# Patient Record
Sex: Female | Born: 1945 | Race: White | Hispanic: No | Marital: Married | Smoking: Former smoker
Health system: Southern US, Community
[De-identification: ages and names within clinical notes are randomized; demographics above are authoritative.]

## PROBLEM LIST (undated history)

## (undated) DIAGNOSIS — G4730 Sleep apnea, unspecified: Secondary | ICD-10-CM

## (undated) DIAGNOSIS — I959 Hypotension, unspecified: Secondary | ICD-10-CM

## (undated) DIAGNOSIS — I729 Aneurysm of unspecified site: Secondary | ICD-10-CM

## (undated) DIAGNOSIS — G20A1 Parkinson's disease without dyskinesia, without mention of fluctuations: Secondary | ICD-10-CM

## (undated) DIAGNOSIS — G20 Parkinson's disease: Secondary | ICD-10-CM

## (undated) DIAGNOSIS — H409 Unspecified glaucoma: Secondary | ICD-10-CM

## (undated) HISTORY — PX: CRANIOTOMY: SHX93

## (undated) HISTORY — PX: OTHER SURGICAL HISTORY: SHX169

## (undated) HISTORY — DX: Parkinson's disease: G20

## (undated) HISTORY — DX: Parkinson's disease without dyskinesia, without mention of fluctuations: G20.A1

---

## 1998-09-13 ENCOUNTER — Other Ambulatory Visit: Admission: RE | Admit: 1998-09-13 | Discharge: 1998-09-13 | Payer: Self-pay | Admitting: Obstetrics and Gynecology

## 1999-09-23 ENCOUNTER — Other Ambulatory Visit: Admission: RE | Admit: 1999-09-23 | Discharge: 1999-09-23 | Payer: Self-pay | Admitting: Obstetrics and Gynecology

## 2000-10-21 ENCOUNTER — Other Ambulatory Visit: Admission: RE | Admit: 2000-10-21 | Discharge: 2000-10-21 | Payer: Self-pay | Admitting: Obstetrics and Gynecology

## 2006-08-05 ENCOUNTER — Ambulatory Visit: Payer: Self-pay | Admitting: Gastroenterology

## 2006-09-07 ENCOUNTER — Ambulatory Visit: Payer: Self-pay | Admitting: Gastroenterology

## 2006-09-07 ENCOUNTER — Encounter (INDEPENDENT_AMBULATORY_CARE_PROVIDER_SITE_OTHER): Payer: Self-pay | Admitting: Specialist

## 2008-03-02 ENCOUNTER — Ambulatory Visit: Payer: Self-pay | Admitting: Obstetrics and Gynecology

## 2008-03-02 ENCOUNTER — Other Ambulatory Visit: Admission: RE | Admit: 2008-03-02 | Discharge: 2008-03-02 | Payer: Self-pay | Admitting: Obstetrics and Gynecology

## 2008-03-02 ENCOUNTER — Encounter: Payer: Self-pay | Admitting: Obstetrics and Gynecology

## 2009-11-14 ENCOUNTER — Emergency Department (HOSPITAL_COMMUNITY): Admission: EM | Admit: 2009-11-14 | Discharge: 2009-11-14 | Payer: Self-pay | Admitting: Family Medicine

## 2011-06-30 ENCOUNTER — Encounter: Payer: Self-pay | Admitting: Gastroenterology

## 2012-03-30 ENCOUNTER — Encounter: Payer: Self-pay | Admitting: Gastroenterology

## 2012-12-06 ENCOUNTER — Other Ambulatory Visit: Payer: Self-pay

## 2013-07-30 ENCOUNTER — Encounter (HOSPITAL_COMMUNITY): Payer: Self-pay | Admitting: Emergency Medicine

## 2013-07-30 ENCOUNTER — Emergency Department (HOSPITAL_COMMUNITY): Payer: Medicare PPO

## 2013-07-30 ENCOUNTER — Emergency Department (HOSPITAL_COMMUNITY)
Admission: EM | Admit: 2013-07-30 | Discharge: 2013-07-30 | Disposition: A | Discharge status: Home / Self Care | Payer: Medicare PPO | Attending: Emergency Medicine | Admitting: Emergency Medicine

## 2013-07-30 DIAGNOSIS — W010XXA Fall on same level from slipping, tripping and stumbling without subsequent striking against object, initial encounter: Secondary | ICD-10-CM | POA: Insufficient documentation

## 2013-07-30 DIAGNOSIS — Z880 Allergy status to penicillin: Secondary | ICD-10-CM | POA: Insufficient documentation

## 2013-07-30 DIAGNOSIS — H409 Unspecified glaucoma: Secondary | ICD-10-CM | POA: Insufficient documentation

## 2013-07-30 DIAGNOSIS — Z79899 Other long term (current) drug therapy: Secondary | ICD-10-CM | POA: Insufficient documentation

## 2013-07-30 DIAGNOSIS — Y939 Activity, unspecified: Secondary | ICD-10-CM | POA: Insufficient documentation

## 2013-07-30 DIAGNOSIS — W1809XA Striking against other object with subsequent fall, initial encounter: Secondary | ICD-10-CM | POA: Insufficient documentation

## 2013-07-30 DIAGNOSIS — H538 Other visual disturbances: Secondary | ICD-10-CM | POA: Insufficient documentation

## 2013-07-30 DIAGNOSIS — Y929 Unspecified place or not applicable: Secondary | ICD-10-CM | POA: Insufficient documentation

## 2013-07-30 DIAGNOSIS — Z8679 Personal history of other diseases of the circulatory system: Secondary | ICD-10-CM | POA: Insufficient documentation

## 2013-07-30 DIAGNOSIS — F0781 Postconcussional syndrome: Secondary | ICD-10-CM | POA: Insufficient documentation

## 2013-07-30 HISTORY — DX: Aneurysm of unspecified site: I72.9

## 2013-07-30 HISTORY — DX: Unspecified glaucoma: H40.9

## 2013-07-30 NOTE — ED Notes (Signed)
Dr. Bednar at bedside. 

## 2013-07-30 NOTE — ED Notes (Signed)
Patient transported to X-ray 

## 2013-07-30 NOTE — ED Notes (Signed)
Pt states slipped and fell yesterday landing and hitting posterior head.  No LOC.  Pt complains of tenderness to neck.  Pt reports sore head but no headache.  No vomiting.  Has history of aneurysm  With repair in 1982.  Pt reports started having intermittent blurred vision since falling yesterday.  Pt states when right eye open only she has blurry/double vision..  No vision deficit now.  No other deficits

## 2013-07-30 NOTE — ED Provider Notes (Signed)
CSN: [id number]     Arrival date & time 07/30/13  1430 History   First MD Initiated Contact with Patient 07/30/13 2053     Chief Complaint  Patient presents with  . Fall     (Consider location/radiation/quality/duration/timing/severity/associated sxs/prior Treatment) HPI Larey Seat yesterday hit head since then only when looks down and right has slight blurred vision if both eyes open (not with one eye at a time), without headache, neck pain, blindness, scotomas, or loss of peripheral vision. Also no back pain, weak, numb, incoordination, vertigo, CP, SOB, abdominal pain, nausea, vomiting, change in bowel/bladder function, difficulty walking, lacerations, or other concerns. No change in speech, swallow, understanding. Symptoms mild. No treatment PTA. Past Medical History  Diagnosis Date  . Aneurysm     brain with repair  . Glaucoma    Past Surgical History  Procedure Laterality Date  . Brain surgery    . Metal plate in head     No family history on file. History  Substance Use Topics  . Smoking status: Never Smoker   . Smokeless tobacco: Not on file  . Alcohol Use: Yes     Comment: occ   OB History   Grav Para Term Preterm Abortions TAB SAB Ect Mult Living                 Review of Systems 10 Systems reviewed and are negative for acute change except as noted in the HPI.   Allergies  Penicillins  Home Medications   Current Outpatient Rx  Name  Route  Sig  Dispense  Refill  . bimatoprost (LUMIGAN) 0.01 % SOLN   Both Eyes   Place 1 drop into both eyes at bedtime.         Marland Kitchen glucosamine-chondroitin 500-400 MG tablet   Oral   Take 1 tablet by mouth daily.         . Multiple Vitamins-Minerals (MULTIVITAMIN PO)   Oral   Take 1 tablet by mouth daily.         . naproxen sodium (ANAPROX) 220 MG tablet   Oral   Take 220 mg by mouth daily as needed (for pain).         . Omega-3 Fatty Acids (FISH OIL) 1000 MG CAPS   Oral   Take 1,000 mg by mouth daily.          . simvastatin (ZOCOR) 40 MG tablet   Oral   Take 40 mg by mouth daily.         . timolol (TIMOPTIC) 0.5 % ophthalmic solution   Both Eyes   Place 1 drop into both eyes every morning.         . zolpidem (AMBIEN) 5 MG tablet   Oral   Take 5 mg by mouth at bedtime as needed for sleep.          BP 140/67  Pulse 78  Temp(Src) 97.4 F (36.3 C) (Oral)  Resp 23  SpO2 96% Physical Exam  Nursing note and vitals reviewed. Constitutional:  Awake, alert, nontoxic appearance with baseline speech for patient.  HENT:  Mouth/Throat: No oropharyngeal exudate.  Minimally tender occipital scalp  Eyes: EOM are normal. Pupils are equal, round, and reactive to light. Right eye exhibits no discharge. Left eye exhibits no discharge.  Only when looks down and right with both eyes describes slight vague blurred vision without nystagmus or dysconjugate gaze noted  Neck: Neck supple.  Cardiovascular: Normal rate and regular rhythm.   No  murmur heard. Pulmonary/Chest: Effort normal and breath sounds normal. No stridor. No respiratory distress. She has no wheezes. She has no rales. She exhibits no tenderness.  Abdominal: Soft. Bowel sounds are normal. She exhibits no mass. There is no tenderness. There is no rebound.  Musculoskeletal: She exhibits no tenderness.  Baseline ROM, moves extremities with no obvious new focal weakness.  Lymphadenopathy:    She has no cervical adenopathy.  Neurological: She is alert.  Awake, alert, cooperative and aware of situation; motor strength bilaterally; sensation normal to light touch bilaterally; peripheral visual fields full to confrontation; no facial asymmetry; tongue midline; major cranial nerves appear intact; no pronator drift, normal finger to nose bilaterally, baseline gait without new ataxia.  Skin: No rash noted.  Psychiatric: She has a normal mood and affect.    ED Course  Procedures (including critical care time) D/w Neuro rec OutPt f/u. Patient /  Family / Caregiver informed of clinical course, understand medical decision-making process, and agree with plan.Pt stable in ED with no significant deterioration in condition. Labs Review Labs Reviewed - No data to display Imaging Review No results found.  EKG Interpretation   None       MDM   Final diagnoses:  Postconcussive syndrome    I doubt any other EMC precluding discharge at this time including, but not necessarily limited to the following:detached retina, ICH.    Hurman HornJohn M Monroe Toure, MD 08/02/13 [phone]

## 2013-07-30 NOTE — Discharge Instructions (Signed)
Concussion, Adult °A concussion is a brain injury. It is caused by: °· A hit to the head. °· A quick and sudden movement (jolt) of the head or neck. °A concussion is usually not life-threatening. Even so, it can cause serious problems. If you had a concussion before, you may have concussion-like problems after a hit to your head. °HOME CARE °General Instructions °· Follow your doctor's directions carefully. °· Take medicines only as told by your doctor. °· Only take medicines your doctor says are safe. °· Do not drink alcohol until your doctor says it is OK. Alcohol and some drugs can slow down healing. They can also put you at risk for further injury. °· If your are having trouble remembering things, write them down. °· Try to do one thing at a time if you get distracted easily. For example, do not watch TV while making dinner. °· Talk to your family members or close friends when making important decisions. °· Follow up with your doctor as told. °· Watch your symptoms. Tell others to do the same. Serious problems can sometimes happen after a concussion. Older adults are more likely to have these problems. °· Tell your teachers, school nurse, school counselor, coach, athletic trainer, or work manager about your concussion. Tell them about what you can or cannot do. They should watch to see if: °· It gets even harder for you to pay attention or concentrate. °· It gets even harder for you to remember things or learn new things. °· You need more time than normal to finish things. °· You become annoyed (irritable) more than before. °· You are not able to deal with stress as well. °· You have more problems than before. °· Rest. Make sure you: °· Get plenty of sleep at night. °· Go to sleep early. °· Go to bed at the same time every day. Try to wake up at the same time. °· Rest during the day. °· Take naps when you feel tired. °· Limit activities where you have to think a lot or concentrate. These include: °· Doing  homework. °· Doing work related to a job. °· Watching TV. °· Using the computer. °Returning To Your Regular Activities °Return to your normal activities slowly, not all at once. You must give your body and brain enough time to heal.  °· Do not play sports or do other athletic activities until your doctor says it is OK. °· Ask your doctor when you can drive, ride a bicycle, or work other vehicles or machines. Never do these things if you feel dizzy. °· Ask your doctor about when you can return to work or school. °Preventing Another Concussion °It is very important to avoid another brain injury, especially before you have healed. In rare cases, another injury can lead to permanent brain damage, brain swelling, or death. The risk of this is greatest during the first 7 10 after your injury. Avoid injuries by:  °· Wearing a seat belt when riding in a car. °· Not drinking too much alcohol. °· Avoiding activities that could lead to a second concussion (such as contact sports). °· Wearing a helmet when doing activities like: °· Biking. °· Skiing. °· Skateboarding. °· Skating. °· Making your home safer by: °· Removing things from the floor or stairways that could make you trip. °· Using grab bars in bathrooms and handrails by stairs. °· Placing non-slip mats on floors and in bathtubs. °· Improve lighting in dark areas. °GET HELP IF: °· It   gets even harder for you to pay attention or concentrate. °· It gets even harder for you to remember things or learn new things. °· You need more time than normal to finish things. °· You become annoyed (irritable) more than before. °· You are not able to deal with stress as well. °· You have more problems than before. °· You have problems keeping your balance. °· You are not able to react quickly when you should. °Get help if you have any of these problems for more than 2 weeks:  °· Lasting (chronic) headaches. °· Dizziness or trouble balancing. °· Feeling sick to your stomach  (nausea). °· Seeing (vision) problems. °· Being affected by noises or light more than normal. °· Feeling sad, low, down in the dumps, blue, gloomy, or empty (depressed). °· Mood changes (mood swings). °· Feeling of fear or nervousness about what may happen (anxiety). °· Feeling annoyed. °· Memory problems. °· Problems concentrating or paying attention. °· Sleep problems. °· Feeling tired all the time. °GET HELP RIGHT AWAY IF:  °· You have bad headaches or your headaches get worse. °· You have weakness (even if it is in one hand, leg, or part of the face). °· You have loss of feeling (numbness). °· You feel off balance. °· You keep throwing up (vomiting). °· You feel tired. °· One black center of your eye (pupil) is larger than the other. °· You twitch or shake violently (convulse). °· Your speech is not clear (slurred). °· You are more confused, easily angered (agitated), or annoyed than before. °· You have more trouble resting than before. °· You are unable to recognize people or places. °· You have neck pain. °· It is difficult to wake you up. °· You have unusual behavior changes. °· You pass out (lose consciousness). °MAKE SURE YOU:  °· Understand these instructions. °· Will watch your condition. °· Will get help right away if you are not doing well or get worse. °Document Released: 05/14/2009 Document Revised: 03/16/2013 Document Reviewed: 12/16/2012 °ExitCare® Patient Information ©2014 [employer]. °You have had a head injury which does not appear to require admission at this time. A concussion is a state of changed mental ability from trauma. °SEEK IMMEDIATE MEDICAL ATTENTION IF: °There is confusion or drowsiness (although children frequently become drowsy after injury).  °You cannot awaken the injured person.  °There is nausea (feeling sick to your stomach) or continued, forceful vomiting.  °You notice dizziness or unsteadiness which is getting worse, or inability to walk.  °You have convulsions or  unconsciousness.  °You experience severe, persistent headaches not relieved by Tylenol?. (Do not take aspirin as this impairs clotting abilities). Take other pain medications only as directed.  °You cannot use arms or legs normally.  °There are changes in pupil sizes. (This is the black center in the colored part of the eye)  °There is clear or bloody discharge from the nose or ears.  °Change in speech, vision, swallowing, or understanding.  °Localized weakness, numbness, tingling, or change in bowel or bladder control. °

## 2013-07-30 NOTE — ED Notes (Signed)
Pt placed in [place] collar for neck soreness

## 2013-09-20 ENCOUNTER — Encounter: Payer: Self-pay | Admitting: Neurology

## 2013-09-20 ENCOUNTER — Ambulatory Visit (INDEPENDENT_AMBULATORY_CARE_PROVIDER_SITE_OTHER): Payer: Medicare PPO | Admitting: Neurology

## 2013-09-20 VITALS — BP 128/70 | HR 68 | Resp 18 | Ht 68.0 in | Wt 165.8 lb

## 2013-09-20 DIAGNOSIS — G20 Parkinson's disease: Secondary | ICD-10-CM

## 2013-09-20 DIAGNOSIS — G20A1 Parkinson's disease without dyskinesia, without mention of fluctuations: Secondary | ICD-10-CM

## 2013-09-20 MED ORDER — CARBIDOPA-LEVODOPA 25-100 MG PO TABS: 1.0000 | ORAL_TABLET | Freq: Three times a day (TID) | ORAL | Status: DC

## 2013-09-20 NOTE — Patient Instructions (Signed)
1. Start Carbidopa-Levodopa as follows: 1/2 tab three times a day before meals x 1 wk, then 1/2 in am & noon & 1 in evening for a week, then 1/2 in am &1 at noon &one in evening for a week, then 1 tablet three times a day before meals 2. You have been referred to Neuro Rehab. They will call you directly to schedule an appointment.  Please call [phone] if you do not hear from them.  3. Follow up 8 weeks.  

## 2013-09-20 NOTE — Progress Notes (Signed)
Elizabeth Mcintosh was seen today in the movement disorders clinic for neurologic consultation at the request of [name],[name], MD.  The consultation is for the evaluation of abnormal gait and "awkwardness" and weakness on the R side.  Pt states that they were away in [place] and [place] in October, 2014 and she felt like she just didn't bounce back after that.  She just felt "slow" after.  In Jan, she went to ortho to get an injection in her shoulder b/c of an impingement syndrome but still felt slow after PT, although she did good in PT.  She tried to d/c all the meds that she thought that she thought would interfere and could cause slowness - benadryl and unisom.  She feels that her right arm doesn't swing.  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  Specific Symptoms:  Tremor: no Voice: no change Sleep: sleeps well with [place]  Vivid Dreams:  no  Acting out dreams:  no Wet Pillows: no Postural symptoms:  no  Falls?  no (one fall only and it was on ice) Bradykinesia symptoms: slow movements and difficulty getting out of a chair Loss of smell:  no Loss of taste:  no Urinary Incontinence:  no Difficulty Swallowing:  no Handwriting, micrographia: no Trouble with ADL's:  no  (able to do them but slower)  Trouble buttoning clothing: no Depression:  no Memory changes:  no Hallucinations:  no  visual distortions: no N/V:  no Lightheaded:  no  Syncope: no Diplopia:  no Dyskinesia:  no  Neuroimaging has  previously been performed.  It is available for my review today.  CT of the brain was done on 07/30/13 demonstrating encephalomalacia in the R frontal region and prior aneurysm clips.   Pt reports that the aneursym happened when she was pregnant and it ruptured.  PREVIOUS MEDICATIONS: none to date  ALLERGIES:   Allergies  Allergen Reactions  . Penicillins     *childhood allergy*    CURRENT MEDICATIONS:  Current Outpatient Prescriptions on File Prior to Visit   Medication Sig Dispense Refill  . bimatoprost (LUMIGAN) 0.01 % SOLN Place 1 drop into both eyes at bedtime.      Marland Kitchen glucosamine-chondroitin 500-400 MG tablet Take 1 tablet by mouth daily.      . Multiple Vitamins-Minerals (MULTIVITAMIN PO) Take 1 tablet by mouth daily.      . Omega-3 Fatty Acids (FISH OIL) 1000 MG CAPS Take 1,000 mg by mouth daily.      . timolol (TIMOPTIC) 0.5 % ophthalmic solution Place 1 drop into both eyes every morning.      . zolpidem (AMBIEN) 5 MG tablet Take 5 mg by mouth at bedtime as needed for sleep.      . naproxen sodium (ANAPROX) 220 MG tablet Take 220 mg by mouth daily as needed (for pain).      . simvastatin (ZOCOR) 40 MG tablet Take 40 mg by mouth daily.       No current facility-administered medications on file prior to visit.    PAST MEDICAL HISTORY:   Past Medical History  Diagnosis Date  . Aneurysm     s/p clips  . Glaucoma     PAST SURGICAL HISTORY:   Past Surgical History  Procedure Laterality Date  . Craniotomy    . Aneurym clipping      SOCIAL HISTORY:   History   Social History  . Marital Status: Married  Spouse Name: N/A    Number of Children: N/A  . Years of Education: N/A   Occupational History  . retired     Copycurator, [employer]   Social History Main Topics  . Smoking status: Former Games developermoker  . Smokeless tobacco: Not on file     Comment: quit 40 years ago  . Alcohol Use: Yes     Comment: 2-3 glasses wine/day (6-8 oz in each glass)  . Drug Use: No  . Sexual Activity: Not on file   Other Topics Concern  . Not on file   Social History Narrative  . No narrative on file    FAMILY HISTORY:   Family Status  Relation Status Death Age  . Mother Deceased [id number]    fall  . Father Deceased [id number]    CAD, renal failure  . Brother Deceased     CVA, MI after hip replacement  . Brother Alive     bladder CA  . Child Alive     healthy    ROS:  A complete 10 system review of systems was obtained and was unremarkable apart  from what is mentioned above.  PHYSICAL EXAMINATION:    VITALS:   Filed Vitals:   09/20/13 0949  BP: 128/70  Pulse: 68  Resp: 18  Height: 5\' 8"  (1.727 m)  Weight: 165 lb 12.8 oz (75.206 kg)    GEN:  The patient appears stated age and is in NAD. HEENT:  Normocephalic, atraumatic.  The mucous membranes are moist. The superficial temporal arteries are without ropiness or tenderness. CV:  RRR Lungs:  CTAB Neck/HEME:  There are no carotid bruits bilaterally.  Neurological examination:  Orientation: The patient is alert and oriented x3. Fund of knowledge is appropriate.  Recent and remote memory are intact.  Attention and concentration are normal.    Able to name objects and repeat phrases. Cranial nerves: There is good facial symmetry.  There is facial hypomimia.  Pupils are equal round and reactive to light bilaterally. Fundoscopic exam reveals clear margins bilaterally. Extraocular muscles are intact.  There are no square wave jerks.  The visual fields are full to confrontational testing. The speech is fluent and clear. Soft palate rises symmetrically and there is no tongue deviation. Hearing is intact to conversational tone. Sensation: Sensation is intact to light and pinprick throughout (facial, trunk, extremities). Vibration is intact at the bilateral big toe. There is no extinction with double simultaneous stimulation. There is no sensory dermatomal level identified. Motor: Strength is 5/5 in the bilateral upper and lower extremities.   Shoulder shrug is equal and symmetric.  There is no pronator drift. Deep tendon reflexes: Deep tendon reflexes are 2+/4 at the right biceps, triceps, brachioradialis, patella and achilles.  Deep tendon reflexes are 2- at the left biceps, triceps, brachioradialis, patella and Achilles.  Plantar responses are downgoing bilaterally.  Movement examination: Tone: There is moderate increased tone in the RUE.  Tone in the left upper extremity is normal.  Tone  in the bilateral lower extremities is normal. Abnormal movements: There is no tremor, even with distraction techniques. Coordination:  There is definite decremation with RAM's, seen with all forms of rapid alternating movements on the right, including alternating supination and pronation of the forearm, hand opening and closing, finger taps, heel taps and toe taps.  Rapid alternating movements on the left were good. Gait and Station: The patient has no significant difficulty arising out of a deep-seated chair without the use of  the hands. The patient's stride length is decreased with markedly decreased arm swing on the right.  The patient has a negative pull test.      ASSESSMENT/PLAN:  1.  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  -We discussed the diagnosis as well as pathophysiology of the disease.  We discussed treatment options as well as prognostic indicators.  Patient education was provided.  -Greater than 50% of the 80 minute visit was spent in counseling answering questions and talking about what to expect now as well as in the future.  We talked about medication options as well as potential future surgical options.  We talked about safety in the home.  Talked about the importance of cardiovascular exercise.  -After discussing both dopamine agonists as well as levodopa, we decided to add carbidopa/levodopa 25/100.  1/2 tab tid x 1 wk, then 1/2 in am & noon & 1 at night for a week, then 1/2 in am &1 at noon &night for a week, then 1 po tid.  Risks, benefits, side effects and alternative therapies were discussed.  The opportunity to ask questions was given and they were answered to the best of my ability.  The patient expressed understanding and willingness to follow the outlined treatment protocols.  -I will refer the patient [facility] Parkinson[facility], for PT/OT.  We talked about the importance of  cardiovascular exercise in Parkinson's disease.  -The Rainbow books from the [facility] were provided to the patient as patient education. 2.  F/u in 8 weeks.

## 2013-09-21 ENCOUNTER — Telehealth: Payer: Self-pay | Admitting: Neurology

## 2013-09-21 NOTE — Telephone Encounter (Signed)
Called pt to check on her after yesterdays visit as I knew that it was unexpected dx.  She asked several questions.  Pt was doing fine.  Told to call with further questions.

## 2013-10-10 ENCOUNTER — Ambulatory Visit: Payer: Medicare PPO | Attending: Neurology | Admitting: Physical Therapy

## 2013-10-10 DIAGNOSIS — Z5189 Encounter for other specified aftercare: Secondary | ICD-10-CM | POA: Insufficient documentation

## 2013-10-10 DIAGNOSIS — R269 Unspecified abnormalities of gait and mobility: Secondary | ICD-10-CM | POA: Insufficient documentation

## 2013-10-10 DIAGNOSIS — M6281 Muscle weakness (generalized): Secondary | ICD-10-CM | POA: Insufficient documentation

## 2013-10-10 DIAGNOSIS — R279 Unspecified lack of coordination: Secondary | ICD-10-CM | POA: Insufficient documentation

## 2013-10-10 DIAGNOSIS — M2560 Stiffness of unspecified joint, not elsewhere classified: Secondary | ICD-10-CM | POA: Diagnosis not present

## 2013-10-11 ENCOUNTER — Ambulatory Visit: Payer: Medicare PPO | Admitting: Physical Therapy

## 2013-10-12 ENCOUNTER — Ambulatory Visit: Payer: Medicare PPO | Admitting: Occupational Therapy

## 2013-10-12 ENCOUNTER — Ambulatory Visit: Payer: Medicare PPO | Admitting: Physical Therapy

## 2013-10-12 DIAGNOSIS — Z5189 Encounter for other specified aftercare: Secondary | ICD-10-CM | POA: Diagnosis not present

## 2013-10-18 ENCOUNTER — Ambulatory Visit: Payer: Medicare PPO | Admitting: Physical Therapy

## 2013-10-21 ENCOUNTER — Ambulatory Visit: Payer: Medicare PPO | Admitting: Physical Therapy

## 2013-10-24 ENCOUNTER — Ambulatory Visit: Payer: Medicare PPO | Admitting: Occupational Therapy

## 2013-10-24 ENCOUNTER — Ambulatory Visit: Payer: Medicare PPO | Admitting: Physical Therapy

## 2013-10-24 DIAGNOSIS — Z5189 Encounter for other specified aftercare: Secondary | ICD-10-CM | POA: Diagnosis not present

## 2013-10-26 ENCOUNTER — Ambulatory Visit: Payer: Medicare PPO | Admitting: Physical Therapy

## 2013-10-26 ENCOUNTER — Ambulatory Visit: Payer: Medicare PPO | Admitting: Occupational Therapy

## 2013-10-26 DIAGNOSIS — Z5189 Encounter for other specified aftercare: Secondary | ICD-10-CM | POA: Diagnosis not present

## 2013-11-01 ENCOUNTER — Ambulatory Visit: Payer: Medicare PPO | Admitting: Occupational Therapy

## 2013-11-01 ENCOUNTER — Ambulatory Visit: Payer: Medicare PPO | Admitting: Physical Therapy

## 2013-11-01 DIAGNOSIS — Z5189 Encounter for other specified aftercare: Secondary | ICD-10-CM | POA: Diagnosis not present

## 2013-11-03 ENCOUNTER — Ambulatory Visit: Payer: Medicare PPO | Admitting: Occupational Therapy

## 2013-11-03 ENCOUNTER — Ambulatory Visit: Payer: Medicare PPO | Admitting: Physical Therapy

## 2013-11-03 DIAGNOSIS — Z5189 Encounter for other specified aftercare: Secondary | ICD-10-CM | POA: Diagnosis not present

## 2013-11-07 ENCOUNTER — Ambulatory Visit: Payer: Medicare PPO | Admitting: Occupational Therapy

## 2013-11-07 ENCOUNTER — Ambulatory Visit: Payer: Medicare PPO | Attending: Neurology | Admitting: Physical Therapy

## 2013-11-07 DIAGNOSIS — M6281 Muscle weakness (generalized): Secondary | ICD-10-CM | POA: Diagnosis not present

## 2013-11-07 DIAGNOSIS — R279 Unspecified lack of coordination: Secondary | ICD-10-CM | POA: Diagnosis not present

## 2013-11-07 DIAGNOSIS — M2560 Stiffness of unspecified joint, not elsewhere classified: Secondary | ICD-10-CM | POA: Diagnosis not present

## 2013-11-07 DIAGNOSIS — R269 Unspecified abnormalities of gait and mobility: Secondary | ICD-10-CM | POA: Insufficient documentation

## 2013-11-07 DIAGNOSIS — Z5189 Encounter for other specified aftercare: Secondary | ICD-10-CM | POA: Insufficient documentation

## 2013-11-09 ENCOUNTER — Ambulatory Visit: Payer: Medicare PPO | Admitting: Occupational Therapy

## 2013-11-09 ENCOUNTER — Ambulatory Visit: Payer: Medicare PPO | Admitting: Physical Therapy

## 2013-11-09 DIAGNOSIS — Z5189 Encounter for other specified aftercare: Secondary | ICD-10-CM | POA: Diagnosis not present

## 2013-11-15 ENCOUNTER — Ambulatory Visit: Payer: Medicare PPO | Admitting: Neurology

## 2013-11-15 ENCOUNTER — Ambulatory Visit: Payer: Medicare PPO | Admitting: Physical Therapy

## 2013-11-15 ENCOUNTER — Ambulatory Visit: Payer: Medicare PPO | Admitting: Occupational Therapy

## 2013-11-15 DIAGNOSIS — Z5189 Encounter for other specified aftercare: Secondary | ICD-10-CM | POA: Diagnosis not present

## 2013-11-22 ENCOUNTER — Encounter: Payer: Self-pay | Admitting: Neurology

## 2013-11-22 ENCOUNTER — Ambulatory Visit (INDEPENDENT_AMBULATORY_CARE_PROVIDER_SITE_OTHER): Payer: Medicare PPO | Admitting: Neurology

## 2013-11-22 VITALS — BP 120/70 | HR 100 | Resp 16 | Ht 68.5 in | Wt 161.0 lb

## 2013-11-22 DIAGNOSIS — F329 Major depressive disorder, single episode, unspecified: Secondary | ICD-10-CM

## 2013-11-22 DIAGNOSIS — G20 Parkinson's disease: Secondary | ICD-10-CM

## 2013-11-22 DIAGNOSIS — G4700 Insomnia, unspecified: Secondary | ICD-10-CM

## 2013-11-22 DIAGNOSIS — F3289 Other specified depressive episodes: Secondary | ICD-10-CM

## 2013-11-22 DIAGNOSIS — F32A Depression, unspecified: Secondary | ICD-10-CM | POA: Insufficient documentation

## 2013-11-22 MED ORDER — CARBIDOPA-LEVODOPA 25-100 MG PO TABS: ORAL_TABLET | ORAL | Status: DC

## 2013-11-22 MED ORDER — CLONAZEPAM 0.5 MG PO TABS
0.5000 mg | ORAL_TABLET | Freq: Every day | ORAL | Status: DC
Start: 2013-11-22 — End: 2015-03-30

## 2013-11-22 NOTE — Progress Notes (Signed)
Elizabeth SpittleMary E Mcintosh was seen today in the movement disorders clinic for neurologic consultation at the request of [name],[name], MD.  The consultation is for the evaluation of abnormal gait and "awkwardness" and weakness on the R side.  Pt states that they were away in [place] and [place] in October, 2014 and she felt like she just didn't bounce back after that.  She just felt "slow" after.  In Jan, she went to ortho to get an injection in her shoulder b/c of an impingement syndrome but still felt slow after PT, although she did good in PT.  She tried to d/c all the meds that she thought that she thought would interfere and could cause slowness - benadryl and unisom.  She feels that her right arm doesn't swing.  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  11/22/13 update:  Pt returns today for f/u, accompanied by her daughter who supplements the history.  The patient was diagnosed with Parkinson's disease last visit.  We started levodopa.  The patient reports that she is moving easier.  Her friends noticed that she is more alert.  She did her physical therapies and is planning on starting a cardiovascular exercise program, but has not figured out how exactly to do that or what to do.  She and her daughter bring a long list of questions.  Her daughter asks me about recognition of depression.  The patient denies this, but her daughter states that she does not think that the patient would be able to recognize it, and her daughter thinks that there is some depression.  The patient is very active, especially in regards to travel.  She will be traveling to [place] in July and then will be traveling again overseas in October.  She asks me about refilling her Ambien for insomnia.  Her daughter asks me about drinking alcohol.  Last visit, the patient reported that she drank about 3 or 4 glasses of wine per night, each totaling 6-8 ounces.  No falls.  No hallucinations.  No lightheadedness.      Neuroimaging has  previously been performed.  It is available for my review today.  CT of the brain was done on 07/30/13 demonstrating encephalomalacia in the R frontal region and prior aneurysm clips.   Pt reports that the aneursym happened when she was pregnant and it ruptured.  PREVIOUS MEDICATIONS: none to date  ALLERGIES:   Allergies  Allergen Reactions  . Penicillins     *childhood allergy*    CURRENT MEDICATIONS:  Current Outpatient Prescriptions on File Prior to Visit  Medication Sig Dispense Refill  . bimatoprost (LUMIGAN) 0.01 % SOLN Place 1 drop into both eyes at bedtime.      . carbidopa-levodopa (SINEMET IR) 25-100 MG per tablet Take 1 tablet by mouth 3 (three) times daily.  90 tablet  5  . glucosamine-chondroitin 500-400 MG tablet Take 1 tablet by mouth daily.      . Omega-3 Fatty Acids (FISH OIL) 1000 MG CAPS Take 1,000 mg by mouth daily.      . timolol (TIMOPTIC) 0.5 % ophthalmic solution Place 1 drop into both eyes every morning.       No current facility-administered medications on file prior to visit.    PAST MEDICAL HISTORY:   Past Medical History  Diagnosis Date  . Aneurysm     s/p clips  . Glaucoma     PAST SURGICAL HISTORY:  Past Surgical History  Procedure Laterality Date  . Craniotomy    . Aneurym clipping      SOCIAL HISTORY:   History   Social History  . Marital Status: Married    Spouse Name: N/A    Number of Children: N/A  . Years of Education: N/A   Occupational History  . retired     Copycurator, [employer]   Social History Main Topics  . Smoking status: Former Games developermoker  . Smokeless tobacco: Not on file     Comment: quit 40 years ago  . Alcohol Use: Yes     Comment: 2-3 glasses wine/day (6-8 oz in each glass)  . Drug Use: No  . Sexual Activity: Not on file   Other Topics Concern  . Not on file   Social History Narrative  . No narrative on file    FAMILY HISTORY:   Family Status  Relation Status Death Age  . Mother  Deceased 6193    fall  . Father Deceased 6863    CAD, renal failure  . Brother Deceased     CVA, MI after hip replacement  . Brother Alive     bladder CA  . Child Alive     healthy    ROS:  A complete 10 system review of systems was obtained and was unremarkable apart from what is mentioned above.  PHYSICAL EXAMINATION:    VITALS:   Filed Vitals:   11/22/13 0934  BP: 120/70  Pulse: 100  Resp: 16  Height: 5' 8.5" (1.74 m)  Weight: 161 lb (73.029 kg)    GEN:  The patient appears stated age and is in NAD. HEENT:  Normocephalic, atraumatic.  The mucous membranes are moist. The superficial temporal arteries are without ropiness or tenderness. CV:  RRR Lungs:  CTAB Neck/HEME:  There are no carotid bruits bilaterally.  Neurological examination:  Orientation: The patient is alert and oriented x3. Fund of knowledge is appropriate.  Recent and remote memory are intact.  Attention and concentration are normal.    Able to name objects and repeat phrases. Cranial nerves: There is good facial symmetry.  There is facial hypomimia.  Pupils are equal round and reactive to light bilaterally. Fundoscopic exam reveals clear margins bilaterally. Extraocular muscles are intact.  There are no square wave jerks.  The visual fields are full to confrontational testing. The speech is fluent and clear. Soft palate rises symmetrically and there is no tongue deviation. Hearing is intact to conversational tone. Sensation: Sensation is intact to light and pinprick throughout (facial, trunk, extremities). Vibration is intact at the bilateral big toe. There is no extinction with double simultaneous stimulation. There is no sensory dermatomal level identified. Motor: Strength is 5/5 in the bilateral upper and lower extremities.   Shoulder shrug is equal and symmetric.  There is no pronator drift. Deep tendon reflexes: Deep tendon reflexes are 2+/4 at the right biceps, triceps, brachioradialis, patella and achilles.   Deep tendon reflexes are 2- at the left biceps, triceps, brachioradialis, patella and Achilles.  Plantar responses are downgoing bilaterally.  Movement examination: Tone: There is moderate increased tone in the RUE.  Tone in the left upper extremity is normal.  Tone in the bilateral lower extremities is normal. Abnormal movements: There is no tremor, even with distraction techniques. Coordination:  There is definite decremation with RAM's, seen with all forms of rapid alternating movements on the right, including alternating supination and pronation of the forearm, hand opening and  closing, finger taps, heel taps and toe taps.  Rapid alternating movements on the left were good. Gait and Station: The patient has no significant difficulty arising out of a deep-seated chair without the use of the hands. The patient's stride length is decreased with markedly decreased arm swing on the right.  The patient has a negative pull test.      ASSESSMENT/PLAN:  1.  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  -She is still fairly rigid, even with the addition of levodopa.  I decided to try an increase in levodopa today to 2 tablets in the morning, with one in the afternoon and one in the evening.  I wanted to add Mirapex, but we decided not to do that right before she is going to [place].  We may consider this next visit.  -I talked to her extensively about the importance of safe, cardiovascular exercise.  Greater than 50% of the 45 min visit was spent in counseling.  -I. asked her to decrease her alcohol intake from 3-4 glasses per night to no more than 3 glasses of wine per week. 2.  possible depression.  -Patient denies this, but her daughter thinks that perhaps she is depressed.  She will be referred for neuropsychologic testing. 3.  Insomnia.  -I do not recommend Ambien she is currently using.  This will be discontinued.  Will start low-dose clonazepam,  0.5 mg, half tablet at night.  Risks, benefits, side effects and alternative therapies were discussed.  The opportunity to ask questions was given and they were answered to the best of my ability.  The patient expressed understanding and willingness to follow the outlined treatment protocols. 4.  F/u in sept, sooner should new neuro issues arise.

## 2013-11-22 NOTE — Patient Instructions (Addendum)
1. You have been referred to Dr Ron ParkerKaren Sullivan for Neuro Psych testing. They will call you directly to schedule an appointment. Please call [phone] if you do not hear from them.  2. Stop Ambien. Start Clonazepam 0.5 mg tablets. Take 1/2 - 1 tablet at bedtime. This prescription has been sent to your pharmacy.  3. Increase Carbidopa Levodopa to 2 tablets in the morning, 1 tablet in the afternoon, 1 in the evening. This prescription has been sent to your pharmacy.  4. Limit wine to 3 glasses per week.  5. Follow up end of September.

## 2013-11-23 ENCOUNTER — Telehealth: Payer: Self-pay | Admitting: Neurology

## 2013-11-23 NOTE — Telephone Encounter (Signed)
Notes faxed to Dr Ron ParkerKaren Sullivan for neuro psych testing for depression to fax number [phone] with confirmation received.

## 2013-11-29 ENCOUNTER — Other Ambulatory Visit: Payer: Self-pay

## 2013-12-05 ENCOUNTER — Other Ambulatory Visit: Payer: Medicare PPO

## 2013-12-06 ENCOUNTER — Telehealth: Payer: Self-pay | Admitting: Neurology

## 2013-12-06 NOTE — Telephone Encounter (Signed)
Left message on machine for patient to call back.

## 2013-12-06 NOTE — Telephone Encounter (Signed)
Going on vacation soon, will need a refill of Klonopin to get her thru vacation. Pharmacy requires provider approval since this will put her refilling early. CB# [phone]. Pharmacy - [facility] on [address] [phone] / Sherri S.

## 2013-12-07 NOTE — Telephone Encounter (Signed)
Spoke with patient and she is leaving Friday to go out of town for 5 weeks. A verbal okay given to pharmacy to refill her RX early to have available on her vacation and she will call with any problems or questions.

## 2014-02-22 ENCOUNTER — Ambulatory Visit (INDEPENDENT_AMBULATORY_CARE_PROVIDER_SITE_OTHER): Payer: Medicare PPO | Admitting: Neurology

## 2014-02-22 ENCOUNTER — Encounter: Payer: Self-pay | Admitting: Neurology

## 2014-02-22 VITALS — BP 88/52 | HR 90 | Ht 68.5 in | Wt 160.0 lb

## 2014-02-22 DIAGNOSIS — F32A Depression, unspecified: Secondary | ICD-10-CM

## 2014-02-22 DIAGNOSIS — F3289 Other specified depressive episodes: Secondary | ICD-10-CM

## 2014-02-22 DIAGNOSIS — R351 Nocturia: Secondary | ICD-10-CM

## 2014-02-22 DIAGNOSIS — F329 Major depressive disorder, single episode, unspecified: Secondary | ICD-10-CM

## 2014-02-22 DIAGNOSIS — F1010 Alcohol abuse, uncomplicated: Secondary | ICD-10-CM

## 2014-02-22 DIAGNOSIS — G20 Parkinson's disease: Secondary | ICD-10-CM

## 2014-02-22 MED ORDER — PRAMIPEXOLE DIHYDROCHLORIDE 0.125 MG PO TABS: ORAL_TABLET | ORAL | Status: DC

## 2014-02-22 MED ORDER — PRAMIPEXOLE DIHYDROCHLORIDE 0.5 MG PO TABS: 0.5000 mg | ORAL_TABLET | Freq: Three times a day (TID) | ORAL | Status: DC

## 2014-02-22 NOTE — Patient Instructions (Addendum)
1.  Start mirapex (pramipexole) as follows:  0.125 mg - 1 tablet three times per day for a week, then 2 tablets three times per day for a week and then fill the 0.5 mg tablet and take that, 1 pill three times per day 2. [facility] - Dr Henderson Cloud - is a Warehouse manager. They are located at [address], [address] [place] [address]  And can be contacted at [phone]. Due to your insurance - this referral has to be made by your primary care physician.  3. Limit alcohol to twice weekly.  4. Follow up in January.

## 2014-02-22 NOTE — Addendum Note (Signed)
Addended byLiliana Cline L on: 02/22/2014 11:04 AM   Modules accepted: Orders

## 2014-02-22 NOTE — Addendum Note (Signed)
Addended by: Zackary Mckeone L on: 02/22/2014 11:04 AM   Modules accepted: Orders  

## 2014-02-22 NOTE — Progress Notes (Signed)
Elizabeth Mcintosh was seen today in the movement disorders clinic for neurologic consultation at the request of [name],[name], MD.  The consultation is for the evaluation of abnormal gait and "awkwardness" and weakness on the R side.  Pt states that they were away in [place] and [place] in October, 2014 and she felt like she just didn't bounce back after that.  She just felt "slow" after.  In Jan, she went to ortho to get an injection in her shoulder b/c of an impingement syndrome but still felt slow after PT, although she did good in PT.  She tried to d/c all the meds that she thought that she thought would interfere and could cause slowness - benadryl and unisom.  She feels that her right arm doesn't swing.  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  11/22/13 update:  Pt returns today for f/u, accompanied by her daughter who supplements the history.  The patient was diagnosed with Parkinson's disease last visit.  We started levodopa.  The patient reports that she is moving easier.  Her friends noticed that she is more alert.  She did her physical therapies and is planning on starting a cardiovascular exercise program, but has not figured out how exactly to do that or what to do.  She and her daughter bring a long list of questions.  Her daughter asks me about recognition of depression.  The patient denies this, but her daughter states that she does not think that the patient would be able to recognize it, and her daughter thinks that there is some depression.  The patient is very active, especially in regards to travel.  She will be traveling to [place] in July and then will be traveling again overseas in October.  She asks me about refilling her Ambien for insomnia.  Her daughter asks me about drinking alcohol.  Last visit, the patient reported that she drank about 3 or 4 glasses of wine per night, each totaling 6-8 ounces.  No falls.  No hallucinations.  No lightheadedness.    02/22/14  update:  Pt accompanied by her son, who supplements the history.  Pt saw Dr. Lendell Caprice since last visit.  No evidence of dementia.  Believed had adjustment d/o related to dx of PD and pt agreed to go to counseling with Dr. Lendell Caprice.  Dr. Lendell Caprice also discussed with her tapering alcohol use, as I have also.  Pt states that she was told that she could still have 3 drinks per night.   She has increased her carbidopa/levodopa 25/100 to 2 in the AM, 1 in the afternoon, 1 in the evening.  Pt states that she has more energy after going up in the AM.  Her BP was really low today, and she states that it "is always low."  She is sometimes dizzy but it is not bad.  She is getting up in the middle of the night frequently to use the bathroom and cannot sleep.  She wonders if the klonopin contributes to that.  She is exercising more; went 5 days last week.     Neuroimaging has  previously been performed.  It is available for my review today.  CT of the brain was done on 07/30/13 demonstrating encephalomalacia in the R frontal region and prior aneurysm clips.   Pt reports that the aneursym happened when she was pregnant and it ruptured.  PREVIOUS MEDICATIONS: none to date  ALLERGIES:  Allergies  Allergen Reactions  . Penicillins     *childhood allergy*    CURRENT MEDICATIONS:  Current Outpatient Prescriptions on File Prior to Visit  Medication Sig Dispense Refill  . bimatoprost (LUMIGAN) 0.01 % SOLN Place 1 drop into both eyes at bedtime.      . carbidopa-levodopa (SINEMET IR) 25-100 MG per tablet Take 2 tablets in the am, 1 tablet at noon, 1 in the evening  120 tablet  5  . clonazePAM (KLONOPIN) 0.5 MG tablet Take 1 tablet (0.5 mg total) by mouth at bedtime.  30 tablet  5  . glucosamine-chondroitin 500-400 MG tablet Take 1 tablet by mouth daily.      . Omega-3 Fatty Acids (FISH OIL) 1000 MG CAPS Take 1,000 mg by mouth daily.      . timolol (TIMOPTIC) 0.5 % ophthalmic solution Place 1 drop into both eyes  every morning.       No current facility-administered medications on file prior to visit.    PAST MEDICAL HISTORY:   Past Medical History  Diagnosis Date  . Aneurysm     s/p clips  . Glaucoma     PAST SURGICAL HISTORY:   Past Surgical History  Procedure Laterality Date  . Craniotomy    . Aneurym clipping      SOCIAL HISTORY:   History   Social History  . Marital Status: Married    Spouse Name: N/A    Number of Children: N/A  . Years of Education: N/A   Occupational History  . retired     Copy, Music therapist   Social History Main Topics  . Smoking status: Former Games developer  . Smokeless tobacco: Not on file     Comment: quit 40 years ago  . Alcohol Use: Yes     Comment: 2-3 glasses wine/day (6-8 oz in each glass)  . Drug Use: No  . Sexual Activity: Not on file   Other Topics Concern  . Not on file   Social History Narrative  . No narrative on file    FAMILY HISTORY:   Family Status  Relation Status Death Age  . Mother Deceased 61    fall  . Father Deceased 35    CAD, renal failure  . Brother Deceased     CVA, MI after hip replacement  . Brother Alive     bladder CA  . Child Alive     healthy    ROS:  A complete 10 system review of systems was obtained and was unremarkable apart from what is mentioned above.  PHYSICAL EXAMINATION:    VITALS:   Filed Vitals:   02/22/14 0926  BP: 88/52  Pulse: 90  Height: 5' 8.5" (1.74 m)  Weight: 160 lb (72.576 kg)    GEN:  The patient appears stated age and is in NAD. HEENT:  Normocephalic, atraumatic.  The mucous membranes are moist. The superficial temporal arteries are without ropiness or tenderness. CV:  RRR Lungs:  CTAB Neck/HEME:  There are no carotid bruits bilaterally.  Neurological examination:  Orientation: The patient is alert and oriented x3. Fund of knowledge is appropriate.  Recent and remote memory are intact.  Attention and concentration are normal.    Able to name objects and repeat  phrases. Cranial nerves: There is good facial symmetry.  There is facial hypomimia.  Pupils are equal round and reactive to light bilaterally. Fundoscopic exam reveals clear margins bilaterally. Extraocular muscles are intact.  There are no square wave  jerks.  The visual fields are full to confrontational testing. The speech is fluent and clear. Soft palate rises symmetrically and there is no tongue deviation. Hearing is intact to conversational tone. Sensation: Sensation is intact to light and pinprick throughout (facial, trunk, extremities). Vibration is intact at the bilateral big toe. There is no extinction with double simultaneous stimulation. There is no sensory dermatomal level identified. Motor: Strength is 5/5 in the bilateral upper and lower extremities.   Shoulder shrug is equal and symmetric.  There is no pronator drift. Deep tendon reflexes: Deep tendon reflexes are 2+/4 at the right biceps, triceps, brachioradialis, patella and achilles.  Deep tendon reflexes are 2- at the left biceps, triceps, brachioradialis, patella and Achilles.  Plantar responses are downgoing bilaterally.  Movement examination: Tone: There is mild-moderate increased tone in the RUE.  Tone in the left upper extremity is normal.  Tone in the bilateral lower extremities is normal. Abnormal movements: There is no tremor, even with distraction techniques. Coordination:  There is definite decremation with RAM's, seen with all forms of rapid alternating movements on the right, including alternating supination and pronation of the forearm, hand opening and closing, finger taps, heel taps and toe taps.  Rapid alternating movements on the left were good. Gait and Station: The patient has no significant difficulty arising out of a deep-seated chair without the use of the hands. The patient's stride length is decreased decreased arm swing on the right.  The patient has a negative pull test.      ASSESSMENT/PLAN:  1.  Akinetic  rigid Parkinson's disease, diagnosed today, 09/20/2013 with symptoms since October, 2014.  -She is still fairly rigid, even with the addition of levodopa.  Continue carbidopa/levodopa 25/100 2 tablets in the morning, with one in the afternoon and one in the evening.  Add mirapex and work up to 0.5 mg tid.  Risks, benefits, side effects and alternative therapies were discussed.  The opportunity to ask questions was given and they were answered to the best of my ability.  The patient expressed understanding and willingness to follow the outlined treatment protocols.  -continue exercise  -I stressed again that I want her to decrease her alcohol intake from 3 glasses per night to no more than 2-3 glasses of wine per week. 2.  Adjustment d/o  -seeing Dr. Lendell Caprice for counseling 3.  Urinary frequency  -refer to uro-gyn.  Prefer to avoid anticholinergic bladder meds. 4.  Return in about 4 months (around 06/24/2014).

## 2014-03-23 ENCOUNTER — Ambulatory Visit: Payer: Medicare PPO | Admitting: Neurology

## 2014-04-19 ENCOUNTER — Telehealth: Payer: Self-pay | Admitting: Neurology

## 2014-04-19 DIAGNOSIS — G20 Parkinson's disease: Secondary | ICD-10-CM

## 2014-04-19 DIAGNOSIS — R29818 Other symptoms and signs involving the nervous system: Secondary | ICD-10-CM

## 2014-04-19 NOTE — Telephone Encounter (Signed)
Pt says she had an odd experience this morning, describes it as an aura w/o migraine or something related to low blood pressure. Please call at confirmed CB# [phone] / Sherri S.

## 2014-04-20 NOTE — Telephone Encounter (Signed)
Spoke with patient and she states yesterday she had an episode of aura she describes as a change in her vision where everything is bright with a small amount of accompanying dizziness. She states this has happened in the past but only lasted about 5 seconds. This episode lasted about 7-10 minutes. No other symptoms. Made aware Dr Tat is out of town, but I would make her aware and if she was concerned  we would call her back after she returns. But episode is over and she has not had any since. She is back on her Mirapex, which she started back on 04/09/2014. She is currently taking Clonazepam 0.5 mg - 1 1/2 tablets nightly. She states this helps some with her sleep, but not helping much and she is concerned with taking this and having glaucoma. Please advise.

## 2014-04-20 NOTE — Telephone Encounter (Signed)
Pt is calling back since she did not hear anything yesterday please call [phone] please call back after 3 oclock

## 2014-04-24 NOTE — Telephone Encounter (Signed)
It won't affect her vision and the episode was unlikely related to mirapex in any way.  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

## 2014-04-25 NOTE — Telephone Encounter (Signed)
[facility] Please call back at [phone] / Sherri S.

## 2014-04-25 NOTE — Telephone Encounter (Signed)
Left message on machine for patient to call back.

## 2014-04-26 NOTE — Telephone Encounter (Signed)
Left another message on machine for patient to call back.

## 2014-04-26 NOTE — Telephone Encounter (Signed)
Spoke with patient and she can not have MR due to intracranial clips. Per Dr Tat okay to do CT head without contrast. Patient states had a scan in February 2015 (results in [facility])  Dr Tat - do you think she needs a repeat CT? If not will set up carotid ultrasound for 11/25 or anytime the following week. She is okay with me leaving appt information on her voicemail.

## 2014-04-27 NOTE — Telephone Encounter (Signed)
Yes as these sx's are apparently new

## 2014-04-27 NOTE — Telephone Encounter (Signed)
Studies scheduled at [facility] - [address] on 05/08/2014 at 2:00 pm to arrive 20 minutes early.

## 2014-04-27 NOTE — Telephone Encounter (Signed)
Patient made aware of appt date/times.

## 2014-05-08 ENCOUNTER — Ambulatory Visit
Admission: RE | Admit: 2014-05-08 | Discharge: 2014-05-08 | Disposition: A | Discharge status: Home / Self Care | Payer: Medicare PPO | Source: Ambulatory Visit | Attending: Neurology | Admitting: Neurology

## 2014-05-08 DIAGNOSIS — G20 Parkinson's disease: Secondary | ICD-10-CM

## 2014-05-08 DIAGNOSIS — R29818 Other symptoms and signs involving the nervous system: Secondary | ICD-10-CM

## 2014-05-08 DIAGNOSIS — G20A1 Parkinson's disease without dyskinesia, without mention of fluctuations: Secondary | ICD-10-CM

## 2014-05-09 ENCOUNTER — Telehealth: Payer: Self-pay | Admitting: Neurology

## 2014-05-09 NOTE — Telephone Encounter (Signed)
-----   Message from Octaviano Battyebecca S Tat, DO sent at 05/08/2014  4:19 PM EST ----- Let pt know that u/s looks okay

## 2014-05-09 NOTE — Telephone Encounter (Signed)
Patient made aware Carotid US okay and no change on CT head. She will call with any questions.

## 2014-05-10 ENCOUNTER — Encounter: Payer: Self-pay | Admitting: Gynecology

## 2014-05-10 ENCOUNTER — Ambulatory Visit (INDEPENDENT_AMBULATORY_CARE_PROVIDER_SITE_OTHER): Payer: Medicare PPO | Admitting: Gynecology

## 2014-05-10 VITALS — BP 114/66 | Ht 68.0 in | Wt 163.0 lb

## 2014-05-10 DIAGNOSIS — R351 Nocturia: Secondary | ICD-10-CM

## 2014-05-10 DIAGNOSIS — Z124 Encounter for screening for malignant neoplasm of cervix: Secondary | ICD-10-CM

## 2014-05-10 DIAGNOSIS — N952 Postmenopausal atrophic vaginitis: Secondary | ICD-10-CM

## 2014-05-10 NOTE — Addendum Note (Signed)
Addended by: Dayna BarkerGARDNER, KIMBERLY K on: 05/10/2014 11:32 AM   Modules accepted: Orders

## 2014-05-10 NOTE — Patient Instructions (Signed)
You may obtain a copy of any labs that were done today by logging onto MyChart as outlined in the instructions provided with your AVS (after visit summary). The office will not call with normal lab results but certainly if there are any significant abnormalities then we will contact you.   Health Maintenance, Female A healthy lifestyle and preventative care can promote health and wellness.  Maintain regular health, dental, and eye exams.  Eat a healthy diet. Foods like vegetables, fruits, whole grains, low-fat dairy products, and lean protein foods contain the nutrients you need without too many calories. Decrease your intake of foods high in solid fats, added sugars, and salt. Get information about a proper diet from your caregiver, if necessary.  Regular physical exercise is one of the most important things you can do for your health. Most adults should get at least 150 minutes of moderate-intensity exercise (any activity that increases your heart rate and causes you to sweat) each week. In addition, most adults need muscle-strengthening exercises on 2 or more days a week.   Maintain a healthy weight. The body mass index (BMI) is a screening tool to identify possible weight problems. It provides an estimate of body fat based on height and weight. Your caregiver can help determine your BMI, and can help you achieve or maintain a healthy weight. For adults 20 years and older:  A BMI below 18.5 is considered underweight.  A BMI of 18.5 to 24.9 is normal.  A BMI of 25 to 29.9 is considered overweight.  A BMI of 30 and above is considered obese.  Maintain normal blood lipids and cholesterol by exercising and minimizing your intake of saturated fat. Eat a balanced diet with plenty of fruits and vegetables. Blood tests for lipids and cholesterol should begin at age 61 and be repeated every 5 years. If your lipid or cholesterol levels are high, you are over 50, or you are a high risk for heart  disease, you may need your cholesterol levels checked more frequently.Ongoing high lipid and cholesterol levels should be treated with medicines if diet and exercise are not effective.  If you smoke, find out from your caregiver how to quit. If you do not use tobacco, do not start.  Lung cancer screening is recommended for adults aged 33 80 years who are at high risk for developing lung cancer because of a history of smoking. Yearly low-dose computed tomography (CT) is recommended for people who have at least a 30-pack-year history of smoking and are a current smoker or have quit within the past 15 years. A pack year of smoking is smoking an average of 1 pack of cigarettes a day for 1 year (for example: 1 pack a day for 30 years or 2 packs a day for 15 years). Yearly screening should continue until the smoker has stopped smoking for at least 15 years. Yearly screening should also be stopped for people who develop a health problem that would prevent them from having lung cancer treatment.  If you are pregnant, do not drink alcohol. If you are breastfeeding, be very cautious about drinking alcohol. If you are not pregnant and choose to drink alcohol, do not exceed 1 drink per day. One drink is considered to be 12 ounces (355 mL) of beer, 5 ounces (148 mL) of wine, or 1.5 ounces (44 mL) of liquor.  Avoid use of street drugs. Do not share needles with anyone. Ask for help if you need support or instructions about stopping  the use of drugs.  High blood pressure causes heart disease and increases the risk of stroke. Blood pressure should be checked at least every 1 to 2 years. Ongoing high blood pressure should be treated with medicines, if weight loss and exercise are not effective.  If you are 59 to 67 years old, ask your caregiver if you should take aspirin to prevent strokes.  Diabetes screening involves taking a blood sample to check your fasting blood sugar level. This should be done once every 3  years, after age 91, if you are within normal weight and without risk factors for diabetes. Testing should be considered at a younger age or be carried out more frequently if you are overweight and have at least 1 risk factor for diabetes.  Breast cancer screening is essential preventative care for women. You should practice "breast self-awareness." This means understanding the normal appearance and feel of your breasts and may include breast self-examination. Any changes detected, no matter how small, should be reported to a caregiver. Women in their 66s and 30s should have a clinical breast exam (CBE) by a caregiver as part of a regular health exam every 1 to 3 years. After age 101, women should have a CBE every year. Starting at age 100, women should consider having a mammogram (breast X-ray) every year. Women who have a family history of breast cancer should talk to their caregiver about genetic screening. Women at a high risk of breast cancer should talk to their caregiver about having an MRI and a mammogram every year.  Breast cancer gene (BRCA)-related cancer risk assessment is recommended for women who have family members with BRCA-related cancers. BRCA-related cancers include breast, ovarian, tubal, and peritoneal cancers. Having family members with these cancers may be associated with an increased risk for harmful changes (mutations) in the breast cancer genes BRCA1 and BRCA2. Results of the assessment will determine the need for genetic counseling and BRCA1 and BRCA2 testing.  The Pap test is a screening test for cervical cancer. Women should have a Pap test starting at age 57. Between ages 25 and 35, Pap tests should be repeated every 2 years. Beginning at age 37, you should have a Pap test every 3 years as long as the past 3 Pap tests have been normal. If you had a hysterectomy for a problem that was not cancer or a condition that could lead to cancer, then you no longer need Pap tests. If you are  between ages 50 and 76, and you have had normal Pap tests going back 10 years, you no longer need Pap tests. If you have had past treatment for cervical cancer or a condition that could lead to cancer, you need Pap tests and screening for cancer for at least 20 years after your treatment. If Pap tests have been discontinued, risk factors (such as a new sexual partner) need to be reassessed to determine if screening should be resumed. Some women have medical problems that increase the chance of getting cervical cancer. In these cases, your caregiver may recommend more frequent screening and Pap tests.  The human papillomavirus (HPV) test is an additional test that may be used for cervical cancer screening. The HPV test looks for the virus that can cause the cell changes on the cervix. The cells collected during the Pap test can be tested for HPV. The HPV test could be used to screen women aged 44 years and older, and should be used in women of any age  who have unclear Pap test results. After the age of 55, women should have HPV testing at the same frequency as a Pap test.  Colorectal cancer can be detected and often prevented. Most routine colorectal cancer screening begins at the age of 44 and continues through age 20. However, your caregiver may recommend screening at an earlier age if you have risk factors for colon cancer. On a yearly basis, your caregiver may provide home test kits to check for hidden blood in the stool. Use of a small camera at the end of a tube, to directly examine the colon (sigmoidoscopy or colonoscopy), can detect the earliest forms of colorectal cancer. Talk to your caregiver about this at age 86, when routine screening begins. Direct examination of the colon should be repeated every 5 to 10 years through age 13, unless early forms of pre-cancerous polyps or small growths are found.  Hepatitis C blood testing is recommended for all people born from [birth year] through 1964 and any  individual with known risks for hepatitis C.  Practice safe sex. Use condoms and avoid high-risk sexual practices to reduce the spread of sexually transmitted infections (STIs). Sexually active women aged 36 and younger should be checked for Chlamydia, which is a common sexually transmitted infection. Older women with new or multiple partners should also be tested for Chlamydia. Testing for other STIs is recommended if you are sexually active and at increased risk.  Osteoporosis is a disease in which the bones lose minerals and strength with aging. This can result in serious bone fractures. The risk of osteoporosis can be identified using a bone density scan. Women ages 20 and over and women at risk for fractures or osteoporosis should discuss screening with their caregivers. Ask your caregiver whether you should be taking a calcium supplement or vitamin D to reduce the rate of osteoporosis.  Menopause can be associated with physical symptoms and risks. Hormone replacement therapy is available to decrease symptoms and risks. You should talk to your caregiver about whether hormone replacement therapy is right for you.  Use sunscreen. Apply sunscreen liberally and repeatedly throughout the day. You should seek shade when your shadow is shorter than you. Protect yourself by wearing long sleeves, pants, a wide-brimmed hat, and sunglasses year round, whenever you are outdoors.  Notify your caregiver of new moles or changes in moles, especially if there is a change in shape or color. Also notify your caregiver if a mole is larger than the size of a pencil eraser.  Stay current with your immunizations. Document Released: 12/09/2010 Document Revised: 09/20/2012 Document Reviewed: 12/09/2010 [facility] Patient Information 2014 [employer].

## 2014-05-10 NOTE — Progress Notes (Signed)
Elizabeth Mcintosh 08/25/1945 [id number]        68 y.o.  G2P2 for follow up exam. Has not been seen for several years. Former patient of Dr. Eda PaschalGottsegen. Several issues noted below.  Past medical history,surgical history, problem list, medications, allergies, family history and social history were all reviewed and documented as reviewed in the EPIC chart.  ROS:  12 system ROS performed with pertinent positives and negatives included in the history, assessment and plan.   Additional significant findings :  none   Exam: [name] person[facility] Filed Vitals:   05/10/14 1032  BP: 114/66  Height: 5\' 8"  (1.727 m)  Weight: 163 lb (73.936 kg)   General appearance:  Normal affect, orientation and appearance. Skin: Grossly normal HEENT: Without gross lesions.  No cervical or supraclavicular adenopathy. Thyroid normal.  Lungs:  Clear without wheezing, rales or rhonchi Cardiac: RR, without RMG Abdominal:  Soft, nontender, without masses, guarding, rebound, organomegaly or hernia Breasts:  Examined lying and sitting without masses, retractions, discharge or axillary adenopathy. Pelvic:  Ext/BUS/vagina generalized atrophic changes  Cervix atrophic. Pap done  Uterus anteverted, normal size, shape and contour, midline and mobile nontender   Adnexa  Without masses or tenderness    Anus and perineum  Normal   Rectovaginal  Normal sphincter tone without palpated masses or tenderness.    Assessment/Plan:  68 y.o. G2P2 female for follow up exam.   1. Postmenopausal/atrophic genital changes. Patient without significant symptoms of hot flashes, night sweats, vaginal dryness or dyspareunia. No vaginal bleeding. Continue to monitor. Report any vaginal bleeding. 2. Nocturia. Patient notes over the past year she is getting up several times nightly to void. She does void good amounts when she gets up. Does note though over the last month or so this seems to have resolved. Check baseline urinalysis. Assuming negative follow up  if this continues to be an issue. 3. Pap smear 2009. Pap smear done today. No history of abnormal Pap smears previously. Options to stop screening altogether as she is over the age of 67 reviewed. Will readdress on an annual basis. 4. Mammography 2010. Patient knows she is way overdue. Patient agrees to schedule. SBE monthly reviewed. 5. DEXA 2-3 years ago at Dr. Yolande Skoda [place] office. 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 Increased calcium and vitamin D reviewed. 6. Colonoscopy 2010. Repeat at their recommended interval. 7. Health maintenance. No routine blood work done as she reports this done at her primary physician's office. Follow up 1 year, sooner as needed.     Dara LordsFONTAINE,Jolita Haefner P MD, 11:06 AM 05/10/2014

## 2014-05-11 LAB — URINALYSIS W MICROSCOPIC + REFLEX CULTURE
Bacteria, UA: NONE SEEN
Bilirubin Urine: NEGATIVE
CRYSTALS: NONE SEEN
Casts: NONE SEEN
Glucose, UA: NEGATIVE mg/dL
Hgb urine dipstick: NEGATIVE
Ketones, ur: NEGATIVE mg/dL
Nitrite: NEGATIVE
PROTEIN: NEGATIVE mg/dL
SPECIFIC GRAVITY, URINE: 1.024 (ref 1.005–1.030)
SQUAMOUS EPITHELIAL / LPF: NONE SEEN
Urobilinogen, UA: 0.2 mg/dL (ref 0.0–1.0)
pH: 5.5 (ref 5.0–8.0)

## 2014-05-11 LAB — CYTOLOGY - PAP

## 2014-05-12 LAB — URINE CULTURE

## 2014-06-01 ENCOUNTER — Other Ambulatory Visit: Payer: Self-pay | Admitting: Neurology

## 2014-06-01 NOTE — Telephone Encounter (Signed)
Rx sent 

## 2014-06-08 ENCOUNTER — Ambulatory Visit: Payer: Medicare PPO | Attending: Neurology | Admitting: Occupational Therapy

## 2014-06-08 ENCOUNTER — Ambulatory Visit: Payer: Medicare PPO | Admitting: Physical Therapy

## 2014-06-08 ENCOUNTER — Telehealth: Payer: Self-pay | Admitting: Neurology

## 2014-06-08 DIAGNOSIS — R279 Unspecified lack of coordination: Secondary | ICD-10-CM

## 2014-06-08 DIAGNOSIS — G20 Parkinson's disease: Secondary | ICD-10-CM

## 2014-06-08 DIAGNOSIS — R269 Unspecified abnormalities of gait and mobility: Secondary | ICD-10-CM

## 2014-06-08 DIAGNOSIS — R258 Other abnormal involuntary movements: Secondary | ICD-10-CM

## 2014-06-08 NOTE — Telephone Encounter (Signed)
Referral for PT/OT/ST sent to [facility] per therapist request.

## 2014-06-08 NOTE — Therapy (Signed)
[facility] [address] [place], [place], [address] Phone: [phone]   Fax:  [phone]  Patient Details  Name: Elizabeth Mcintosh MRN: [id number] Date of Birth: 11/29/1945  Encounter Date: 06/08/2014 Physical Therapy Parkinson's Disease Screen   Timed Up and Go test:10.06 sec  10 meter walk test:3.6 ft/sec  5 time sit to stand test:11/52 sec  Patient would benefit from Physical Therapy evaluation due to new onset reports of forward flexed posture and "lurching" with gait.  Pt also reports hoarseness of voice and husband needing her to repeat herself.  PT will also request speech therapy order, as pt was not scheduled for speech screen today.      Avrie Kedzierski W.  06/08/2014, 10:55 AM   Lonia BloodAmy Iseah Plouff, PT 06/08/2014 10:55 AM Phone: [phone] Fax: [phone]  [facility] [address] [place], [place], [address] Phone: [phone]   Fax:  [phone]

## 2014-06-08 NOTE — Therapy (Signed)
[facility] [address] [place], [place], [address] Phone: [phone]   Fax:  [phone]  Patient Details  Name: Elizabeth LickMary E Feagans MRN: [id number] Date of Birth: 08/18/1945  Encounter Date: 06/08/2014  Occupational Therapy Parkinson's Disease Screen  Physical Performance Test item #2 (simulated eating):  14.38sec  Physical Performance Test item #4 (donning/doffing jacket):  9.19sec  9-hole peg test:    RUE  32.41sec        LUE  27.97sec  Box & Blocks Test:   RUE  39 Blocks        LUE  45 blocks  Change in ability to perform ADLs/IADLs:  Pt reports difficulty getting in and out of bed and increased difficulty with buttons.    Other Comments:  Pt reports that she works with Systems analystpersonal trainer 1x/wk and goes [facility] [employer] [facility].  Decreased coordination/functional reaching noted with decline in box and blocks test score.  Pt would benefit from occupational therapy evaluation due to change in ADLs and coordination.  Pt would also benefit from updated/reinforcement of HEP.  Pt desires to return to occupational therapy to address these difficulties.   [facility] CenterFREEMAN,ANGELA, OTR/L 06/08/2014, 10:21 AM  [facility] [address] [place], [place], [address] Phone: [phone]   Fax:  [phone]

## 2014-06-22 ENCOUNTER — Other Ambulatory Visit: Payer: Self-pay | Admitting: Dermatology

## 2014-06-27 ENCOUNTER — Ambulatory Visit (INDEPENDENT_AMBULATORY_CARE_PROVIDER_SITE_OTHER): Payer: Medicare PPO | Admitting: Neurology

## 2014-06-27 ENCOUNTER — Encounter: Payer: Self-pay | Admitting: Neurology

## 2014-06-27 VITALS — BP 92/62 | HR 64 | Ht 68.5 in | Wt 162.0 lb

## 2014-06-27 DIAGNOSIS — G4700 Insomnia, unspecified: Secondary | ICD-10-CM

## 2014-06-27 DIAGNOSIS — G20 Parkinson's disease: Secondary | ICD-10-CM

## 2014-06-27 NOTE — Progress Notes (Signed)
Elizabeth Mcintosh was seen today in the movement disorders clinic for neurologic consultation at the request of [name],[name], MD.  The consultation is for the evaluation of abnormal gait and "awkwardness" and weakness on the R side.  Pt states that they were away in [place] and [place] in October, 2014 and she felt like she just didn't bounce back after that.  She just felt "slow" after.  In Jan, she went to ortho to get an injection in her shoulder b/c of an impingement syndrome but still felt slow after PT, although she did good in PT.  She tried to d/c all the meds that she thought that she thought would interfere and could cause slowness - benadryl and unisom.  She feels that her right arm doesn't swing.  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  11/22/13 update:  Pt returns today for f/u, accompanied by her daughter who supplements the history.  The patient was diagnosed with Parkinson's disease last visit.  We started levodopa.  The patient reports that she is moving easier.  Her friends noticed that she is more alert.  She did her physical therapies and is planning on starting a cardiovascular exercise program, but has not figured out how exactly to do that or what to do.  She and her daughter bring a long list of questions.  Her daughter asks me about recognition of depression.  The patient denies this, but her daughter states that she does not think that the patient would be able to recognize it, and her daughter thinks that there is some depression.  The patient is very active, especially in regards to travel.  She will be traveling to [place] in July and then will be traveling again overseas in October.  She asks me about refilling her Ambien for insomnia.  Her daughter asks me about drinking alcohol.  Last visit, the patient reported that she drank about 3 or 4 glasses of wine per night, each totaling 6-8 ounces.  No falls.  No hallucinations.  No lightheadedness.    02/22/14  update:  Pt accompanied by her son, who supplements the history.  Pt saw Dr. Conley Canal since last visit.  No evidence of dementia.  Believed had adjustment d/o related to dx of PD and pt agreed to go to counseling with Dr. Conley Canal.  Dr. Conley Canal also discussed with her tapering alcohol use, as I have also.  Pt states that she was told that she could still have 3 drinks per night.   She has increased her carbidopa/levodopa 25/100 to 2 in the AM, 1 in the afternoon, 1 in the evening.  Pt states that she has more energy after going up in the AM.  Her BP was really low today, and she states that it "is always low."  She is sometimes dizzy but it is not bad.  She is getting up in the middle of the night frequently to use the bathroom and cannot sleep.  She wonders if the klonopin contributes to that.  She is exercising more; went 5 days last week.    06/27/14 update:  The patient is following up today.  She is accompanied by her husband who supplements the history.  She is currently on carbidopa/levodopa 25/100, 2 tablets in the morning, one in the afternoon and one in the evening.  Pramipexole was started last visit and worked up to 0.5 g 3 times a day.  She stopped  this and ended up restarting it in November.  She has not noticed a huge difference with this medication.  She did call me in November with an episode of vision change where everything went bright.  It lasted for about 7-10 minutes and she felt somewhat dizzy.  She is not able to have an MRI because of prior aneurysm clips.  A CT of the brain was done and did not reveal any changes.  A carotid ultrasound demonstrated less than 50% stenosis bilaterally.  She was just evaluated for PT/OT/ST on 06/08/2014.  She has been doing a lot of travel - [place], [place], [place] and she did well with a lot of walking.  Her husband states that she did pretty well.  She is working out with a Systems analyst with at [employer].  No falls but feels that she is "lurching"  forward.  Mood is good.  Trouble sleeping.  States that Dr. Timothy Lasso increased her klonopin to 1.5 tablets at night.  Still drinking 2 glasses wine per night but that is down from previously.   Neuroimaging has  previously been performed.  It is available for my review today.  CT of the brain was done on 07/30/13 demonstrating encephalomalacia in the R frontal region and prior aneurysm clips.   Pt reports that the aneursym happened when she was pregnant and it ruptured.  PREVIOUS MEDICATIONS: none to date  ALLERGIES:   Allergies  Allergen Reactions  . Penicillins     *childhood allergy*    CURRENT MEDICATIONS:  Current Outpatient Prescriptions on File Prior to Visit  Medication Sig Dispense Refill  . bimatoprost (LUMIGAN) 0.01 % SOLN Place 1 drop into both eyes at bedtime.    . carbidopa-levodopa (SINEMET IR) 25-100 MG per tablet TAKE 2 TABLETS IN THE AM, 1 TABLET AT NOON, 1 IN THE EVENING 120 tablet 5  . clonazePAM (KLONOPIN) 0.5 MG tablet Take 1 tablet (0.5 mg total) by mouth at bedtime. 30 tablet 5  . glucosamine-chondroitin 500-400 MG tablet Take 1 tablet by mouth daily.    Marland Kitchen LYSINE PO Take by mouth.    . pramipexole (MIRAPEX) 0.5 MG tablet Take 1 tablet (0.5 mg total) by mouth 3 (three) times daily. 90 tablet 5  . timolol (TIMOPTIC) 0.5 % ophthalmic solution Place 1 drop into both eyes every morning.    . vitamin B-12 (CYANOCOBALAMIN) 1000 MCG tablet Take 1,000 mcg by mouth daily.     No current facility-administered medications on file prior to visit.    PAST MEDICAL HISTORY:   Past Medical History  Diagnosis Date  . Aneurysm     s/p clips  . Glaucoma   . Parkinson's disease     PAST SURGICAL HISTORY:   Past Surgical History  Procedure Laterality Date  . Craniotomy    . Aneurym clipping      SOCIAL HISTORY:   History   Social History  . Marital Status: Married    Spouse Name: N/A    Number of Children: N/A  . Years of Education: N/A   Occupational History  .  retired     Copy, Music therapist   Social History Main Topics  . Smoking status: Former Games developer  . Smokeless tobacco: Not on file     Comment: quit 40 years ago  . Alcohol Use: 0.0 oz/week    0 Not specified per week     Comment: 1-2 glasses wine/day (6-8 oz in each glass)  . Drug Use:  No  . Sexual Activity: Yes   Other Topics Concern  . Not on file   Social History Narrative    FAMILY HISTORY:   Family Status  Relation Status Death Age  . Mother Deceased 58    fall  . Father Deceased 28    CAD, renal failure  . Brother Deceased     CVA, MI after hip replacement  . Brother Alive     bladder CA  . Child Alive     healthy    ROS:  A complete 10 system review of systems was obtained and was unremarkable apart from what is mentioned above.  PHYSICAL EXAMINATION:    VITALS:   Filed Vitals:   06/27/14 0907  BP: 92/62  Pulse: 64  Height: 5' 8.5" (1.74 m)  Weight: 162 lb (73.483 kg)    GEN:  The patient appears stated age and is in NAD. HEENT:  Normocephalic, atraumatic.  The mucous membranes are moist. The superficial temporal arteries are without ropiness or tenderness. CV:  RRR Lungs:  CTAB Neck/HEME:  There are no carotid bruits bilaterally.  Neurological examination:  Orientation: The patient is alert and oriented x3. Fund of knowledge is appropriate.  Recent and remote memory are intact.  Attention and concentration are normal.    Able to name objects and repeat phrases. Cranial nerves: There is good facial symmetry.   Extraocular muscles are intact.  There are no square wave jerks.  The visual fields are full to confrontational testing. The speech is fluent and clear. Soft palate rises symmetrically and there is no tongue deviation. Hearing is intact to conversational tone. Sensation: Sensation is intact to light and pinprick throughout (facial, trunk, extremities). Vibration is intact at the bilateral big toe. There is no extinction with double simultaneous  stimulation. There is no sensory dermatomal level identified.  No astereognosis.  Graphesthesia is intact.   Motor: Strength is 5/5 in the bilateral upper and lower extremities.   Shoulder shrug is equal and symmetric.  There is no pronator drift.   Movement examination: Tone: There is mild increased tone in the RUE.  Tone in the left upper extremity is mildly increased as well.  Tone in the bilateral lower extremities is normal. Abnormal movements: There is no tremor, even with distraction techniques. Coordination:  There is decremation with RAM's on the right but it is markedly improved Gait and Station: The patient has no significant difficulty arising out of a deep-seated chair without the use of the hands. The patient's stride length is normal today.  The patient has a negative pull test.       ASSESSMENT/PLAN:  1.  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  - Continue carbidopa/levodopa 25/100 2 tablets in the morning, with one in the afternoon and one in the evening.  Much improved after mirapex  0.5 mg tid.  Risks, benefits, side effects and alternative therapies were discussed.  The opportunity to ask questions was given and they were answered to the best of my ability.  The patient expressed understanding and willingness to follow the outlined treatment protocols.  -was giving consideration to one of atypical states including CBGD but looks much better after addition of mirapex and did see sx's on contralateral side today.  -continue exercise  -I stressed again that I want her to decrease her alcohol intake from 2 glasses per night to no more than 2-3 glasses of wine per week. 2.  Adjustment d/o  -  doing much better 3.  Urinary frequency  -saw gyn. 4.  Sleep difficulty  -on klonopin.  Talked about role that EtOH can play in disrupting sleep.    -Add melatonin 3 mg at night. 4.  Return in about 4 months (around 10/26/2014).

## 2014-06-27 NOTE — Patient Instructions (Signed)
You can start Melatonin 3 mg at night.

## 2014-07-07 ENCOUNTER — Other Ambulatory Visit: Payer: Self-pay | Admitting: Neurology

## 2014-07-07 DIAGNOSIS — G20 Parkinson's disease: Secondary | ICD-10-CM

## 2014-08-01 ENCOUNTER — Ambulatory Visit: Payer: Medicare PPO | Attending: Neurology | Admitting: Physical Therapy

## 2014-08-01 ENCOUNTER — Ambulatory Visit: Payer: Medicare PPO

## 2014-08-01 ENCOUNTER — Ambulatory Visit: Payer: Medicare PPO | Admitting: Occupational Therapy

## 2014-08-01 DIAGNOSIS — R258 Other abnormal involuntary movements: Secondary | ICD-10-CM

## 2014-08-01 DIAGNOSIS — R29898 Other symptoms and signs involving the musculoskeletal system: Secondary | ICD-10-CM | POA: Insufficient documentation

## 2014-08-01 DIAGNOSIS — R490 Dysphonia: Secondary | ICD-10-CM | POA: Insufficient documentation

## 2014-08-01 DIAGNOSIS — G20 Parkinson's disease: Secondary | ICD-10-CM

## 2014-08-01 DIAGNOSIS — R269 Unspecified abnormalities of gait and mobility: Secondary | ICD-10-CM | POA: Diagnosis present

## 2014-08-01 DIAGNOSIS — R279 Unspecified lack of coordination: Secondary | ICD-10-CM

## 2014-08-01 DIAGNOSIS — R293 Abnormal posture: Secondary | ICD-10-CM | POA: Diagnosis present

## 2014-08-01 DIAGNOSIS — R2689 Other abnormalities of gait and mobility: Secondary | ICD-10-CM

## 2014-08-01 NOTE — Therapy (Signed)
[facility] [address] [place], [place], [address] Phone: [phone]   Fax:  [phone]  Occupational Therapy Evaluation  Patient Details  Name: Elizabeth Mcintosh MRN: [id number] Date of Birth: 1946/02/12 Referring Provider:  Gwen Pounds, MD  Encounter Date: 08/01/2014      OT End of Session - 08/01/14 1439    Visit Number 1  1/10 G   Number of Visits 16   Date for OT Re-Evaluation 09/29/14   Authorization Type [employer], Auth required, G-code needed   OT Start Time 1146   OT Stop Time 1230   OT Time Calculation (min) 44 min   Activity Tolerance Patient tolerated treatment well   Behavior During Therapy [facility] for tasks assessed/performed      Past Medical History  Diagnosis Date  . Aneurysm     s/p clips  . Glaucoma   . Parkinson's disease     Past Surgical History  Procedure Laterality Date  . Craniotomy    . Aneurym clipping      There were no vitals taken for this visit.  Visit Diagnosis:  Bradykinesia  Lack of coordination  Rigidity  Decreased functional mobility      Subjective Assessment - 08/01/14 1143    Symptoms Pt reports getting out of bed is harder, "croaky voice,"  difficulty hooking bra   Currently in Pain? No/denies          [facility] OT Assessment - 08/01/14 1150    Assessment   Diagnosis Parkinson's disease   Onset Date 06/08/14   Assessment PD diagnosed 4/15 with therapy screen in 12/15 indicating need for therapy.   Prior Therapy OT/PT mid 2015   Precautions   Precautions Fall   Balance Screen   Has the patient fallen in the past 6 months Yes   How many times? --  1   Has the patient had a decrease in activity level because of a fear of falling?  Yes   Is the patient reluctant to leave their home because of a fear of falling?  No   Home  Environment   Lives With Significant other;Son   Prior Function   Leisure Exercises at [employer] 3-5 times per week; has personal  trainer once per week  enjoys getting on floor with grandchildren with difficulty   ADL   Eating/Feeding Needs assist with cutting food  at times, spills off spoon/fork, difficulty opening items   Grooming Modified independent   Upper Body Bathing Modified independent   Lower Body Bathing Modified independent   Upper Body Dressing Increased time  difficulty fastening bra, difficulty pulling down shirt   Lower Body Dressing Increased time  sits for dressing   Toilet Tranfer Modified independent   Toileting - Clothing Manipulation Modified independent   Toileting -  Hygiene Modified Independent   Tub/Shower Transfer Modified independent   Transfers/Ambulation Related to ADL's difficulty getting up from chair, difficulty with bed mobility   IADL   Shopping --  Cl[place] Ridges has someone with her and did previously   Prior Level of Function Light Housekeeping shares with husband   Light Housekeeping --  no longer carries Barrister's clerk   Meal Prep Plans, prepares and serves adequate meals independently  difficulty carrying pots/pans   Training and development officer own vehicle   Medication Management Is responsible for taking medication in correct dosages at correct time  occasional difficulty with taking on time   Prior Level of Function Financial Management  husband has always performed   Mobility   Mobility Status Independent   Mobility Status Comments Pt reports increased difficulty and occasional assist to get up from chair, and difficulty/assist for getting out of bed.   Written Expression   Dominant Hand Right   Handwriting Severe micrographia;100% legible   Vision - History   Visual History Glaucome   Vision Assessment   Comment WFL per pt   Activity Tolerance   Activity Tolerance Comments Pt reports fatigue due to not sleeping well.   Cognition   Overall Cognitive Status Within Functional Limits for tasks assessed  pt denies changes   Observation/Other Assessments    Standing Functional Reach Test R-10inches, L-12inches   Physical Performance Test   Yes   Simulated Eating Time (seconds) 19.32   Donning Doffing Jacket Time (seconds) 12.53   Sensation   Additional Comments WFL per pt report   Coordination   9 Hole Peg Test Right;Left   Right 9 Hole Peg Test 37.56   Left 9 Hole Peg Test 30.16   Box and Blocks R-29 blocks, L-38blocks   Tremors none noted/reported   Tone   Assessment Location Right Upper Extremity;Left Upper Extremity   ROM / Strength   AROM / PROM / Strength AROM   AROM   Overall AROM  Within functional limits for tasks performed   Overall AROM Comments with min v.c. due to bradykinesia for elbow ext, finger abduction   Hand Function   Right Hand Grip (lbs) 43   Left Hand Grip (lbs) 49   RUE Tone   RUE Tone --  min-mod rigidity   LUE Tone   LUE Tone --  very minimal rigidity                         OT Short Term Goals - 08/01/14 1454    OT SHORT TERM GOAL #1   Title Pt will be independent with PD-specific HEP.--ck 08/30/14   Time 4   Period Weeks   Status New   OT SHORT TERM GOAL #2   Title Pt will improve coordination/functional reaching for ADLs as shown by improving score on box and blocks test by at least 5 blocks with RUE.--ck 08/30/14   Baseline R-29 blocks   Time 4   Period Weeks   Status New   OT SHORT TERM GOAL #3   Title Pt will report increased ease with cutting meat, opening containers, fastening bra.--ck 08/30/14   Time 4   Period Weeks   Status New   OT SHORT TERM GOAL #4   Title Pt will verbalize understanding of PD-related community resources and ways to prevent future complications--08/30/14   Time 4   Period Weeks   Status New           OT Long Term Goals - 08/01/14 1505    OT LONG TERM GOAL #1   Title Pt will verbalize understanding of AE/strategies to increase ease/independence with ADLs/IADLs prn.--ck 09/29/14   Time 8   Period Weeks   Status New   OT LONG TERM GOAL #2    Title Pt will improve coordination/functional reaching for ADLs as shown by improving score on box and blocks test by at least 10 blocks with RUE.--ck 09/29/14   Baseline R-29 blocks   Time 8   Period Weeks   Status New   OT LONG TERM GOAL #3   Title Pt will improve coordination/functional reaching for ADLs as shown  by improving score on box and blocks test by at least 5 blocks with LUE.--ck 09/29/14   Baseline L-38 blocks   Time 8   Period Weeks   Status New   OT LONG TERM GOAL #4   Title Pt will improve coordination for ADLs as shown by improving time on 9-hole peg test by at least 5 sec with RUE.--ck 09/29/14   Baseline R-37.56sec   Time 8   Period Weeks   Status New   OT LONG TERM GOAL #5   Title Pt will improve ability/ease with eating as shown by improving time on PPT#2 by at least 5sec.--ck 09/29/14   Baseline 19.32sec   Time 8   Period Weeks   Status New               Plan - 08-22-14 1441    Clinical Impression Statement Pt diagnosed with PD last year presents today with increased bradykinesia, increased rigidity, decreased coordination, decreased functional mobility affecting UE functional use/ADL and IADL performance from last episode of OT last year.   Pt will benefit from skilled therapeutic intervention in order to improve on the following deficits (Retired) Decreased balance;Impaired UE functional use;Impaired tone;Decreased strength;Decreased activity tolerance;Decreased endurance;Decreased mobility;Decreased coordination;Decreased knowledge of use of DME  rigidity, bradykinesia   Rehab Potential Good   OT Frequency 2x / week  +evaluation   OT Duration 8 weeks   OT Treatment/Interventions Self-care/ADL training;Therapeutic exercise;Neuromuscular education;Moist Heat;Fluidtherapy;Energy conservation;Building services engineer;Therapeutic exercises;Patient/family education;Balance training;Therapeutic activities;Passive range of motion;Manual Therapy;DME and/or  AE instruction;Cryotherapy;Ultrasound;Electrical Stimulation   Plan big amplitude movements   OT Home Exercise Plan HEP for big movements   Recommended Other Services is receiving PT and ST   Consulted and Agree with Plan of Care Patient          G-Codes - 2014/08/22 1521    Functional Assessment Tool Used Box and blocks test:  R-29, L-38 blocks; 9-hole peg test:  R-37.56, L-30.16sec, PPT#2 19.32sec   Functional Limitation Carrying, moving and handling objects   Carrying, Moving and Handling Objects Current Status (Z6109) At least 20 percent but less than 40 percent impaired, limited or restricted   Carrying, Moving and Handling Objects Goal Status (U0454) At least 1 percent but less than 20 percent impaired, limited or restricted      Problem List Patient Active Problem List   Diagnosis Date Noted  . Depression 11/22/2013  . 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    [facility] 2014/08/22, 3:27 PM  [facility] [address] [place], [place], [address] Phone: [phone]   Fax:  [phone]   Willa Frater, OTR/L 2014/08/22 3:27 PM

## 2014-08-01 NOTE — Therapy (Addendum)
[facility] [address] [place], [place], [address] Phone: [phone]   Fax:  [phone]  Speech Language Pathology Evaluation  Patient Details  Name: Elizabeth Mcintosh MRN: [id number] Date of Birth: 10/20/1945 Referring Provider:  Gwen Poundsusso, [name], MD  Encounter Date: 08/01/2014      End of Session - 08/01/14 1445    Visit Number 1   Number of Visits 16   Date for SLP Re-Evaluation 09/29/14   Authorization Type [employer] medic[employer] - given to LL 08-01-14   SLP Start Time 1106   SLP Stop Time  1147   SLP Time Calculation (min) 41 min   Activity Tolerance Patient tolerated treatment well      Past Medical History  Diagnosis Date  . Aneurysm     s/p clips  . Glaucoma   . Parkinson's disease     Past Surgical History  Procedure Laterality Date  . Craniotomy    . Aneurym clipping      There were no vitals taken for this visit.  Visit Diagnosis: Hypokinetic Parkinsonian dysphonia      Subjective Assessment - 08/01/14 1109    Symptoms "My husband says I'm getting softer, but I think he's going deaf." "I'm croaky, and I don't like it."          [facility] 08[facility]14 1111    SLP Visit Information   Onset Date Spring 2014-symptoms, diagnosed 2015   Medical Diagnosis Parkinson's Disease   Subjective   Subjective Pt complains that her husband thinks she is too soft. Reports no one else tells her she is too soft.   Pain Assessment   Currently in Pain? No/denies   General Information   HPI Pt with s/s Parkinson's Disease in 2014 and began treatment with orthopedic MD with different medical hypothesis. In 2015 was diagnosed with Parkinson's.    Prior Functional Status   Cognitive/Linguistic Baseline Within functional limits   Cognition   Overall Cognitive Status Within Functional Limits for tasks assessed   Verbal Expression   Overall Verbal Expression Appears within functional limits for tasks assessed    Oral Motor/Sensory Function   Labial ROM Within Functional Limits   Labial Strength Within Functional Limits   Labial Coordination Reduced   Lingual ROM Within Functional Limits   Lingual Strength Within Functional Limits   Lingual Coordination Reduced   Facial ROM --  masked facies   Velum Within Functional Limits   Overall Oral Motor/Sensory Function Pt and SLP discussed home tasks and how they were essesntial to therapy course. SLP shaped pt's loud /a/ for appropriate abdominal push and practiced sentences with pt to practice at home in order to improve vocal quality and volume at home. This took between 9-11 minutes.   Motor Speech   Overall Motor Speech Impaired   Respiration Impaired   Level of Impairment Sentence   Phonation Low vocal intensity   Resonance Within functional limits   Articulation Impaired   Level of Impairment Sentence   Intelligibility Intelligibility reduced   Sentence 75-100% accurate   Conversation 75-100% accurate   Motor Planning Witnin functional limits   Effective Techniques Increased vocal intensity   Phonation Impaired  vocal fry due to reduced breath support   Volume Soft    After oral motor assessment average volume in conversation was assessed at 64dB with sound level meter 30cm away from sound source and vocal fry 90% of the time. Loud /a/ measured at 82dB average  with min-mod cues usually to remain loud.  Diagnostic therapy ensued following these eval tasks and pt asked to produce speech with the same effort level and abdominal push as with loud /a/. Pt's conversation measured at 67dB with occasional min A to remain loud. Vocal fry reduced to approx. 35%. Pt is good candidate for therapy based upon these measures.         SLP Education - 08-24-14 1444    Education provided Yes   Education Details loud /a/, need to complete loud /a/ daily, therapy course/goals   Person(s) Educated Patient   Methods Explanation;Demonstration    Comprehension Verbalized understanding;Returned demonstration;Verbal cues required          SLP Short Term Goals - August 24, 2014 1447    SLP SHORT TERM GOAL #1   Title pt will demo 18/20 sentence responses at 69dB over 2 sessions   Time 4   Period Weeks   Status New   SLP SHORT TERM GOAL #2   Title pt will engage in 5 minutes simple conversation with average 70dB over two sessions   Time 4   Period Weeks   Status New   SLP SHORT TERM GOAL #3   Title pt will maintain average loudness of 83dB   Time 4   Period Weeks   Status New          SLP Long Term Goals - 08/24/14 1634    SLP LONG TERM GOAL #1   Title pt will demo 8 minutes mod complex conversation with at least average 69dB over 2 sessions   Time 8   Period Weeks   Status New   SLP LONG TERM GOAL #2   Title pt will report she feels more confident about her voice quality (less "croaky voice") when talking outside of clinic    Time 8   Period Weeks   Status New   SLP LONG TERM GOAL #3   Title pt will maintain loud /a/ at average 83 dB over 5 sessions   Time 8   Period Weeks   Status New          Plan - 08-24-2014 1445    Clinical Impression Statement Pt presents with hypophonic voice due to Parkinson's Disease. Rec skilled ST to improve pt's loudness across conversational partners and situations and improve vocal quality and thus QOL (pt remarked her "croaky voice" is not pleasant to her)   Speech Therapy Frequency 2x / week   Duration --  8 weeks   Treatment/Interventions Compensatory techniques;Internal/external aids;SLP instruction and feedback;Functional tasks;Patient/family education   Potential to Achieve Goals Good          G-Codes - Aug 24, 2014 1636    Functional Assessment Tool Used noms   Functional Limitations Motor speech   Motor Speech Current Status [phone]) At least 20 percent but less than 40 percent impaired, limited or restricted   Motor Speech Goal Status (U0454) At least 1 percent but less  than 20 percent impaired, limited or restricted      Problem List Patient Active Problem List   Diagnosis Date Noted  . Depression 11/22/2013  . 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 [place], [place], [address] Phone: [phone]   Fax:  [phone]

## 2014-08-01 NOTE — Patient Instructions (Signed)
Complete 5 loud /a/ repetitions, twice a day  Read 10 sentences with your re-calibrated louder voice, or read 2-3 sentence news stories LOUDLY

## 2014-08-02 NOTE — Therapy (Signed)
[facility] [address] [place], [place], [address] Phone: [phone]   Fax:  [phone]  Physical Therapy Evaluation  Patient Details  Name: Elizabeth Mcintosh MRN: [id number] Date of Birth: February 13, 1946 Referring Provider:  Gwen Pounds, MD  Encounter Date: 08/01/2014      PT End of Session - 08/02/14 1305    Visit Number 1   Number of Visits 9   Date for PT Re-Evaluation 09/30/14   Authorization Type [employer] G-code 10th visit   PT Start Time 1020   PT Stop Time 1100   PT Time Calculation (min) 40 min      Past Medical History  Diagnosis Date  . Aneurysm     s/p clips  . Glaucoma   . Parkinson's disease     Past Surgical History  Procedure Laterality Date  . Craniotomy    . Aneurym clipping      There were no vitals taken for this visit.  Visit Diagnosis:  Abnormal posture  Bradykinesia  Rigidity  Abnormality of gait      Subjective Assessment - 08/01/14 1025    Symptoms Pt is a 69 year old female with Parkinson's disease who reports slight slowing of functional mobility and "lurching" more with gait.  She feels more insecure with bending to reach  objects on floor.  She reports difficulty with getting in and out of the bed.  She had one fall in the ice, which occurred on the steps in January.   Pertinent History akinetic rigid Parkinson's disease   Patient Stated Goals Pt's goal for therapy is to improve flexibility on R side and to improve speech.   Currently in Pain? No/denies          [facility] PT Assessment - 08/02/14 1327    Assessment   Medical Diagnosis Parkinson's disease   Onset Date --  April 2015   Precautions   Precautions Fall   Home Environment   Living Enviornment Private residence   Living Arrangements Spouse/significant other   Available Help at Discharge Family   Type of Home House   Home Access Stairs to enter   Entrance Stairs-Number of Steps 15   Entrance  Stairs-Rails Can reach both  for 6 steps, then one rail on left.   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   Transfers   Transfers Sit to Stand;Stand to Sit   Sit to Stand Without upper extremity assist;From chair/3-in-1;Five times sit to stand  5x sit<>stand 19.94 with 2 episodes of posterior lean   Ambulation/Gait   Ambulation/Gait Yes   Ambulation Distance (Feet) 150 Feet   Assistive device None   Gait Pattern Decreased step length - left;Decreased dorsiflexion - right  occasional R foot drag; stiffnes/no arm swing R UE   Gait velocity 9.84 sec=3.33 ft/sec  Gait Comments decreased trunk rotation, decreased R arm swing, occasional R foot catching on floor due to decr. foot clearance   Standardized Balance Assessment   Standardized Balance Assessment Timed Up and Go Test   Timed Up and Go Test   TUG Normal TUG;Manual TUG;Cognitive TUG   Normal TUG (seconds) 11   Manual TUG (seconds) 11.76   Cognitive TUG (seconds) 12.19   TUG Comments pt does not fully turn to sit   Functional Gait  Assessment   Gait assessed  Yes   Gait Level Surface Walks 20 ft in less than 7 sec but greater than 5.5 sec, uses assistive device, slower speed, mild gait deviations, or deviates 6-10 in  outside of the 12 in walkway width.  6.36 sec   Change in Gait Speed Able to change speed, demonstrates mild gait deviations, deviates 6-10 in outside of the 12 in walkway width, or no gait deviations, unable to achieve a major change in velocity, or uses a change in velocity, or uses an assistive device.   Gait with Horizontal Head Turns Performs head turns smoothly with slight change in gait velocity (eg, minor disruption to smooth gait path), deviates 6-10 in outside 12 in walkway width, or uses an assistive device.   Gait with Vertical Head Turns Performs task with slight change in gait velocity (eg, minor disruption to smooth gait path), deviates 6 - 10 in outside 12 in walkway width or uses assistive device   Gait and Pivot Turn Pivot turns safely in greater than 3 sec and stops with no loss of balance, or pivot turns safely within 3 sec and stops with mild imbalance, requires small steps to catch balance.  2.54 sec, narrow BOS and decr. foot clearance   Step Over Obstacle Is able to step over one shoe box (4.5 in total height) but must slow down and adjust steps to clear box safely. May require verbal cueing.   Gait with Narrow Base of Support Ambulates less than 4 steps heel to toe or cannot perform without assistance.   Gait with Eyes Closed Walks 20 ft, slow speed, abnormal gait pattern, evidence for imbalance, deviates 10-15 in outside 12 in walkway width. Requires more than 9 sec to ambulate 20 ft.  >10 seconds   Ambulating Backwards Walks 20 ft, uses assistive device, slower speed, mild gait deviations, deviates 6-10 in outside 12 in walkway width.  6.34 sec in 10 ft   Steps Alternating feet, must use rail.  Reports occasional R foot catching on steps at home   Total Score 16           [facility] - 08/02/14 1327    Assessment   Medical Diagnosis Parkinson's disease   Onset Date --  April 2015   Precautions   Precautions Fall   Home Environment   Living Enviornment  Private residence   Living Arrangements Spouse/significant other   Available Help at Discharge Family   Type of Home House   Home Access Stairs to enter   Entrance Stairs-Number of Steps 15   Entrance Stairs-Rails Can reach both  for 6 steps, then one rail on left.   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   Transfers   Transfers Sit to Stand;Stand to Sit   Sit to Stand Without upper extremity assist;From chair/3-in-1;Five times sit to stand  5x sit<>stand 19.94 with 2 episodes of posterior lean   Ambulation/Gait   Ambulation/Gait Yes   Ambulation Distance (Feet) 150 Feet   Assistive device None   Gait Pattern Decreased step length - left;Decreased dorsiflexion - right  occasional R foot drag; stiffnes/no arm swing R UE   Gait velocity 9.84 sec=3.33 ft/sec   Gait Comments decreased trunk rotation, decreased R arm swing, occasional R foot catching on floor due to decr. foot clearance   Standardized Balance Assessment   Standardized Balance Assessment Timed Up and Go Test   Timed Up and Go Test   TUG Normal TUG;Manual TUG;Cognitive TUG   Normal TUG (seconds) 11   Manual TUG (seconds) 11.76   Cognitive TUG (seconds) 12.19   TUG Comments pt does not fully turn to sit    Functional Gait  Assessment   Gait assessed  Yes   Gait Level Surface Walks 20 ft in less than 7 sec but greater than 5.5 sec, uses assistive device, slower speed, mild gait deviations, or deviates 6-10 in outside of the 12 in walkway width.  6.36 sec   Change in Gait Speed Able to change speed, demonstrates mild gait deviations, deviates 6-10 in outside of the 12 in walkway width, or no gait deviations, unable to achieve a major change in velocity, or uses a change in velocity, or uses an assistive device.   Gait with Horizontal Head Turns Performs head turns smoothly with slight change in gait velocity (eg, minor disruption to smooth gait path), deviates 6-10 in outside 12 in walkway width, or uses an assistive device.   Gait with Vertical Head Turns Performs task with slight change in gait velocity (eg, minor disruption to smooth gait path), deviates 6 - 10 in outside 12 in walkway width or uses assistive device   Gait and Pivot Turn Pivot turns safely in greater than 3 sec and stops with no loss of balance, or pivot turns safely within 3 sec and stops with mild imbalance, requires small steps to catch balance.  2.54 sec, narrow BOS and decr. foot clearance   Step Over Obstacle Is able to step over one shoe box (4.5 in total height) but must slow down and adjust steps to clear box safely. May require verbal cueing.   Gait with Narrow Base of Support Ambulates less than 4 steps heel to toe or cannot perform without assistance.   Gait with Eyes Closed Walks 20 ft, slow speed, abnormal gait pattern, evidence for imbalance, deviates 10-15 in outside 12 in walkway width. Requires more than 9 sec to ambulate 20 ft.  >10 seconds   Ambulating Backwards Walks 20 ft, uses assistive device, slower speed, mild gait deviations, deviates 6-10 in outside 12 in walkway width.  6.34 sec in 10 ft   Steps Alternating feet, must use rail.  Reports occasional R foot catching on steps at home   Total Score 16  PT Education - 08/02/14 1303    Education provided Yes   Education Details provided information on Parkinson's education seminar; Parkinson's cycling class as [employer]   Person(s) Educated Patient   Methods Explanation;Handout   Comprehension Verbalized understanding             PT Long Term Goals - 08/02/14 1316    PT LONG TERM GOAL #1   Title Pt will be independent with HEP for improved balance, transfers, and gait.   Time 4   Period Weeks   Status New   PT LONG TERM GOAL #2   Title Pt will improve 5x sit<>stand transfers to less than or equal to 15 seconds for improved transfer safety and efficiency.   Time 4   Period Weeks   Status New   PT LONG TERM GOAL #3   Title Pt will improve Functional Gait Assessment to at least 20/30 for decreased fall risk.   Time 4   Period Weeks   PT LONG TERM GOAL #4   Title Pt will verbalize at least 25% improvement in bed mobility and squats for improved functional mobility.   Time 4   Period Weeks   Status New   PT LONG TERM GOAL #5   Title Pt will verbalize understanding of fall prevention within the home environment.   Time 4   Period Weeks   Status New               Plan - 08/02/14 1311    Clinical Impression Statement Pt is a 69 year old female who presents to PT evaluation with reports of slowed functional mobility and changes in balance and walking over the past several months.  She reports increased difficulty squatting to pick up objects, bed mobility and with sit to stand from low surfaces.  Pt is at fall risk per Functional Gait Assessment score of 16/30 and has slowed transfers on 5x sit<>stand transfers with increased posterior lean.  Pt would benefit from skilled PT to address balance and gait and functional activities.   Pt will benefit from skilled therapeutic intervention in order to improve on the following deficits Abnormal gait;Decreased balance;Decreased  strength;Difficulty walking;Decreased mobility;Postural dysfunction   Rehab Potential Good   PT Frequency 2x / week   PT Duration 4 weeks  plus evaluation   PT Treatment/Interventions ADLs/Self Care Home Management;Therapeutic activities;Functional mobility training;Stair training;Gait training;Therapeutic exercise;Balance training;Neuromuscular re-education;Patient/family education   PT Next Visit Plan Functional activities:  sit<>stand low surfaces, squatting to pick up objects, bed mobility; treadmill gait training; initiate PWR! Moves standing and supine   PT Home Exercise Plan functional activities as above; PWR! moves   Consulted and Agree with Plan of Care Patient          G-Codes - 08-22-2014 1321    Functional Assessment Tool Used 5x sit<>stand 19.94 seconds; Functional Gait Assessment:  16/30   Functional Limitation Mobility: Walking and moving around   Mobility: Walking and Moving Around Current Status [phone]) At least 40 percent but less than 60 percent impaired, limited or restricted   Mobility: Walking and Moving Around Goal Status [phone]) At least 20 percent but less than 40 percent impaired, limited or restricted       Problem List Patient Active Problem List   Diagnosis Date Noted  . Depression 11/22/2013  . 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 08/02/2014, 1:27 PM Ronit Marczak, PT 08/02/2014 1:28 PM Phone: [phone] Fax: [phone]  [facility]  [place] [address] [place], [place], [address] Phone: [phone]   Fax:  [phone]

## 2014-08-21 ENCOUNTER — Ambulatory Visit: Payer: Medicare PPO | Admitting: Physical Therapy

## 2014-08-21 ENCOUNTER — Ambulatory Visit: Payer: Medicare PPO | Attending: Neurology | Admitting: Occupational Therapy

## 2014-08-21 ENCOUNTER — Encounter: Payer: Self-pay | Admitting: Occupational Therapy

## 2014-08-21 ENCOUNTER — Ambulatory Visit: Payer: Medicare PPO

## 2014-08-21 DIAGNOSIS — R279 Unspecified lack of coordination: Secondary | ICD-10-CM | POA: Diagnosis present

## 2014-08-21 DIAGNOSIS — R490 Dysphonia: Secondary | ICD-10-CM | POA: Insufficient documentation

## 2014-08-21 DIAGNOSIS — R293 Abnormal posture: Secondary | ICD-10-CM | POA: Diagnosis present

## 2014-08-21 DIAGNOSIS — R2689 Other abnormalities of gait and mobility: Secondary | ICD-10-CM | POA: Insufficient documentation

## 2014-08-21 DIAGNOSIS — R269 Unspecified abnormalities of gait and mobility: Secondary | ICD-10-CM | POA: Diagnosis present

## 2014-08-21 DIAGNOSIS — R29898 Other symptoms and signs involving the musculoskeletal system: Secondary | ICD-10-CM | POA: Insufficient documentation

## 2014-08-21 DIAGNOSIS — R258 Other abnormal involuntary movements: Secondary | ICD-10-CM | POA: Diagnosis present

## 2014-08-21 NOTE — Therapy (Signed)
[facility] [address] [place], [place], [address] Phone: [phone]   Fax:  [phone]  Occupational Therapy Treatment  Patient Details  Name: Elizabeth Mcintosh MRN: [id number] Date of Birth: 07/20/1945 Referring Provider:  Creola Cornusso, John, MD  Encounter Date: 08/21/2014      OT End of Session - 08/21/14 1033    Visit Number 1  1/10 G   Number of Visits 16   Date for OT Re-Evaluation 09/29/14   Authorization Type [employer] PPO, Auth required, G-code needed   Authorization Time Period 6 visits approved from 2/23-09/15/14   Authorization - Visit Number 1   Authorization - Number of Visits 6   OT Start Time 1017   OT Stop Time 1100   OT Time Calculation (min) 43 min   Activity Tolerance Patient tolerated treatment well   Behavior During Therapy [facility] for tasks assessed/performed      Past Medical History  Diagnosis Date  . Aneurysm     s/p clips  . Glaucoma   . Parkinson's disease     Past Surgical History  Procedure Laterality Date  . Craniotomy    . Aneurym clipping      There were no vitals filed for this visit.  Visit Diagnosis:  Bradykinesia  Rigidity  Lack of coordination  Abnormal posture  Decreased functional mobility      Subjective Assessment - 08/21/14 1658    Symptoms The PD education Friday was really good.     Currently in Pain? No/denies                    OT Treatments/Exercises (OP) - 08/21/14 0001    Neurological Re-education Exercises   Other Exercises 1 Flipping cards with each hand with focus on big movements (supination, finger extension) with min-mod cues.                OT Education - 08/21/14 1659    Education provided Yes   Education Details PWR! moves (basic 4) in prone and quadraped (step without PWR! up in quadraped) and PWR! hands HEP, emphasized importance of exercise as medicine and ways to prevent future complications   Person(s) Educated  Patient   Methods Explanation;Demonstration;Verbal cues;Handout   Comprehension Verbalized understanding;Returned demonstration          OT Short Term Goals - 08/01/14 1454    OT SHORT TERM GOAL #1   Title Pt will be independent with PD-specific HEP.--ck 08/30/14   Time 4   Period Weeks   Status New   OT SHORT TERM GOAL #2   Title Pt will improve coordination/functional reaching for ADLs as shown by improving score on box and blocks test by at least 5 blocks with RUE.--ck 08/30/14   Baseline R-29 blocks   Time 4   Period Weeks   Status New   OT SHORT TERM GOAL #3   Title Pt will report increased ease with cutting meat, opening containers, fastening bra.--ck 08/30/14   Time 4   Period Weeks   Status New   OT SHORT TERM GOAL #4   Title Pt will verbalize understanding of PD-related community resources and ways to prevent future complications--08/30/14   Time 4   Period Weeks   Status New           OT Long Term Goals - 08/01/14 1505    OT LONG TERM GOAL #1   Title Pt will verbalize understanding of AE/strategies to increase ease/independence with  ADLs/IADLs prn.--ck 09/29/14   Time 8   Period Weeks   Status New   OT LONG TERM GOAL #2   Title Pt will improve coordination/functional reaching for ADLs as shown by improving score on box and blocks test by at least 10 blocks with RUE.--ck 09/29/14   Baseline R-29 blocks   Time 8   Period Weeks   Status New   OT LONG TERM GOAL #3   Title Pt will improve coordination/functional reaching for ADLs as shown by improving score on box and blocks test by at least 5 blocks with LUE.--ck 09/29/14   Baseline L-38 blocks   Time 8   Period Weeks   Status New   OT LONG TERM GOAL #4   Title Pt will improve coordination for ADLs as shown by improving time on 9-hole peg test by at least 5 sec with RUE.--ck 09/29/14   Baseline R-37.56sec   Time 8   Period Weeks   Status New   OT LONG TERM GOAL #5   Title Pt will improve ability/ease with  eating as shown by improving time on PPT#2 by at least 5sec.--ck 09/29/14   Baseline 19.32sec   Time 8   Period Weeks   Status New               Plan - 08/21/14 1703    Clinical Impression Statement Pt needs cueing for increasing movement amplitude, particular with RUE.   Plan review PWR! moves HEP, coordination   Consulted and Agree with Plan of Care Patient        Problem List Patient Active Problem List   Diagnosis Date Noted  . Depression 11/22/2013  . 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

## 2014-08-21 NOTE — Patient Instructions (Signed)
  Please complete the assigned speech therapy homework before your next session. Work for 15 minutes, twice a day.  Continue with loud /a/, 5 repetitions twice a day.

## 2014-08-21 NOTE — Therapy (Signed)
[facility] [address] [place], [place], [address] Phone: [phone]   Fax:  [phone]  Speech Language Pathology Treatment  Patient Details  Name: Elizabeth LickMary E Mcintosh MRN: [id number] Date of Birth: 02/12/1946 Referring Provider:  Creola Cornusso, John, MD  Encounter Date: 08/21/2014      End of Session - 08/21/14 1150    Visit Number 2   Number of Visits 16   Date for SLP Re-Evaluation 09/29/14   Authorization Type humana   Authorization Time Period 2-23 to 09-30-14   Authorization - Visit Number 1   Authorization - Number of Visits 8   SLP Start Time 1104   SLP Stop Time  1144   SLP Time Calculation (min) 40 min   Activity Tolerance Patient tolerated treatment well      Past Medical History  Diagnosis Date  . Aneurysm     s/p clips  . Glaucoma   . Parkinson's disease     Past Surgical History  Procedure Laterality Date  . Craniotomy    . Aneurym clipping      There were no vitals filed for this visit.  Visit Diagnosis: Hypokinetic Parkinsonian dysphonia      Subjective Assessment - 08/21/14 1128    Symptoms Pt misses noon dose of PD med "maybe a couple times a week". "My husband hasn't told me to speak up as much as he always does."               ADULT SLP TREATMENT - 08/21/14 1114    General Information   Behavior/Cognition Alert;Pleasant mood;Cooperative   Treatment Provided   Treatment provided Cognitive-Linquistic   Pain Assessment   Pain Assessment No/denies pain   Cognitive-Linquistic Treatment   Treatment focused on Dysarthria   Skilled Treatment Pt with average loud /a/ at 84dB with initial cues to open mouth for /a/. Sequences (simple to mod complex) with usual cues for loudness - average 70dB. Similarities and differences with 70dB. Verbal and nonverbal occasional min-mod cues for loudness were helpful in pt attaining 70dB. No carryover into between-task conversation/comments. SLP told pt  of this and pt still without carryover between structured tasks.    Assessment / Recommendations / Plan   Plan Continue with current plan of care   Progression Toward Goals   Progression toward goals Progressing toward goals          SLP Education - 08/21/14 1149    Education provided Yes   Education Details reminder why pt performing loud /a/   Person(s) Educated Patient   Methods Explanation   Comprehension Verbalized understanding          SLP Short Term Goals - 08/21/14 1152    SLP SHORT TERM GOAL #1   Title pt will demo 18/20 sentence responses at 69dB over 2 sessions   Time 4   Period Weeks   Status On-going   SLP SHORT TERM GOAL #2   Title pt will engage in 5 minutes simple conversation with average 70dB over two sessions   Time 4   Period Weeks   Status On-going   SLP SHORT TERM GOAL #3   Title pt will maintain average loudness of 83dB   Time 4   Period Weeks   Status On-going          SLP Long Term Goals - 08/21/14 1152    SLP LONG TERM GOAL #1   Title pt will demo 8 minutes mod complex conversation with at  least average 69dB over 2 sessions   Time 8   Period Weeks   Status On-going   SLP LONG TERM GOAL #2   Title pt will report she feels more confident about her voice quality (less "croaky voice") when talking outside of clinic    Time 8   Period Weeks   Status On-going   SLP LONG TERM GOAL #3   Title pt will maintain loud /a/ at average 83 dB over 5 sessions   Time 8   Period Weeks   Status On-going          Plan - 08/21/14 1150    Clinical Impression Statement Pt is beginning to improve awareness of softer voice. Requires cont skilled ST to incr WNL loudness in conversation. Minimal carryover to unstructured tasks/conversation.   Speech Therapy Frequency 2x / week   Duration --  8 weeks   Treatment/Interventions Compensatory techniques;Internal/external aids;SLP instruction and feedback;Functional tasks;Patient/family education    Potential to Achieve Goals Good   Consulted and Agree with Plan of Care Patient        Problem List Patient Active Problem List   Diagnosis Date Noted  . Depression 11/22/2013  . 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

## 2014-08-21 NOTE — Patient Instructions (Signed)
.  PWR! Hands  With arms stretched out in front of you (elbows straight), perform the following:  PWR! Up: Close hands and flick fingers open and apart BIG  PWR! Rock: Move wrists up and down BIG  PWR! Twist: Twist palms up and down BIG  Then, start with elbows bent and hands closed.  PWR! Step: Touch index finger to thumb while keeping other fingers straight. Flick fingers out BIG (thumb out/straighten fingers). Repeat with other fingers. (Step your thumb to each finger).  PWR! Hands: Push hands out BIG. Elbows straight, wrists up, fingers open and spread apart BIG. (Can also perform by pushing down on table, chair, knees. Push above head, out to the side, behind you, in front of you.)   ** Make each movement big and deliberate so that you feel the movement.  Perform at least 10 repetitions 1x/day, but perform PWR! hands throughout the day when you are having trouble using your hands (picking up/manipulating small objects, writing, eating, typing, sewing, buttoning, etc.). 

## 2014-08-23 ENCOUNTER — Ambulatory Visit: Payer: Medicare PPO | Admitting: Occupational Therapy

## 2014-08-23 ENCOUNTER — Encounter: Payer: Self-pay | Admitting: Physical Therapy

## 2014-08-23 ENCOUNTER — Ambulatory Visit: Payer: Medicare PPO

## 2014-08-23 ENCOUNTER — Ambulatory Visit: Payer: Medicare PPO | Admitting: Physical Therapy

## 2014-08-23 DIAGNOSIS — R279 Unspecified lack of coordination: Secondary | ICD-10-CM

## 2014-08-23 DIAGNOSIS — G20 Parkinson's disease: Secondary | ICD-10-CM

## 2014-08-23 DIAGNOSIS — R293 Abnormal posture: Secondary | ICD-10-CM | POA: Diagnosis not present

## 2014-08-23 DIAGNOSIS — R258 Other abnormal involuntary movements: Secondary | ICD-10-CM

## 2014-08-23 DIAGNOSIS — R490 Dysphonia: Secondary | ICD-10-CM

## 2014-08-23 DIAGNOSIS — R29898 Other symptoms and signs involving the musculoskeletal system: Secondary | ICD-10-CM

## 2014-08-23 NOTE — Therapy (Signed)
[facility] [address] [place], [place], [address] Phone: [phone]   Fax:  [phone]  Occupational Therapy Treatment  Patient Details  Name: Elizabeth Mcintosh MRN: [id number] Date of Birth: 03/27/1946 Referring Provider:  Creola Cornusso, John, MD  Encounter Date: 08/23/2014      OT End of Session - 08/23/14 1106    Visit Number 2   Number of Visits 16   Date for OT Re-Evaluation 09/29/14   Authorization Type [employer] PPO, Auth required, G-code needed   Authorization Time Period 6 visits approved from 2/23-09/15/14   Authorization - Visit Number 2   Authorization - Number of Visits 6   OT Start Time 1103   OT Stop Time 1145   OT Time Calculation (min) 42 min   Activity Tolerance Patient tolerated treatment well   Behavior During Therapy [facility] for tasks assessed/performed      Past Medical History  Diagnosis Date  . Aneurysm     s/p clips  . Glaucoma   . Parkinson's disease     Past Surgical History  Procedure Laterality Date  . Craniotomy    . Aneurym clipping      There were no vitals filed for this visit.  Visit Diagnosis:  Bradykinesia  Rigidity  Lack of coordination      Subjective Assessment - 08/23/14 1104    Currently in Pain? No/denies       Treatment: PWR! hands followed by flipping playing cards and dealing with thumb , min v.c. For performance. Copying small peg design with RUE, for increased fine motor coordination, min v.c.                [facility] - 08/23/14 1112    PWR! exercises Moves in prone   PWR! Up 20   PWR! Rock 10   PWR! Twist 20   PWR! Step 10   Comments Pt was fatigued and lost balance when scooting off mat at end of exercises  Therapist assisted pt with regaining balance.   PWR! Up 20   PWR! Rock 20   PWR! Twist 20   PWR! Step 10               OT Short Term Goals - 08/01/14 1454    OT SHORT TERM GOAL #1   Title Pt will be independent with  PD-specific HEP.--ck 08/30/14   Time 4   Period Weeks   Status New   OT SHORT TERM GOAL #2   Title Pt will improve coordination/functional reaching for ADLs as shown by improving score on box and blocks test by at least 5 blocks with RUE.--ck 08/30/14   Baseline R-29 blocks   Time 4   Period Weeks   Status New   OT SHORT TERM GOAL #3   Title Pt will report increased ease with cutting meat, opening containers, fastening bra.--ck 08/30/14   Time 4   Period Weeks   Status New   OT SHORT TERM GOAL #4   Title Pt will verbalize understanding of PD-related community resources and ways to prevent future complications--08/30/14   Time 4   Period Weeks   Status New           OT Long Term Goals - 08/01/14 1505    OT LONG TERM GOAL #1   Title Pt will verbalize understanding of AE/strategies to increase ease/independence with ADLs/IADLs prn.--ck 09/29/14   Time 8   Period Weeks   Status New  OT LONG TERM GOAL #2   Title Pt will improve coordination/functional reaching for ADLs as shown by improving score on box and blocks test by at least 10 blocks with RUE.--ck 09/29/14   Baseline R-29 blocks   Time 8   Period Weeks   Status New   OT LONG TERM GOAL #3   Title Pt will improve coordination/functional reaching for ADLs as shown by improving score on box and blocks test by at least 5 blocks with LUE.--ck 09/29/14   Baseline L-38 blocks   Time 8   Period Weeks   Status New   OT LONG TERM GOAL #4   Title Pt will improve coordination for ADLs as shown by improving time on 9-hole peg test by at least 5 sec with RUE.--ck 09/29/14   Baseline R-37.56sec   Time 8   Period Weeks   Status New   OT LONG TERM GOAL #5   Title Pt will improve ability/ease with eating as shown by improving time on PPT#2 by at least 5sec.--ck 09/29/14   Baseline 19.32sec   Time 8   Period Weeks   Status New               Problem List Patient Active Problem List   Diagnosis Date Noted  . Depression  11/22/2013  . 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 [place], [place], [address] Phone: [phone]   Fax:  [phone]

## 2014-08-23 NOTE — Therapy (Signed)
[facility] [address] [place], [place], [address] Phone: [phone]   Fax:  [phone]  Speech Language Pathology Treatment  Patient Details  Name: Elizabeth Mcintosh MRN: [id number] Date of Birth: 02/05/1946 Referring Provider:  Creola Cornusso, John, MD  Encounter Date: 08/23/2014      End of Session - 08/23/14 1339    Visit Number 3   Number of Visits 16   Date for SLP Re-Evaluation 09/29/14   Authorization Time Period 2-23 to 09-30-14   Authorization - Visit Number 2   Authorization - Number of Visits 8   SLP Start Time 1147   SLP Stop Time  1229   SLP Time Calculation (min) 42 min   Activity Tolerance Patient tolerated treatment well      Past Medical History  Diagnosis Date  . Aneurysm     s/p clips  . Glaucoma   . Parkinson's disease     Past Surgical History  Procedure Laterality Date  . Craniotomy    . Aneurym clipping      There were no vitals filed for this visit.  Visit Diagnosis: Hypokinetic Parkinsonian dysphonia      Subjective Assessment - 08/23/14 1150    Symptoms "We did a lot in PT today!" "I did some "ah"s yesterday."               ADULT SLP TREATMENT - 08/23/14 1151    General Information   Behavior/Cognition Alert;Cooperative;Pleasant mood   Treatment Provided   Treatment provided Cognitive-Linquistic   Pain Assessment   Pain Assessment No/denies pain   Cognitive-Linquistic Treatment   Treatment focused on Dysarthria   Skilled Treatment Pt with average loud /a/ at 84dB with initial cues for loudness. SLP facilitated sentence responses from pt. She requ'd usual min-mod cues for loudness and incr'd effort, faded to occasional min-mod A. Pt was encouraged to think about and feel incr'd abdominal push (more effort), louder voice, and voice quality WNL to verify she is speaking at a WNL volume.     Assessment / Recommendations / Plan   Plan Continue with current plan of care   Progression Toward Goals   Progression toward goals Progressing toward goals          SLP Education - 08/23/14 1338    Education provided Yes   Education Details abdominal push/incr'd effort for speech, louder volume, and WNL vocal quality as signs of using WNL volume   Person(s) Educated Patient   Methods Explanation;Demonstration;Verbal cues   Comprehension Verbalized understanding;Returned demonstration;Need further instruction          SLP Short Term Goals - 08/23/14 1340    SLP SHORT TERM GOAL #1   Title pt will demo 18/20 sentence responses at 69dB over 2 sessions   Time 4   Period Weeks   Status On-going   SLP SHORT TERM GOAL #2   Title pt will engage in 5 minutes simple conversation with average 70dB over two sessions   Time 4   Period Weeks   Status On-going   SLP SHORT TERM GOAL #3   Title pt will maintain average loud /a/ 83dB over 4 sessions   Time 4   Period Weeks   Status On-going          SLP Long Term Goals - 08/23/14 1342    SLP LONG TERM GOAL #1   Title pt will demo 8 minutes mod complex conversation with at least average 69dB over 2 sessions  Time 8   Period Weeks   Status On-going   SLP LONG TERM GOAL #2   Title pt will report she feels more confident about her voice quality (less "croaky voice") when talking outside of clinic    Time 8   Period Weeks   Status On-going   SLP LONG TERM GOAL #3   Title pt will maintain loud /a/ at average 83 dB over 6 sessions   Time 8   Period Weeks   Status Revised          Plan - 08/23/14 1340    Clinical Impression Statement Requires cont skilled ST to incr WNL loudness in conversation. Minimal carryover to unstructured tasks/conversation.   Speech Therapy Frequency 2x / week   Duration --  8 weeks   Treatment/Interventions Compensatory techniques;Internal/external aids;SLP instruction and feedback;Functional tasks;Patient/family education   Potential to Achieve Goals Good   Consulted and Agree  with Plan of Care Patient        Problem List Patient Active Problem List   Diagnosis Date Noted  . Depression 11/22/2013  . 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 [place], [place], [address] Phone: [phone]   Fax:  [phone]

## 2014-08-23 NOTE — Therapy (Signed)
[facility] [address] [place], [place], [address] Phone: [phone]   Fax:  [phone]  Physical Therapy Treatment  Patient Details  Name: Elizabeth Mcintosh MRN: [id number] Date of Birth: 04/22/1946 Referring Provider:  Creola Cornusso, John, MD  Encounter Date: 08/23/2014      PT End of Session - 08/23/14 1225    Visit Number 2   Number of Visits 9   Date for PT Re-Evaluation 09/30/14   Authorization Type [employer] G-code 10th visit   PT Start Time 1020   PT Stop Time 1100   PT Time Calculation (min) 40 min   Activity Tolerance Patient tolerated treatment well   Behavior During Therapy [facility] for tasks assessed/performed      Past Medical History  Diagnosis Date  . Aneurysm     s/p clips  . Glaucoma   . Parkinson's disease     Past Surgical History  Procedure Laterality Date  . Craniotomy    . Aneurym clipping      There were no vitals filed for this visit.  Visit Diagnosis:  Parkinson's disease      Subjective Assessment - 08/23/14 1027    Symptoms Pt states no changes since evaluation.  Denies falls.   Currently in Pain? No/denies                       [facility] Treatment/Exercise - 08/23/14 1032    Transfers   Transfers Sit to Stand;Stand to Sit   Sit to Stand Without upper extremity assist;From chair/3-in-1;Other/comment;7: Independent  from 20" mat and 18" chair x 10 each-not posterior lean   Stand to Sit 7: Independent   Knee/Hip Exercises: Aerobic   Stationary Bike Scifit level 2.0 all 4 extremities x 8 minutes with rpm>70           [facility] - 08/23/14 1112    PWR! exercises Moves in prone   PWR! Up 20   PWR! Rock 10   PWR! Twist 20   PWR! Step 10   Comments Pt was fatigued and lost balance when scooting off mat at end of exercises  Therapist assisted pt with regaining balance.   PWR! Up 20   PWR! Rock 20   PWR! Twist 20   PWR! Step 10              PT Education - 08/23/14 1224    Education provided Yes   Education Details PWR! supine and standing, importance of extensor strengthening with trainer   Person(s) Educated Patient   Methods Explanation;Demonstration;Verbal cues;Handout   Comprehension Verbalized understanding             PT Long Term Goals - 08/02/14 1316    PT LONG TERM GOAL #1   Title Pt will be independent with HEP for improved balance, transfers, and gait.   Time 4   Period Weeks   Status New   PT LONG TERM GOAL #2   Title Pt will improve 5x sit<>stand transfers to less than or equal to 15 seconds for improved transfer safety and efficiency.   Time 4   Period Weeks   Status New   PT LONG TERM GOAL #3   Title Pt will improve Functional Gait Assessment to at least 20/30 for decreased fall risk.   Time 4   Period Weeks   PT LONG TERM GOAL #4   Title Pt will verbalize at least 25% improvement in bed  mobility and squats for improved functional mobility.   Time 4   Period Weeks   Status New   PT LONG TERM GOAL #5   Title Pt will verbalize understanding of fall prevention within the home environment.   Time 4   Period Weeks   Status New               Plan - 08/23/14 1226    Clinical Impression Statement Needs cues for intensity of movements.  Continue PT per POC.   Pt will benefit from skilled therapeutic intervention in order to improve on the following deficits Abnormal gait;Decreased balance;Decreased strength;Difficulty walking;Decreased mobility;Postural dysfunction   Rehab Potential Good   PT Frequency 2x / week   PT Duration 4 weeks   PT Treatment/Interventions ADLs/Self Care Home Management;Therapeutic activities;Functional mobility training;Stair training;Gait training;Therapeutic exercise;Balance training;Neuromuscular re-education;Patient/family education   PT Next Visit Plan sit to stand from simulated couch, review PWR! supine and standing, squating to pick up objects   Consulted  and Agree with Plan of Care Patient        Problem List Patient Active Problem List   Diagnosis Date Noted  . Depression 11/22/2013  . 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 [address] [place], [place], [address] Phone: [phone]   Fax:  [phone]     Maxcine [facility] 08/23/2014 12:29 PM Phone: [phone] Fax: [phone]

## 2014-08-23 NOTE — Patient Instructions (Signed)
  Please complete the assigned speech therapy homework prior to your next session.  

## 2014-08-28 ENCOUNTER — Ambulatory Visit: Payer: Medicare PPO

## 2014-08-28 ENCOUNTER — Ambulatory Visit: Payer: Medicare PPO | Admitting: Physical Therapy

## 2014-08-28 ENCOUNTER — Encounter: Payer: Self-pay | Admitting: Occupational Therapy

## 2014-08-28 ENCOUNTER — Ambulatory Visit: Payer: Medicare PPO | Admitting: Occupational Therapy

## 2014-08-28 DIAGNOSIS — R293 Abnormal posture: Secondary | ICD-10-CM | POA: Diagnosis not present

## 2014-08-28 DIAGNOSIS — G20 Parkinson's disease: Secondary | ICD-10-CM

## 2014-08-28 DIAGNOSIS — R258 Other abnormal involuntary movements: Secondary | ICD-10-CM

## 2014-08-28 DIAGNOSIS — R29898 Other symptoms and signs involving the musculoskeletal system: Secondary | ICD-10-CM

## 2014-08-28 DIAGNOSIS — R490 Dysphonia: Secondary | ICD-10-CM

## 2014-08-28 DIAGNOSIS — R279 Unspecified lack of coordination: Secondary | ICD-10-CM

## 2014-08-28 NOTE — Therapy (Signed)
[facility] [address] [place], [place], [address] Phone: [phone]   Fax:  [phone]  Occupational Therapy Treatment  Patient Details  Name: Elizabeth Mcintosh MRN: [id number] Date of Birth: 05/18/1946 Referring Provider:  Creola Cornusso, John, MD  Encounter Date: 08/28/2014      OT End of Session - 08/28/14 1027    Visit Number 4   Number of Visits 17   Date for OT Re-Evaluation 09/29/14   Authorization Type [employer] PPO, Auth required, G-code needed   Authorization Time Period 6 visits approved from 2/23-09/15/14   Authorization - Visit Number 3   Authorization - Number of Visits 6   OT Start Time 1019   OT Stop Time 1102   OT Time Calculation (min) 43 min   Activity Tolerance Patient tolerated treatment well   Behavior During Therapy [facility] for tasks assessed/performed      Past Medical History  Diagnosis Date  . Aneurysm     s/p clips  . Glaucoma   . Parkinson's disease     Past Surgical History  Procedure Laterality Date  . Craniotomy    . Aneurym clipping      There were no vitals filed for this visit.  Visit Diagnosis:  Bradykinesia  Rigidity  Lack of coordination  Abnormal posture      Subjective Assessment - 08/28/14 1043    Symptoms exercises going ok   Currently in Pain? No/denies                    OT Treatments/Exercises (OP) - 08/28/14 0001    ADLs   Writing Practiced writing with min v.c. for big movments with approx 95% legibility and min decrease in size (but starting size in smaller as well).  Continuous "L" using lined paper with focus on big movements and touching top and bottom lines with min-mod difficulty.           [facility] - 08/28/14 1111    PWR! exercises Moves in supine;Moves in standing   PWR! Up 20   PWR! Rock 20   PWR! Twist 20   PWR! Step 20   Comments cues for large amplitude movements   PWR! Up 20   PWR! Rock 20   PWR! Twist 20   PWR Step 20   then forward 20 reps, side 20 reps at counter             OT Education - 08/28/14 1044    Education Details Instructed in Coordination HEP, Reviewed PWR! moves in prone, quadraped and PWR! hands and pt returned demo each with min-mod v.c. intermittently for big movements   Person(s) Educated Patient   Methods Explanation;Demonstration;Verbal cues;Handout   Comprehension Verbalized understanding;Returned demonstration;Verbal cues required          OT Short Term Goals - 08/01/14 1454    OT SHORT TERM GOAL #1   Title Pt will be independent with PD-specific HEP.--ck 08/30/14   Time 4   Period Weeks   Status New   OT SHORT TERM GOAL #2   Title Pt will improve coordination/functional reaching for ADLs as shown by improving score on box and blocks test by at least 5 blocks with RUE.--ck 08/30/14   Baseline R-29 blocks   Time 4   Period Weeks   Status New   OT SHORT TERM GOAL #3   Title Pt will report increased ease with cutting meat, opening containers, fastening bra.--ck 08/30/14   Time  4   Period Weeks   Status New   OT SHORT TERM GOAL #4   Title Pt will verbalize understanding of PD-related community resources and ways to prevent future complications--08/30/14   Time 4   Period Weeks   Status New           OT Long Term Goals - 08/01/14 1505    OT LONG TERM GOAL #1   Title Pt will verbalize understanding of AE/strategies to increase ease/independence with ADLs/IADLs prn.--ck 09/29/14   Time 8   Period Weeks   Status New   OT LONG TERM GOAL #2   Title Pt will improve coordination/functional reaching for ADLs as shown by improving score on box and blocks test by at least 10 blocks with RUE.--ck 09/29/14   Baseline R-29 blocks   Time 8   Period Weeks   Status New   OT LONG TERM GOAL #3   Title Pt will improve coordination/functional reaching for ADLs as shown by improving score on box and blocks test by at least 5 blocks with LUE.--ck 09/29/14   Baseline L-38 blocks    Time 8   Period Weeks   Status New   OT LONG TERM GOAL #4   Title Pt will improve coordination for ADLs as shown by improving time on 9-hole peg test by at least 5 sec with RUE.--ck 09/29/14   Baseline R-37.56sec   Time 8   Period Weeks   Status New   OT LONG TERM GOAL #5   Title Pt will improve ability/ease with eating as shown by improving time on PPT#2 by at least 5sec.--ck 09/29/14   Baseline 19.32sec   Time 8   Period Weeks   Status New               Plan - 08/28/14 1112    Clinical Impression Statement Pt continues to need cueing for increasing movment amplitude with continuous movements which will make it difficult to continue repetitive tasks (such as stirring, writing, buttoning, etc.).  However, pt typically only needs cueing for reps 5-10+ now.  Pt would benefit from continued reinforcement of big amplitude movements to improve time/efficiency of ADLs and maintain quality of life.   Plan review coordination HEP, check STGs, big movements for ADLs, request more visits from [facility]   OT Home Exercise Plan Pt has been issued HEP for:  PWR! hands, PWR! moves in prone and quadraped, coordination HEP        Problem List Patient Active Problem List   Diagnosis Date Noted  . Depression 11/22/2013  . 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 [place], [place], [address] Phone: [phone]   Fax:  [phone]   Willa Frater, OTR/L 08/28/2014 12:46 PM

## 2014-08-28 NOTE — Patient Instructions (Signed)
  Please complete the assigned speech therapy homework prior to your next session.  

## 2014-08-28 NOTE — Therapy (Signed)
[facility] [address] [place], [place], [address] Phone: [phone]   Fax:  [phone]  Speech Language Pathology Treatment  Patient Details  Name: Elizabeth Mcintosh MRN: [id number] Date of Birth: [birth year] Referring Provider:  Creola Corn, MD  Encounter Date: 08/28/2014      End of Session - 08/28/14 1015    Visit Number 4   Number of Visits 16   Date for SLP Re-Evaluation 09/29/14   Authorization Type humana   Authorization Time Period 2-23 to 09-30-14   Authorization - Visit Number 3   Authorization - Number of Visits 8   SLP Start Time [phone]   SLP Stop Time  1016   SLP Time Calculation (min) 43 min   Activity Tolerance Patient tolerated treatment well      Past Medical History  Diagnosis Date  . Aneurysm     s/p clips  . Glaucoma   . Parkinson's disease     Past Surgical History  Procedure Laterality Date  . Craniotomy    . Aneurym clipping      There were no vitals filed for this visit.  Visit Diagnosis: Hypokinetic Parkinsonian dysphonia      Subjective Assessment - 08/28/14 0940    Symptoms Pt entered room speaking at lower than WNL volume.                ADULT SLP TREATMENT - 08/28/14 0941    General Information   Behavior/Cognition Alert;Pleasant mood;Cooperative   Treatment Provided   Treatment provided Cognitive-Linquistic   Pain Assessment   Pain Assessment No/denies pain   Cognitive-Linquistic Treatment   Treatment focused on Dysarthria   Skilled Treatment Loud /a/ average 84dB with initial min A to open mouth for /a/ instead of "uh" and for incr'd effort. Sentence responses today req'd mod cues occasionally, for breathing and for loudness. Pt's performance better by session end (80% compared to 60% initially)    Assessment / Recommendations / Plan   Plan Continue with current plan of care   Progression Toward Goals   Progression toward goals Progressing toward goals           SLP Education - 08/28/14 1015    Education Details necessity for louder voice and full breath prior to providing answer   Person(s) Educated Patient   Methods Explanation;Demonstration   Comprehension Verbalized understanding;Returned demonstration;Need further instruction          SLP Short Term Goals - 08/28/14 1016    SLP SHORT TERM GOAL #1   Title pt will demo 18/20 sentence responses at 69dB over 2 sessions   Time 3   Period Weeks   Status On-going   SLP SHORT TERM GOAL #2   Title pt will engage in 5 minutes simple conversation with average 70dB over two sessions   Time 3   Period Weeks   Status On-going   SLP SHORT TERM GOAL #3   Title pt will maintain average loud /a/ 83dB over 4 sessions   Time 3   Period Weeks   Status On-going          SLP Long Term Goals - 08/28/14 1016    SLP LONG TERM GOAL #1   Title pt will demo 8 minutes mod complex conversation with at least average 69dB over 2 sessions   Time 7   Period Weeks   Status On-going   SLP LONG TERM GOAL #2   Title pt will report she feels  more confident about her voice quality (less "croaky voice") when talking outside of clinic    Time 7   Period Weeks   Status On-going   SLP LONG TERM GOAL #3   Title pt will maintain loud /a/ at average 83 dB over 6 sessions   Time 7   Period Weeks   Status Revised          Plan - 08/28/14 1016    Clinical Impression Statement Requires cont skilled ST to incr WNL loudness in conversation. Minimal carryover to unstructured tasks/conversation.   Speech Therapy Frequency 2x / week   Duration --  7 weeks   Treatment/Interventions Compensatory techniques;Internal/external aids;SLP instruction and feedback;Functional tasks;Patient/family education   Potential to Achieve Goals Good        Problem List Patient Active Problem List   Diagnosis Date Noted  . Depression 11/22/2013  . 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

## 2014-08-28 NOTE — Patient Instructions (Addendum)
Coordination Exercises  Perform the following exercises for 20 minutes 1 times per day. Perform with both hand(s). Perform using big movements.   Flipping Cards: Place deck of cards on the table. Flip cards over by opening your hand big to grasp and then turn your palm up big.  Deal cards: Hold 1/2 or whole deck in your hand. Use thumb to push card off top of deck with one big push.  Rotate ball with fingertips: Pick up with fingers/thumb and move as much as you can with each turn/movement (clockwise and counter-clockwise).  Toss ball from one hand to the other: Toss big/high.  Toss ball in the air and catch with the same hand: Toss big/high.  Pick up 5-10 coins one at a time and hold in palm. Then, move coins from palm to fingertips one at a time to stack.  Practice writing: Slow down, write big, and focus on forming each letter.  Make continuous "L"s working on touching top and bottom of line.  Fasten nuts/bolts or put on bottle caps: Turn as much/as big as you can with each turn.

## 2014-08-28 NOTE — Therapy (Signed)
[facility] [address] [place], [place], [address] Phone: [phone]   Fax:  [phone]  Occupational Therapy Treatment  Patient Details  Name: Elizabeth Mcintosh MRN: [id number] Date of Birth: 09/13/1945 Referring Provider:  Creola Corn, MD  Encounter Date: 08/28/2014      OT End of Session - 08/28/14 1027    Visit Number 4   Number of Visits 17   Date for OT Re-Evaluation 09/29/14   Authorization Type [employer] PPO, Auth required, G-code needed   Authorization Time Period 6 visits approved from 2/23-09/15/14   Authorization - Visit Number 4   Authorization - Number of Visits 6   OT Start Time 1019   OT Stop Time 1102   OT Time Calculation (min) 43 min   Activity Tolerance Patient tolerated treatment well   Behavior During Therapy [facility] for tasks assessed/performed      Past Medical History  Diagnosis Date  . Aneurysm     s/p clips  . Glaucoma   . Parkinson's disease     Past Surgical History  Procedure Laterality Date  . Craniotomy    . Aneurym clipping      There were no vitals filed for this visit.  Visit Diagnosis:  Bradykinesia  Rigidity  Lack of coordination  Abnormal posture      Subjective Assessment - 08/28/14 1043    Symptoms exercises going ok   Currently in Pain? No/denies                    OT Treatments/Exercises (OP) - 08/28/14 0001    ADLs   Writing Practiced writing with min v.c. for big movments with approx 95% legibility and min decrease in size (but starting size in smaller as well).  Continuous "L" using lined paper with focus on big movements and touching top and bottom lines with min-mod difficulty.                OT Education - 08/28/14 1044    Education Details Instructed in Coordination HEP, Reviewed PWR! moves in prone, quadraped and PWR! hands and pt returned demo each with min-mod v.c. intermittently for big movements   Person(s) Educated Patient    Methods Explanation;Demonstration;Verbal cues;Handout   Comprehension Verbalized understanding;Returned demonstration;Verbal cues required          OT Short Term Goals - 08/01/14 1454    OT SHORT TERM GOAL #1   Title Pt will be independent with PD-specific HEP.--ck 08/30/14   Time 4   Period Weeks   Status New   OT SHORT TERM GOAL #2   Title Pt will improve coordination/functional reaching for ADLs as shown by improving score on box and blocks test by at least 5 blocks with RUE.--ck 08/30/14   Baseline R-29 blocks   Time 4   Period Weeks   Status New   OT SHORT TERM GOAL #3   Title Pt will report increased ease with cutting meat, opening containers, fastening bra.--ck 08/30/14   Time 4   Period Weeks   Status New   OT SHORT TERM GOAL #4   Title Pt will verbalize understanding of PD-related community resources and ways to prevent future complications--08/30/14   Time 4   Period Weeks   Status New           OT Long Term Goals - 08/01/14 1505    OT LONG TERM GOAL #1   Title Pt will verbalize understanding of AE/strategies to  increase ease/independence with ADLs/IADLs prn.--ck 09/29/14   Time 8   Period Weeks   Status New   OT LONG TERM GOAL #2   Title Pt will improve coordination/functional reaching for ADLs as shown by improving score on box and blocks test by at least 10 blocks with RUE.--ck 09/29/14   Baseline R-29 blocks   Time 8   Period Weeks   Status New   OT LONG TERM GOAL #3   Title Pt will improve coordination/functional reaching for ADLs as shown by improving score on box and blocks test by at least 5 blocks with LUE.--ck 09/29/14   Baseline L-38 blocks   Time 8   Period Weeks   Status New   OT LONG TERM GOAL #4   Title Pt will improve coordination for ADLs as shown by improving time on 9-hole peg test by at least 5 sec with RUE.--ck 09/29/14   Baseline R-37.56sec   Time 8   Period Weeks   Status New   OT LONG TERM GOAL #5   Title Pt will improve  ability/ease with eating as shown by improving time on PPT#2 by at least 5sec.--ck 09/29/14   Baseline 19.32sec   Time 8   Period Weeks   Status New               Plan - 08/28/14 1112    Clinical Impression Statement Pt continues to need cueing for increasing movment amplitude with continuous movements which will make it difficult to continue repetitive tasks (such as stirring, writing, buttoning, etc.).  However, pt typically only needs cueing for reps 5-10+ now.  Pt would benefit from continued reinforcement of big amplitude movements to improve time/efficiency of ADLs and maintain quality of life.   Plan review coordination HEP, check STGs, big movements for ADLs, [facility]   OT Home Exercise Plan Pt has been issued HEP for:  PWR! hands, PWR! moves in prone and quadraped, coordination HEP        Problem List Patient Active Problem List   Diagnosis Date Noted  . Depression 11/22/2013  . 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

## 2014-08-29 NOTE — Therapy (Signed)
[facility] [address] [place], [place], [address] Phone: [phone]   Fax:  [phone]  Physical Therapy Treatment  Patient Details  Name: Elizabeth Mcintosh MRN: [id number] Date of Birth: 11/30/1945 Referring Provider:  Creola Cornusso, John, MD  Encounter Date: 08/28/2014      PT End of Session - 08/29/14 1405    Visit Number 3   Date for PT Re-Evaluation 09/30/14   Authorization Type [employer] G-code 10th visit   Authorization Time Period 08/01/14-09/15/14   Authorization - Visit Number 2   Authorization - Number of Visits 6   PT Start Time 1106   PT Stop Time 1145   PT Time Calculation (min) 39 min   Activity Tolerance Patient tolerated treatment well   Behavior During Therapy [facility] for tasks assessed/performed      Past Medical History  Diagnosis Date  . Aneurysm     s/p clips  . Glaucoma   . Parkinson's disease     Past Surgical History  Procedure Laterality Date  . Craniotomy    . Aneurym clipping      There were no vitals filed for this visit.  Visit Diagnosis:  Bradykinesia  Rigidity      Subjective Assessment - 08/28/14 1108    Symptoms No new changes, no falls; tired today with PT being the last of the therapies.   Currently in Pain? No/denies                          PWR [facility] - 08/28/14 1111    PWR! exercises Moves in supine;Moves in standing   PWR! Up 20   PWR! Rock 20   PWR! Twist 20   PWR! Step 20   Comments cues for large amplitude movements   PWR! Up 20   PWR! Rock 20   PWR! Twist 20   PWR Step 20  then forward 20 reps, side 20 reps at counter    PWR! Up for posture, PWR! Rock for [name], PWR! Twist for trunk rotation, PWR! Step for initial large amplitude step.  Standing step forward and weightshift x 10 reps, then back step and weightshift x 10 reps.     Pt performs sit<>stand transfers from varied heights (as noted in PT flow sheet from  treatment day 08/28/14), 22 inches, 18 inches, 16 inches, 14 inch soft surface, x 10 reps with cues for increased forward lean, increased momentum for upright standing posture.         PT Long Term Goals - 08/02/14 1316    PT LONG TERM GOAL #1   Title Pt will be independent with HEP for improved balance, transfers, and gait.   Time 4   Period Weeks   Status New   PT LONG TERM GOAL #2   Title Pt will improve 5x sit<>stand transfers to less than or equal to 15 seconds for improved transfer safety and efficiency.   Time 4   Period Weeks   Status New   PT LONG TERM GOAL #3   Title Pt will improve Functional Gait Assessment to at least 20/30 for decreased fall risk.   Time 4   Period Weeks   PT LONG TERM GOAL #4   Title Pt will verbalize at least 25% improvement in bed mobility and squats for improved functional mobility.   Time 4   Period Weeks   Status New   PT LONG TERM GOAL #5  Title Pt will verbalize understanding of fall prevention within the home environment.   Time 4   Period Weeks   Status New               Plan - 08/29/14 1405    Clinical Impression Statement PT continues to provide cues for increased intensity/large amplitude of movement patterns.  Pt will continue to benefit from further skilled PT to address functional mobility, gait and transfers.   Pt will benefit from skilled therapeutic intervention in order to improve on the following deficits Abnormal gait;Decreased balance;Decreased strength;Difficulty walking;Decreased mobility;Postural dysfunction   Rehab Potential Good   PT Frequency 2x / week   PT Duration 4 weeks   PT Treatment/Interventions ADLs/Self Care Home Management;Therapeutic activities;Functional mobility training;Stair training;Gait training;Therapeutic exercise;Balance training;Neuromuscular re-education;Patient/family education   PT Next Visit Plan standing PWR! Moves, gait on treadmill/walking poles, squats to pick up objects    Consulted and Agree with Plan of Care Patient        Problem List Patient Active Problem List   Diagnosis Date Noted  . Depression 11/22/2013  . 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 08/29/2014, 2:08 PM  Briawna Carver Emmons, PT 08/29/2014 2:11 PM Phone: [phone] Fax: [phone]  [facility] [address] [place], [place], [address] Phone: [phone]   Fax:  [phone]

## 2014-08-30 ENCOUNTER — Ambulatory Visit: Payer: Medicare PPO | Admitting: Occupational Therapy

## 2014-08-30 ENCOUNTER — Ambulatory Visit: Payer: Medicare PPO | Admitting: Physical Therapy

## 2014-08-30 ENCOUNTER — Ambulatory Visit: Payer: Medicare PPO

## 2014-08-30 DIAGNOSIS — R269 Unspecified abnormalities of gait and mobility: Secondary | ICD-10-CM

## 2014-08-30 DIAGNOSIS — R293 Abnormal posture: Secondary | ICD-10-CM | POA: Diagnosis not present

## 2014-08-30 DIAGNOSIS — R490 Dysphonia: Secondary | ICD-10-CM

## 2014-08-30 DIAGNOSIS — R258 Other abnormal involuntary movements: Secondary | ICD-10-CM

## 2014-08-30 DIAGNOSIS — R29898 Other symptoms and signs involving the musculoskeletal system: Secondary | ICD-10-CM

## 2014-08-30 DIAGNOSIS — R279 Unspecified lack of coordination: Secondary | ICD-10-CM

## 2014-08-30 NOTE — Therapy (Signed)
[facility] [address] [place], [place], [address] Phone: [phone]   Fax:  [phone]  Physical Therapy Treatment  Patient Details  Name: Elizabeth Mcintosh MRN: [id number] Date of Birth: 07/24/1945 Referring Provider:  Creola Cornusso, John, MD  Encounter Date: 08/30/2014      PT End of Session - 08/30/14 2252    Visit Number 4   Number of Visits 9   Date for PT Re-Evaluation 09/30/14   Authorization Type [employer] G-code 10th visit   Authorization Time Period 08/01/14-09/15/14   Authorization - Visit Number 3   Authorization - Number of Visits 6   PT Start Time 1019   PT Stop Time 1103   PT Time Calculation (min) 44 min   Activity Tolerance Patient tolerated treatment well   Behavior [facility] Therapy [facility] for tasks assessed/performed      Past Medical History  Diagnosis Date  . Aneurysm     s/p clips  . Glaucoma   . Parkinson's disease     Past Surgical History  Procedure Laterality Date  . Craniotomy    . Aneurym clipping      There were no vitals filed for this visit.  Visit Diagnosis:  Abnormality of gait  Bradykinesia      Subjective Assessment - 08/30/14 1022    Symptoms No changes.  [facility] [employer] [facility] 2-3x/week.   Currently in Pain? No/denies                       [facility] Adult [facility] Treatment/Exercise - 08/30/14 2243    Ambulation/Gait   Ambulation/Gait Yes   Ambulation/Gait Assistance 4: Min assist   Ambulation/Gait Assistance Details PTA holding end of walking poles to assist with arm swing and encourage increased intensity and amplitude   Ambulation Distance (Feet) 800 Feet  plus   Assistive device Other (Comment)  walking poles with pt holding one end and PTA holding other   Gait Pattern Decreased step length - right;Decreased dorsiflexion - right;Decreased trunk rotation  no arm swing   Ambulation Surface Level;Indoor   Knee/Hip Exercises: Aerobic   Tread Mill  Treadmill using gait trainer x 10 minutes with decreased R step length (approx 73 cm on L and 63 cm on R).  With cues, pt able to improve RLE step length but with cognitive task, quickly reverts back to decreased step.   Ankle Exercises: Seated   Other Seated Ankle Exercises R dorsiflexion with red theraband x 10 reps x 2           [facility] - 08/30/14 2241    PWR! exercises Moves in sitting;Moves in standing   PWR! Up 20   PWR! Rock 20   PWR! Twist 20   PWR Step 20   Comments cues for large amplitude;performed several twist reps in front of mirror for visual cues   PWR! Up 20   PWR! Rock 20   PWR! Twist 20   PWR! Step 20   Comments cues for large amplitude, intensity and proper technique         LSVT [facility] - 08/30/14 2248    Step and Reach Forward Other reps (comment)  20   Step and Reach Backward Other reps (comment)  20   Other Tasks 1 stepping forward to backward in one motion with UE support  Cues for intensity            PT Education - 08/30/14 2250  Education provided Yes   Education Details R dorsiflexion, arm swing with gait, intensity of gait, using mirror for visual cues   Person(s) Educated Patient   Methods Explanation;Demonstration;Handout   Comprehension Verbalized understanding;Verbal cues required             PT Long Term Goals - 08/02/14 1316    PT LONG TERM GOAL #1   Title Pt will be independent with HEP for improved balance, transfers, and gait.   Time 4   Period Weeks   Status New   PT LONG TERM GOAL #2   Title Pt will improve 5x sit<>stand transfers to less than or equal to 15 seconds for improved transfer safety and efficiency.   Time 4   Period Weeks   Status New   PT LONG TERM GOAL #3   Title Pt will improve Functional Gait Assessment to at least 20/30 for decreased fall risk.   Time 4   Period Weeks   PT LONG TERM GOAL #4   Title Pt will verbalize at least 25% improvement in bed mobility and squats for improved  functional mobility.   Time 4   Period Weeks   Status New   PT LONG TERM GOAL #5   Title Pt will verbalize understanding of fall prevention within the home environment.   Time 4   Period Weeks   Status New               Plan - 08/30/14 2252    Clinical Impression Statement Pt continues to need cues for intensity and amplitude.  Decreased insight at times into deficits.  Continue PT per POC.   Pt will benefit from skilled therapeutic intervention in order to improve on the following deficits Abnormal gait;Decreased balance;Decreased strength;Difficulty walking;Decreased mobility;Postural dysfunction   Rehab Potential Good   PT Frequency 2x / week   PT Duration 4 weeks   PT Treatment/Interventions ADLs/Self Care Home Management;Therapeutic activities;Functional mobility training;Stair training;Gait training;Therapeutic exercise;Balance training;Neuromuscular re-education;Patient/family education   PT Next Visit Plan squats to pick up objects, gait with intensity, review R dorsiflexion   Consulted and Agree with Plan of Care Patient        Problem List Patient Active Problem List   Diagnosis Date Noted  . Depression 11/22/2013  . 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

## 2014-08-30 NOTE — Therapy (Signed)
[facility] [address] [place], [place], [address] Phone: [phone]   Fax:  [phone]  Speech Language Pathology Treatment  Patient Details  Name: Elizabeth Mcintosh MRN: [id number] Date of Birth: [birth year] Referring Provider:  Creola Corn, MD  Encounter Date: 08/30/2014      End of Session - 08/30/14 1339    Visit Number 5   Number of Visits 16   Date for SLP Re-Evaluation 09/29/14   Authorization Type humana   Authorization Time Period 2-23 to 09-30-14   Authorization - Visit Number 4   Authorization - Number of Visits 8   SLP Start Time 1148   SLP Stop Time  1230   SLP Time Calculation (min) 42 min   Activity Tolerance Patient tolerated treatment well      Past Medical History  Diagnosis Date  . Aneurysm     s/p clips  . Glaucoma   . Parkinson's disease     Past Surgical History  Procedure Laterality Date  . Craniotomy    . Aneurym clipping      There were no vitals filed for this visit.  Visit Diagnosis: Hypokinetic Parkinsonian dysphonia      Subjective Assessment - 08/30/14 1153    Symptoms "My family noticed me talking louder before dinner last night."               ADULT SLP TREATMENT - 08/30/14 1147    General Information   Behavior/Cognition Alert;Cooperative;Pleasant mood   Treatment Provided   Treatment provided Cognitive-Linquistic   Pain Assessment   Pain Assessment No/denies pain   Cognitive-Linquistic Treatment   Treatment focused on Dysarthria   Skilled Treatment Loud /a/ average 83dB with min-mod A rarely for loudness. Sentence tasks completed with usual min-mod A for loudness, incr'd effort, and decr'd frequency of poor vocal quality (due to lack of abdominal push). By end of session pt able to self correct 50% of the time from 0% of the time initially.    Assessment / Recommendations / Plan   Plan Continue with current plan of care   Progression Toward Goals    Progression toward goals Progressing toward goals          SLP Education - 08/30/14 1338    Education provided Yes   Education Details poor vocal quality indicative of decr'd abdominal push   Person(s) Educated Patient   Methods Explanation;Demonstration;Other (comment)  direct ID by SLP of vocal fry   Comprehension Verbalized understanding;Returned demonstration          SLP Short Term Goals - 08/30/14 1341    SLP SHORT TERM GOAL #1   Title pt will demo 18/20 sentence responses at 69dB over 2 sessions   Time 3   Period Weeks   Status On-going   SLP SHORT TERM GOAL #2   Title pt will engage in 5 minutes simple conversation with average 70dB over two sessions   Time 3   Period Weeks   Status On-going   SLP SHORT TERM GOAL #3   Title pt will maintain average loud /a/ 83dB over 4 sessions   Time 3   Period Weeks   Status On-going          SLP Long Term Goals - 08/30/14 1342    SLP LONG TERM GOAL #1   Title pt will demo 8 minutes mod complex conversation with at least average 69dB over 2 sessions   Time 7   Period Weeks  Status On-going   SLP LONG TERM GOAL #2   Title pt will report she feels more confident about her voice quality (less "croaky voice") when talking outside of clinic    Time 7   Period Weeks   Status On-going   SLP LONG TERM GOAL #3   Title pt will maintain loud /a/ at average 83 dB over 6 sessions   Time 7   Period Weeks   Status Revised          Plan - 08/30/14 1340    Clinical Impression Statement Pt beginning ot self correct poor vocal quality indicative of reduced abdominal push/breath support   Speech Therapy Frequency 2x / week   Duration --  6 weeks   Treatment/Interventions Compensatory techniques;Internal/external aids;SLP instruction and feedback;Functional tasks;Patient/family education   Potential to Achieve Goals Good   Potential Considerations Severity of impairments        Problem List Patient Active Problem List    Diagnosis Date Noted  . Depression 11/22/2013  . 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

## 2014-08-30 NOTE — Therapy (Signed)
[facility] [address] [place], [place], [address] Phone: [phone]   Fax:  [phone]  Occupational Therapy Treatment  Patient Details  Name: Elizabeth Mcintosh MRN: [id number] Date of Birth: 1946/03/14 Referring Provider:  Shon Baton, MD  Encounter Date: 08/30/2014      OT End of Session - 08/30/14 1105    Visit Number 5   Number of Visits 17   Date for OT Re-Evaluation 09/29/14   Authorization Type [employer] PPO, Auth required, G-code needed   Authorization Time Period 6 visits approved from 2/23-09/15/14   Authorization - Visit Number 4   Authorization - Number of Visits 6   OT Start Time 1103   OT Stop Time 1145   OT Time Calculation (min) 42 min   Activity Tolerance Patient tolerated treatment well      Past Medical History  Diagnosis Date  . Aneurysm     s/p clips  . Glaucoma   . Parkinson's disease     Past Surgical History  Procedure Laterality Date  . Craniotomy    . Aneurym clipping      There were no vitals filed for this visit.  Visit Diagnosis:  Rigidity  Bradykinesia  Lack of coordination      Subjective Assessment - 08/30/14 1105    Currently in Pain? No/denies            Treatment: Therapist checked progress towards short term goals. 2/4 met, pt is progressing towards remaining goals. Education provided regarding ways to prevent future complications, and community resources. Pt was shown PD wear/ care kit from [employer]. Dynamic step and reach to toss,  scarves to target with bilateral UE's, min -mod v.c. for large amplitude movements in prep for ADLS/IADLs.           PWR [facility]) - 08/30/14 1254    PWR! exercises Moves in [place]! Up 10   PWR! Rock 10   PWR! Twist 10   PWR! Step 10   Comments min v.c. for all exercises               OT Short Term Goals - 08/30/14 1109    OT SHORT TERM GOAL #1   Title Pt will be independent with  PD-specific HEP.--ck 08/30/14   Baseline progressing with coordination exercises, and PWR! ongoing-08/30/14   Time 4   Period Weeks   Status On-going   OT SHORT TERM GOAL #2   Title Pt will improve coordination/functional reaching for ADLs as shown by improving score on box and blocks test by at least 5 blocks with RUE.--ck 08/30/14   Baseline R-29 blocks initally, (08/30/14- 40 blocks)   Time 4   Period Weeks   Status Achieved   OT SHORT TERM GOAL #3   Title Pt will report increased ease with cutting meat, opening containers, fastening bra.--ck 08/30/14   Baseline Pt reports she fastens bra 50%x,, continued difficulty at times with cutting meat, pt opens most containers-08/30/14   Time 4   Period Weeks   Status On-going   OT SHORT TERM GOAL #4   Title Pt will verbalize understanding of PD-related community resources and ways to prevent future complications--08/30/14   Baseline Pt verbalizes understanding.   Time 4   Period Weeks   Status Achieved           OT Long Term Goals - 08/30/14 1121    OT LONG TERM GOAL #1   Title Pt  will verbalize understanding of AE/strategies to increase ease/independence with ADLs/IADLs prn.--ck 09/29/14   Time 8   Period Weeks   Status On-going   OT LONG TERM GOAL #2   Title Pt will improve coordination/functional reaching for ADLs as shown by improving score on box and blocks test by at least 10 blocks with RUE.--ck 09/29/14   Baseline R-29 blocks( met 08/30/14- 40 blocks)   Time 8   Period Weeks   Status Achieved   OT LONG TERM GOAL #3   Title Pt will improve coordination/functional reaching for ADLs as shown by improving score on box and blocks test by at least 5 blocks with LUE.--ck 09/29/14   Baseline L-38 blocks- (met,49 blocks on 08/30/14)   Time 8   Period Weeks   Status Achieved   OT LONG TERM GOAL #4   Title Pt will improve coordination for ADLs as shown by improving time on 9-hole peg test by at least 5 sec with RUE.--ck 09/29/14    Baseline R-37.56sec   Time 8   Period Weeks   Status On-going   OT LONG TERM GOAL #5   Title Pt will improve ability/ease with eating as shown by improving time on PPT#2 by at least 5sec.--ck 09/29/14   Baseline 19.32sec   Time 8   Period Weeks   Status On-going               Plan - 08/30/14 1250    Clinical Impression Statement Pt demonstrates excellent progress, she achieved 2/4 short term goals and is progressing towards remaining goals. Pt can benefit from continued skilled occupational therapy to maximize pt safety and indpendence with ADLs/ IADLS.   Pt will benefit from skilled therapeutic intervention in order to improve on the following deficits (Retired) Decreased balance;Impaired UE functional use;Impaired tone;Decreased strength;Decreased activity tolerance;Decreased endurance;Decreased mobility;Decreased coordination;Decreased knowledge of use of DME   OT Frequency 2x / week   OT Duration 8 weeks   OT Treatment/Interventions Self-care/ADL training;Therapeutic exercise;Neuromuscular education;Moist Heat;Fluidtherapy;Energy conservation;Therapist, nutritional;Therapeutic exercises;Patient/family education;Balance training;Therapeutic activities;Passive range of motion;Manual Therapy;DME and/or AE instruction;Cryotherapy;Ultrasound;Electrical Stimulation   Plan big movments for ADLS, cehck on status of [employer] auth next week.   OT Home Exercise Plan Pt has been issued HEP for:  PWR! hands, PWR! moves in prone and quadraped, coordination HEP   Consulted and Agree with Plan of Care Patient        Problem List Patient Active Problem List   Diagnosis Date Noted  . Depression 11/22/2013  . 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

## 2014-08-30 NOTE — Patient Instructions (Signed)
  Please complete the assigned speech therapy homework prior to your next session.  

## 2014-08-30 NOTE — Patient Instructions (Signed)
ANKLE: Dorsiflexion (Band)   Sit at edge of surface. Place band around top of right foot. Keeping heel on floor, raise toes of banded foot. Hold 3 seconds. Use red band. 10 reps per set, do 2 sets.  Do twice a day.  Copyright  VHI. All rights reserved.

## 2014-09-04 ENCOUNTER — Ambulatory Visit: Payer: Medicare PPO | Admitting: Physical Therapy

## 2014-09-04 ENCOUNTER — Ambulatory Visit: Payer: Medicare PPO

## 2014-09-04 ENCOUNTER — Ambulatory Visit: Payer: Medicare PPO | Admitting: Occupational Therapy

## 2014-09-04 DIAGNOSIS — R269 Unspecified abnormalities of gait and mobility: Secondary | ICD-10-CM

## 2014-09-04 DIAGNOSIS — R490 Dysphonia: Secondary | ICD-10-CM

## 2014-09-04 DIAGNOSIS — R29898 Other symptoms and signs involving the musculoskeletal system: Secondary | ICD-10-CM

## 2014-09-04 DIAGNOSIS — R258 Other abnormal involuntary movements: Secondary | ICD-10-CM

## 2014-09-04 DIAGNOSIS — R279 Unspecified lack of coordination: Secondary | ICD-10-CM

## 2014-09-04 DIAGNOSIS — G20 Parkinson's disease: Secondary | ICD-10-CM

## 2014-09-04 DIAGNOSIS — R293 Abnormal posture: Secondary | ICD-10-CM

## 2014-09-04 NOTE — Patient Instructions (Signed)
Please complete 5 repetitions of loud "ah" twice a day.   Please complete the assigned speech therapy homework prior to your next session.

## 2014-09-04 NOTE — Therapy (Signed)
[facility] [address] [place], [place], [address] Phone: [phone]   Fax:  [phone]  Occupational Therapy Treatment  Patient Details  Name: Elizabeth Mcintosh MRN: [id number] Date of Birth: 1945-11-21 Referring Provider:  Shon Baton, MD  Encounter Date: 09/04/2014      OT End of Session - 09/04/14 1202    Visit Number 6   Number of Visits 17   Date for OT Re-Evaluation 09/29/14   Authorization Type [employer] PPO, Auth required, G-code needed   Authorization Time Period 6 visits approved from 2/23-09/15/14   Authorization - Visit Number 5   Authorization - Number of Visits 6   OT Start Time 1152   OT Stop Time 1230   OT Time Calculation (min) 38 min   Activity Tolerance Patient tolerated treatment well      Past Medical History  Diagnosis Date  . Aneurysm     s/p clips  . Glaucoma   . Parkinson's disease     Past Surgical History  Procedure Laterality Date  . Craniotomy    . Aneurym clipping      There were no vitals filed for this visit.  Visit Diagnosis:  Bradykinesia  Rigidity  Lack of coordination      Subjective Assessment - 09/04/14 1202    Symptoms "I'm trying"   Currently in Pain? No/denies                    OT Treatments/Exercises (OP) - 09/04/14 0001    ADLs   Writing Practiced writin with min-mod v.c. for big movements and use of PWR! hands with approx 95% legibility but min-mod decrease in size.  Continuous "l" on vertical surface to targets with set-up and min cues for big movements.  Pt instructed in ways to practice at home to encourage big movement.   ADL Comments Reviewed use of big movements for ADLs and discussed ways to incorporate in wiping/cleaning, sweeping tasks, and cutting food.  Pt instructed in importance of big movments to help with neuroplasticity and prevent future complications.  Pt verbalized understanding and returned demo with wiping tasks.   Fine  Motor Coordination   Grooved pegs with R hand with min-mod difficulty and increased time and L hand with min difficulty.                  OT Short Term Goals - 08/30/14 1109    OT SHORT TERM GOAL #1   Title Pt will be independent with PD-specific HEP.--ck 08/30/14   Baseline progressing with coordination exercises, and PWR! ongoing-08/30/14   Time 4   Period Weeks   Status On-going   OT SHORT TERM GOAL #2   Title Pt will improve coordination/functional reaching for ADLs as shown by improving score on box and blocks test by at least 5 blocks with RUE.--ck 08/30/14   Baseline R-29 blocks initally, (08/30/14- 40 blocks)   Time 4   Period Weeks   Status Achieved   OT SHORT TERM GOAL #3   Title Pt will report increased ease with cutting meat, opening containers, fastening bra.--ck 08/30/14   Baseline Pt reports she fastens bra 50%x,, continued difficulty at times with cutting meat, pt opens most containers-08/30/14   Time 4   Period Weeks   Status On-going   OT SHORT TERM GOAL #4   Title Pt will verbalize understanding of PD-related community resources and ways to prevent future complications--08/30/14   Baseline Pt verbalizes understanding.  Time 4   Period Weeks   Status Achieved           OT Long Term Goals - 08/30/14 1121    OT LONG TERM GOAL #1   Title Pt will verbalize understanding of AE/strategies to increase ease/independence with ADLs/IADLs prn.--ck 09/29/14   Time 8   Period Weeks   Status On-going   OT LONG TERM GOAL #2   Title Pt will improve coordination/functional reaching for ADLs as shown by improving score on box and blocks test by at least 10 blocks with RUE.--ck 09/29/14   Baseline R-29 blocks( met 08/30/14- 40 blocks)   Time 8   Period Weeks   Status Achieved   OT LONG TERM GOAL #3   Title Pt will improve coordination/functional reaching for ADLs as shown by improving score on box and blocks test by at least 5 blocks with LUE.--ck 09/29/14   Baseline  L-38 blocks- (met,49 blocks on 08/30/14)   Time 8   Period Weeks   Status Achieved   OT LONG TERM GOAL #4   Title Pt will improve coordination for ADLs as shown by improving time on 9-hole peg test by at least 5 sec with RUE.--ck 09/29/14   Baseline R-37.56sec   Time 8   Period Weeks   Status On-going   OT LONG TERM GOAL #5   Title Pt will improve ability/ease with eating as shown by improving time on PPT#2 by at least 5sec.--ck 09/29/14   Baseline 19.32sec   Time 8   Period Weeks   Status On-going               Plan - 09/04/14 1203    Clinical Impression Statement Pt continues to progress towards goals.   Plan big movements for ADLs, coordination, check on status of [employer] auth   OT Home Exercise Plan Pt has been issued HEP for:  PWR! hands, PWR! moves in prone and quadraped, coordination HEP   Consulted and Agree with Plan of Care Patient        Problem List Patient Active Problem List   Diagnosis Date Noted  . Depression 11/22/2013  . 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 [place], [place], [address] Phone: [phone]   Fax:  [place], OTR/L 09/04/2014 12:57 PM

## 2014-09-04 NOTE — Therapy (Signed)
[facility] [address] [place], [place], [address] Phone: [phone]   Fax:  [phone]  Speech Language Pathology Treatment  Patient Details  Name: Elizabeth Mcintosh MRN: [id number] Date of Birth: 07/04/1945 Referring Provider:  Creola Cornusso, John, MD  Encounter Date: 09/04/2014      End of Session - 09/04/14 1100    Visit Number 6   Number of Visits 16   Date for SLP Re-Evaluation 09/29/14   Authorization Type humana   Authorization Time Period 2-23 to 09-30-14   Authorization - Visit Number 5   Authorization - Number of Visits 8   SLP Start Time 1016   SLP Stop Time  1100   SLP Time Calculation (min) 44 min      Past Medical History  Diagnosis Date  . Aneurysm     s/p clips  . Glaucoma   . Parkinson's disease     Past Surgical History  Procedure Laterality Date  . Craniotomy    . Aneurym clipping      There were no vitals filed for this visit.  Visit Diagnosis: Hypokinetic Parkinsonian dysphonia      Subjective Assessment - 09/04/14 1022    Symptoms Pt reports busy weekend and did not complete HEP as directed.               ADULT SLP TREATMENT - 09/04/14 1022    General Information   Behavior/Cognition Alert;Cooperative;Pleasant mood   Treatment Provided   Treatment provided Cognitive-Linquistic   Pain Assessment   Pain Assessment No/denies pain   Cognitive-Linquistic Treatment   Treatment focused on Dysarthria   Skilled Treatment Loud /a/ with average 84 dB with usual min A for loudness. SLP re-educated pt why necessary to complete loud /a/ as directed. Picture description with usual mod A for loudness, average 68dB without cues. SLP cued pt to listen for poor vocal quality (vocal fry, indicating lack of breath support), and pt self corrected 2/7.    Assessment / Recommendations / Plan   Plan Continue with current plan of care   Progression Toward Goals   Progression toward goals  Progressing toward goals          SLP Education - 09/04/14 1100    Education Details re-educated pt on frequency of loud /a/ as directed   Person(s) Educated Patient   Methods Explanation   Comprehension Verbalized understanding          SLP Short Term Goals - 09/04/14 1102    SLP SHORT TERM GOAL #1   Title pt will demo 18/20 sentence responses at 69dB over 2 sessions   Time 2   Period Weeks   Status On-going   SLP SHORT TERM GOAL #2   Title pt will engage in 5 minutes simple conversation with average 70dB   Time 2   Period Weeks   Status Revised   SLP SHORT TERM GOAL #3   Title pt will maintain average loud /a/ 83dB over 4 sessions   Time 2   Period Weeks   Status On-going          SLP Long Term Goals - 09/04/14 1103    SLP LONG TERM GOAL #1   Title pt will demo 8 minutes mod complex conversation with at least average 69dB over 2 sessions   Time 6   Period Weeks   Status On-going   SLP LONG TERM GOAL #2   Title pt will report she feels more confident  about her voice quality (less "croaky voice") when talking outside of clinic    Time 6   Period Weeks   Status On-going   SLP LONG TERM GOAL #3   Title pt will maintain loud /a/ at average 83 dB over 6 sessions   Time 6   Period Weeks   Status Revised          Plan - 09/04/14 1102    Clinical Impression Statement Pt beginning oto self correct poor vocal quality indicative of reduced abdominal push/breath support   Speech Therapy Frequency 2x / week   Duration --  5 weeks   Treatment/Interventions Compensatory techniques;Internal/external aids;SLP instruction and feedback;Functional tasks;Patient/family education   Potential to Achieve Goals Good   Potential Considerations Severity of impairments        Problem List Patient Active Problem List   Diagnosis Date Noted  . Depression 11/22/2013  . 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 [place], [place], [address] Phone: [phone]   Fax:  [phone]

## 2014-09-05 NOTE — Therapy (Signed)
[facility] [address] [place], [place], [address] Phone: [phone]   Fax:  [phone]  Physical Therapy Treatment  Patient Details  Name: Elizabeth Mcintosh MRN: [id number] Date of Birth: 01-12-1946 Referring Provider:  Shon Baton, MD  Encounter Date: 09/04/2014      PT End of Session - 09/05/14 1409    Visit Number 5   Number of Visits 9   Date for PT Re-Evaluation 10/05/14   Authorization Type [employer] G-code 10th visit   Authorization Time Period 08/01/14-09/15/14  PT requesting additional visits per renewal this visit   Authorization - Visit Number 4   Authorization - Number of Visits 6   PT Start Time 1103   PT Stop Time 1145   PT Time Calculation (min) 42 min   Equipment Utilized During Treatment Gait belt   Activity Tolerance Patient tolerated treatment well   Behavior During Therapy [facility] for tasks assessed/performed      Past Medical History  Diagnosis Date  . Aneurysm     s/p clips  . Glaucoma   . Parkinson's disease     Past Surgical History  Procedure Laterality Date  . Craniotomy    . Aneurym clipping      There were no vitals filed for this visit.  Visit Diagnosis:  Rigidity  Bradykinesia  Abnormality of gait  Abnormal posture      Subjective Assessment - 09/04/14 1103    Symptoms Recovering from having family over to my home for Easter   Currently in Pain? No/denies    Pt reports improvement in bed mobility by at least 50% with practice of PWR! Moves in supine position.  Discussed plan of care, including need to assess goals today and request reauthorization through [facility], due to 6 visits initially being authorized.       Neuro Re-education: -5x sit<>stand 16.81 seconds, then 14.84 seconds, improved from 19.94 with episodes of posterior lean at eval. -Functional Gait Assessment score:  19/30, improved from 16/30  (Scores <22/30 indicate increased fall risk).  With gait,  pt ambulates with forward flexed posture, with decreased hip/knee flexion/decreased ankle dorsiflexion with decreased foot clearance on RLE.   -Four Square Step test (FSST):  13.81 seconds with narrow BOS.  Multiple and repeated FSST with min guard assistance, with cues for large amplitude step length for increased step height and increased step length, for improved dynamic balance.  At counter:  Back step and weightshifting x 10 reps, then at steps:  Forward step tap to 6 inch step, then back step and weightshift x 10 reps each side with UE support and supervision, for improved weightshifting excursion in anterior/posterior direction.  There Ex:  Pt c/o cramping at night in RLE.  Also, with gait, pt is noted to have decreased full knee extension in stance phase with decreased R foot clearance.  Initiated stretches for RLE:  standing heelcord stretch 3 x 30 seconds, standing runner's stretch for hamstrings/heelcords 3 x 30 seconds; seated hamstring stretch 3 x 30 seconds.  (Added to HEP)                      PT Education - 09/05/14 1409    Education provided Yes   Education Details HEP-addition of heelcord and hamstring stretches   Person(s) Educated Patient   Methods Explanation;Demonstration;Handout   Comprehension Returned demonstration             PT Long Term Goals - 09/05/14  [place] #1   Title Pt will be independent with HEP for improved balance, transfers, and gait. (Target date:  10/05/14-requested renewal and extended POC for 2x/wk additioanl 4 weeks)   Time 4   Period Weeks   Status On-going   PT LONG TERM GOAL #2   Title Pt will improve 5x sit<>stand transfers to less than or equal to 12 seconds for improved transfer safety and efficiency. (Target 10/05/14)   Baseline 09/04/14:  14.84 seconds   Time 4   Period Weeks   Status Revised   PT LONG TERM GOAL #3   Title Pt will improve Functional Gait Assessment to at least 22/30 for decreased fall  risk.   Baseline 09/04/14-19/30   Time 4   Period Weeks   Status Revised   PT LONG TERM GOAL #4   Title Pt will verbalize at least 25% improvement in bed mobility and squats for improved functional mobility.   Status Achieved   PT LONG TERM GOAL #5   Title Pt will verbalize understanding of fall prevention within the home environment. Target 10/05/14   Time 4   Period Weeks   Status On-going   Additional Long Term Goals   Additional Long Term Goals Yes   PT LONG TERM GOAL #6   Title Pt will improve four-square step test to less than or equal to 12 seconds with no loss of balance. (Target 10/05/14)   Baseline 09/04/14-13.81 seconds with min guard and narrow BOS   Time 4   Period Weeks   Status New               Plan - 09/05/14 1411    Clinical Impression Statement Briefly assessed goals today.  LTG #1 and 5 are ongoing-to be more fully addressed.  LTG #2 has been met, with 5x sit<>stand score 14.84 sec at best.  LTG #3 for Functional Gait assessment not fully met, to be revised.  Pt has met LTG #4.  Today was 4th visit after evaluation, and goals were assessed in order to perform renewal/reauthorization request to [facility].  Pt is progressing towards goals; however, pt continues to be at a fall risk per Functional Gait Assessment scores and pt demonstrates decreased timing and coordination of gait, decreased transfer efficiency and safety, impaired posture and postural stability (balance) due to her Parkinson's disease.  Pt would benefit from further skilled PT to further address these deficits.     Pt will benefit from skilled therapeutic intervention in order to improve on the following deficits Abnormal gait;Decreased balance;Decreased strength;Difficulty walking;Decreased mobility;Postural dysfunction   Rehab Potential Good   PT Frequency 2x / week   PT Duration 6 weeks  4 additional weeks requested per renewal this visit   PT Treatment/Interventions ADLs/Self Care Home  Management;Therapeutic activities;Functional mobility training;Stair training;Gait training;Therapeutic exercise;Balance training;Neuromuscular re-education;Patient/family education   PT Next Visit Plan squats to pick up objects, gait with intensity, review R dorsiflexion; treadmill gait activities   Consulted and Agree with Plan of Care Patient        Problem List Patient Active Problem List   Diagnosis Date Noted  . Depression 11/22/2013  . 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 09/05/2014, 2:21 PM  Amy Gerrit Friends, PT 09/05/2014 2:22 PM Phone: [phone] Fax: [facility] [address] [place], [place], [address] Phone: [phone]   Fax:  [phone]

## 2014-09-05 NOTE — Patient Instructions (Signed)
PT provided handout for patient on standing heelcord stretch using phone book, standing runner's stretch, then seated hamstring stretch, 3 x 30 seconds each, to be performed at least 2-3 times per day.

## 2014-09-06 ENCOUNTER — Ambulatory Visit: Payer: Medicare PPO

## 2014-09-06 ENCOUNTER — Encounter: Payer: Self-pay | Admitting: Physical Therapy

## 2014-09-06 ENCOUNTER — Ambulatory Visit: Payer: Medicare PPO | Admitting: Occupational Therapy

## 2014-09-06 ENCOUNTER — Ambulatory Visit: Payer: Medicare PPO | Admitting: Physical Therapy

## 2014-09-06 DIAGNOSIS — R293 Abnormal posture: Secondary | ICD-10-CM | POA: Diagnosis not present

## 2014-09-06 DIAGNOSIS — R258 Other abnormal involuntary movements: Secondary | ICD-10-CM

## 2014-09-06 DIAGNOSIS — R279 Unspecified lack of coordination: Secondary | ICD-10-CM

## 2014-09-06 DIAGNOSIS — G20 Parkinson's disease: Secondary | ICD-10-CM

## 2014-09-06 DIAGNOSIS — R490 Dysphonia: Secondary | ICD-10-CM

## 2014-09-06 DIAGNOSIS — R29898 Other symptoms and signs involving the musculoskeletal system: Secondary | ICD-10-CM

## 2014-09-06 DIAGNOSIS — R269 Unspecified abnormalities of gait and mobility: Secondary | ICD-10-CM

## 2014-09-06 NOTE — Therapy (Signed)
[facility] [address] [place], [place], [address] Phone: [phone]   Fax:  [phone]  Physical Therapy Treatment  Patient Details  Name: Elizabeth Mcintosh MRN: [id number] Date of Birth: [birth year] Referring Provider:  Creola Corn, MD  Encounter Date: 09/06/2014      PT End of Session - 09/06/14 1311    Visit Number 6   Number of Visits 9   Date for PT Re-Evaluation 10/05/14   Authorization Type [employer] Medicare G-code 10th visit   Authorization Time Period 08/01/14-09/15/14   PT Start Time 1019   PT Stop Time 1102   PT Time Calculation (min) 43 min   Equipment Utilized During Treatment Gait belt   Activity Tolerance Patient tolerated treatment well   Behavior During Therapy [facility] for tasks assessed/performed      Past Medical History  Diagnosis Date  . Aneurysm     s/p clips  . Glaucoma   . Parkinson's disease     Past Surgical History  Procedure Laterality Date  . Craniotomy    . Aneurym clipping      There were no vitals filed for this visit.  Visit Diagnosis:  Abnormality of gait      Subjective Assessment - 09/06/14 1300    Symptoms Denies falls or changes since last visit.   Pertinent History akinetic rigid Parkinson's disease   Patient Stated Goals Pt's goal for therapy is to improve flexibility on R side and to improve speech.   Currently in Pain? No/denies                       [facility] Adult PT Treatment/Exercise - 09/06/14 1303    Ambulation/Gait   Ambulation/Gait Yes   Ambulation/Gait Assistance 7: Independent   Ambulation/Gait Assistance Details PTA holding end of walking poles to assist with arm swing and encourage intensity   Ambulation Distance (Feet) 800 Feet   Assistive device Other (Comment)  PTA holding walking poles for part of distance   Gait Pattern Decreased step length - right;Decreased dorsiflexion - right;Decreased trunk rotation  several episodes of LOB with  ambulation due to R foot/toe    Ambulation Surface Level;Indoor   Knee/Hip Exercises: Aerobic   Tread Mill treadmill using gait trainer x 8 minutes and 2 minutes kicking blue therapy ball to encourage R step length and R heel strike   Knee/Hip Exercises: Standing   Lateral Step Up Both;15 reps;Hand Hold: 2;Step Height: 6"   Forward Step Up Both;15 reps;Hand Hold: 2;Step Height: 6"   Step Down Both;15 reps;Hand Hold: 2;Step Height: 6"   Other Standing Knee Exercises R LE hip extension, marching, hip abduction all x 15 with 3# weight   Ankle Exercises: Seated   Other Seated Ankle Exercises R dorsiflexion with red theraband x 10 reps                PT Education - 09/05/14 1409    Education provided Yes   Education Details HEP-addition of heelcord and hamstring stretches   Person(s) Educated Patient   Methods Explanation;Demonstration;Handout   Comprehension Returned demonstration             PT Long Term Goals - 09/05/14 1415    PT LONG TERM GOAL #1   Title Pt will be independent with HEP for improved balance, transfers, and gait. (Target date:  10/05/14-requested renewal and extended POC for 2x/wk additioanl 4 weeks)   Time 4   Period Weeks  Status On-going   PT LONG TERM GOAL #2   Title Pt will improve 5x sit<>stand transfers to less than or equal to 12 seconds for improved transfer safety and efficiency. (Target 10/05/14)   Baseline 09/04/14:  14.84 seconds   Time 4   Period Weeks   Status Revised   PT LONG TERM GOAL #3   Title Pt will improve Functional Gait Assessment to at least 22/30 for decreased fall risk.   Baseline 09/04/14-19/30   Time 4   Period Weeks   Status Revised   PT LONG TERM GOAL #4   Title Pt will verbalize at least 25% improvement in bed mobility and squats for improved functional mobility.   Status Achieved   PT LONG TERM GOAL #5   Title Pt will verbalize understanding of fall prevention within the home environment. Target 10/05/14   Time 4    Period Weeks   Status On-going   Additional Long Term Goals   Additional Long Term Goals Yes   PT LONG TERM GOAL #6   Title Pt will improve four-square step test to less than or equal to 12 seconds with no loss of balance. (Target 10/05/14)   Baseline 09/04/14-13.81 seconds with min guard and narrow BOS   Time 4   Period Weeks   Status New               Plan - 09/06/14 1312    Clinical Impression Statement Pt continues with poor heel strike and step length on R.  Continue PT per POC.   Pt will benefit from skilled therapeutic intervention in order to improve on the following deficits Abnormal gait;Decreased balance;Decreased strength;Difficulty walking;Decreased mobility;Postural dysfunction   Rehab Potential Good   PT Frequency 2x / week   PT Duration 6 weeks  awaiting additional visits from insurance   PT Treatment/Interventions ADLs/Self Care Home Management;Therapeutic activities;Functional mobility training;Stair training;Gait training;Therapeutic exercise;Balance training;Neuromuscular re-education;Patient/family education   PT Next Visit Plan squats to pick up objects, gait with intensity   Consulted and Agree with Plan of Care Patient        Problem List Patient Active Problem List   Diagnosis Date Noted  . Depression 11/22/2013  . 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

## 2014-09-06 NOTE — Patient Instructions (Signed)
It is necessary to complete loud "ah" twice a day to achieve muscle memory for louder, normal speech.

## 2014-09-06 NOTE — Therapy (Signed)
[facility] [address] [place], [place], [address] Phone: [phone]   Fax:  [phone]  Occupational Therapy Treatment  Patient Details  Name: Elizabeth Mcintosh MRN: [id number] Date of Birth: 05-14-1946 Referring Provider:  Shon Baton, MD  Encounter Date: 09/06/2014      OT End of Session - 09/06/14 0941    Visit Number 7   Number of Visits 17   Date for OT Re-Evaluation 09/29/14   Authorization Type [employer] PPO, Auth required, G-code needed   Authorization Time Period 6 visits approved from 2/23-09/15/14, additional 6 visits approved 08/31/14-10/15/14   Authorization - Visit Number 6   Authorization - Number of Visits 12   OT Start Time [id number]   OT Stop Time 1015   OT Time Calculation (min) 39 min   Activity Tolerance Patient tolerated treatment well   Behavior During Therapy [facility] for tasks assessed/performed      Past Medical History  Diagnosis Date  . Aneurysm     s/p clips  . Glaucoma   . Parkinson's disease     Past Surgical History  Procedure Laterality Date  . Craniotomy    . Aneurym clipping      There were no vitals filed for this visit.  Visit Diagnosis:  Lack of coordination  Rigidity  Bradykinesia      Subjective Assessment - 09/06/14 0941    Currently in Pain? No/denies                    OT Treatments/Exercises (OP) - 09/06/14 0001    Fine Motor Coordination   Small Pegboard Copying design on vertical surface with RUE, while manipulating 2 pegs at a time in hand, min difficulty/ v.c. for large amplitude movements, and performance  Copying design for cognitive component   Neurological Re-education Exercises   Reciprocal Movements Arm bike x 5 mins, for conditioning, min v.c. for speed, pt maintained 30-40 RPM   Functional Reaching Activities   Mid Level Removing graded clothespins from antennae with RUE to place on target with trunk rotation  min v.c. for large amplitude  movements      Tossing scarves to target with bilateral UE's with emphasis on large amplitude movements, min v.c. Ambulating while tossing scarf between hands and performing category generation, min v.c. / difficulty            OT Short Term Goals - 08/30/14 1109    OT SHORT TERM GOAL #1   Title Pt will be independent with PD-specific HEP.--ck 08/30/14   Baseline progressing with coordination exercises, and PWR! ongoing-08/30/14   Time 4   Period Weeks   Status On-going   OT SHORT TERM GOAL #2   Title Pt will improve coordination/functional reaching for ADLs as shown by improving score on box and blocks test by at least 5 blocks with RUE.--ck 08/30/14   Baseline R-29 blocks initally, (08/30/14- 40 blocks)   Time 4   Period Weeks   Status Achieved   OT SHORT TERM GOAL #3   Title Pt will report increased ease with cutting meat, opening containers, fastening bra.--ck 08/30/14   Baseline Pt reports she fastens bra 50%x,, continued difficulty at times with cutting meat, pt opens most containers-08/30/14   Time 4   Period Weeks   Status On-going   OT SHORT TERM GOAL #4   Title Pt will verbalize understanding of PD-related community resources and ways to prevent future complications--08/30/14   Baseline Pt verbalizes  understanding.   Time 4   Period Weeks   Status Achieved           OT Long Term Goals - 08/30/14 1121    OT LONG TERM GOAL #1   Title Pt will verbalize understanding of AE/strategies to increase ease/independence with ADLs/IADLs prn.--ck 09/29/14   Time 8   Period Weeks   Status On-going   OT LONG TERM GOAL #2   Title Pt will improve coordination/functional reaching for ADLs as shown by improving score on box and blocks test by at least 10 blocks with RUE.--ck 09/29/14   Baseline R-29 blocks( met 08/30/14- 40 blocks)   Time 8   Period Weeks   Status Achieved   OT LONG TERM GOAL #3   Title Pt will improve coordination/functional reaching for ADLs as shown by  improving score on box and blocks test by at least 5 blocks with LUE.--ck 09/29/14   Baseline L-38 blocks- (met,49 blocks on 08/30/14)   Time 8   Period Weeks   Status Achieved   OT LONG TERM GOAL #4   Title Pt will improve coordination for ADLs as shown by improving time on 9-hole peg test by at least 5 sec with RUE.--ck 09/29/14   Baseline R-37.56sec   Time 8   Period Weeks   Status On-going   OT LONG TERM GOAL #5   Title Pt will improve ability/ease with eating as shown by improving time on PPT#2 by at least 5sec.--ck 09/29/14   Baseline 19.32sec   Time 8   Period Weeks   Status On-going               Plan - 09/06/14 1009    Clinical Impression Statement Pt is progressing towards goals. She continues to require v.c for large amplitude movements with RUE.   Plan big movements with ADLS   OT Home Exercise Plan Pt has been issued HEP for:  PWR! hands, PWR! moves in prone and quadraped, coordination HEP   Consulted and Agree with Plan of Care Patient        Problem List Patient Active Problem List   Diagnosis Date Noted  . Depression 11/22/2013  . 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

## 2014-09-06 NOTE — Addendum Note (Signed)
Addended by: Gean MaidensMARRIOTT, Janis Cuffe W on: 09/06/2014 01:31 PM   Modules accepted: Orders

## 2014-09-06 NOTE — Therapy (Signed)
[facility] [address] [place], [place], [address] Phone: [phone]   Fax:  [phone]  Speech Language Pathology Treatment  Patient Details  Name: Elizabeth Mcintosh MRN: [id number] Date of Birth: 01/31/1946 Referring Provider:  Creola Cornusso, John, MD  Encounter Date: 09/06/2014      End of Session - 09/06/14 1142    Visit Number 7   Number of Visits 16   Date for SLP Re-Evaluation 09/29/14   Authorization Type humana   Authorization Time Period 2-23 to 09-30-14   Authorization - Visit Number 6   Authorization - Number of Visits 8   SLP Start Time 1104   SLP Stop Time  1145   SLP Time Calculation (min) 41 min   Activity Tolerance Patient tolerated treatment well      Past Medical History  Diagnosis Date  . Aneurysm     s/p clips  . Glaucoma   . Parkinson's disease     Past Surgical History  Procedure Laterality Date  . Craniotomy    . Aneurym clipping      There were no vitals filed for this visit.  Visit Diagnosis: Hypokinetic Parkinsonian dysphonia      Subjective Assessment - 09/06/14 1106    Symptoms "Ah"s were good." Pt did not complete structured tasks as prescribed.               ADULT SLP TREATMENT - 09/06/14 1106    General Information   Behavior/Cognition Alert;Cooperative;Pleasant mood   Treatment Provided   Treatment provided Cognitive-Linquistic   Pain Assessment   Pain Assessment No/denies pain   Cognitive-Linquistic Treatment   Treatment focused on Dysarthria   Skilled Treatment Loud /a/ average 83dB with mod A to remain loud and give necessary effort. SLP encouraged pt to perform loud /a/ as prescribed in order to afford muscle memory and require less SLP A during loud /a/. Sentence responses pointing the safety hazzards in pictures with consistent min-mod A for loudness. In tasks explaining proverbs, pt req'd mod A occasionally to achieve normal loudness. SLP again stressed to  pt to perform loud /a/ BID.   Assessment / Recommendations / Plan   Plan Continue with current plan of care   Progression Toward Goals   Progression toward goals Progressing toward goals          SLP Education - 09/06/14 1142    Education provided Yes   Education Details need to complete "ah"s twice a day   Person(s) Educated Patient   Methods Explanation;Demonstration   Comprehension Verbalized understanding          SLP Short Term Goals - 09/06/14 1144    SLP SHORT TERM GOAL #1   Title pt will demo 18/20 sentence responses at 69dB over 2 sessions   Time 2   Period Weeks   Status On-going   SLP SHORT TERM GOAL #2   Title pt will engage in 5 minutes simple conversation with average 70dB   Time 2   Period Weeks   Status Revised   SLP SHORT TERM GOAL #3   Title pt will maintain average loud /a/ 83dB over 4 sessions   Time 2   Period Weeks   Status On-going          SLP Long Term Goals - 09/06/14 1144    SLP LONG TERM GOAL #1   Title pt will demo 8 minutes mod complex conversation with at least average 69dB over 2 sessions  Time 6   Period Weeks   Status On-going   SLP LONG TERM GOAL #2   Title pt will report she feels more confident about her voice quality (less "croaky voice") when talking outside of clinic    Time 6   Period Weeks   Status On-going   SLP LONG TERM GOAL #3   Title pt will maintain loud /a/ at average 83 dB over 6 sessions   Time 6   Period Weeks   Status Revised          Plan - 09/06/14 1143    Clinical Impression Statement HEP not being completed at home as directed is resulting in slower than normal progress. Pt made aware of this today by SLP.   Speech Therapy Frequency 2x / week   Duration --  5 weeks   Treatment/Interventions Compensatory techniques;Internal/external aids;SLP instruction and feedback;Functional tasks;Patient/family education   Potential to Achieve Goals Good   Potential Considerations Severity of impairments         Problem List Patient Active Problem List   Diagnosis Date Noted  . Depression 11/22/2013  . 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

## 2014-09-11 ENCOUNTER — Ambulatory Visit: Payer: Medicare PPO

## 2014-09-11 ENCOUNTER — Encounter: Payer: Self-pay | Admitting: Occupational Therapy

## 2014-09-11 ENCOUNTER — Ambulatory Visit: Payer: Medicare PPO | Admitting: Occupational Therapy

## 2014-09-11 ENCOUNTER — Ambulatory Visit: Payer: Medicare PPO | Attending: Neurology | Admitting: Rehabilitative and Restorative Service Providers"

## 2014-09-11 DIAGNOSIS — R29898 Other symptoms and signs involving the musculoskeletal system: Secondary | ICD-10-CM

## 2014-09-11 DIAGNOSIS — R2689 Other abnormalities of gait and mobility: Secondary | ICD-10-CM | POA: Diagnosis present

## 2014-09-11 DIAGNOSIS — G20 Parkinson's disease: Secondary | ICD-10-CM

## 2014-09-11 DIAGNOSIS — R258 Other abnormal involuntary movements: Secondary | ICD-10-CM | POA: Insufficient documentation

## 2014-09-11 DIAGNOSIS — R293 Abnormal posture: Secondary | ICD-10-CM | POA: Diagnosis not present

## 2014-09-11 DIAGNOSIS — R279 Unspecified lack of coordination: Secondary | ICD-10-CM

## 2014-09-11 DIAGNOSIS — R490 Dysphonia: Secondary | ICD-10-CM | POA: Insufficient documentation

## 2014-09-11 DIAGNOSIS — R269 Unspecified abnormalities of gait and mobility: Secondary | ICD-10-CM | POA: Insufficient documentation

## 2014-09-11 NOTE — Therapy (Signed)
[facility] [address] [place], [place], [address] Phone: [phone]   Fax:  [phone]  Physical Therapy Treatment  Patient Details  Name: Elizabeth LickMary E Mcintosh MRN: [id number] Date of Birth: 11/08/1945 Referring Provider:  Creola Cornusso, John, MD  Encounter Date: 09/11/2014      PT End of Session - 09/11/14 1100    Visit Number 7   Number of Visits 9   Date for PT Re-Evaluation 10/05/14   Authorization Type [employer] G-code 10th visit   Authorization Time Period 08/01/14-09/15/14   PT Start Time 1018   PT Stop Time 1058   PT Time Calculation (min) 40 min   Activity Tolerance Patient tolerated treatment well   Behavior [facility] Therapy [facility] for tasks assessed/performed      Past Medical History  Diagnosis Date  . Aneurysm     s/p clips  . Glaucoma   . Parkinson's disease     Past Surgical History  Procedure Laterality Date  . Craniotomy    . Aneurym clipping      There were no vitals filed for this visit.  Visit Diagnosis:  Abnormality of gait  Lack of coordination  Parkinson's disease  Decreased functional mobility      Subjective Assessment - 09/11/14 1021    Subjective The patient is doing HEP regularly.  PT and patient discussed still awaiting further insurance authorization.    Currently in Pain? No/denies      NEUROMUSCULAR RE-EDUCATION: Standing lateral weight shifts with UE movements Standing deep squats working towards picking up items from the floor Diagonal squat reaching for picking up items from the floor Anterior and posterior rocking weight shift with emphasis on R ankle control (dorsiflexion)  Gait: Ambulation with emphasis on large arm swing and R heel strike >400 ft Direction changes with gait forward and backwards emphasizing power moves and longer stride length  THERAPEUTIC EXERCISE: Hamstring stretches seated for R and L hamstrings Squats with a low seat behind the patient (12"  tall) for her to "bump" before returning to stand (patient reports getting up from low seats still challenging day to day and rising from a car)  SELF CARE/HOME MANAGEMENT: Fall prevention discussed for home safety and handout provided from patient instructions. Also discussed [employer] routine of walking track, treadmill.  PT recommended she do upper back strengthening (rows) for postural strengthening.        PT Education - 09/11/14 1053    Education provided Yes   Education Details Fall prevention, reviewed hamstring stretch seated, discussed progression of gym routine.   Person(s) Educated Patient   Methods Explanation;Demonstration   Comprehension Verbalized understanding             PT Long Term Goals - 09/11/14 1102    PT LONG TERM GOAL #1   Title Pt will be independent with HEP for improved balance, transfers, and gait. (Target date:  10/05/14-requested renewal and extended POC for 2x/wk additioanl 4 weeks)   Time 4   Period Weeks   Status On-going   PT LONG TERM GOAL #2   Title Pt will improve 5x sit<>stand transfers to less than or equal to 12 seconds for improved transfer safety and efficiency. (Target 10/05/14)   Baseline 09/04/14:  14.84 seconds   Time 4   Period Weeks   Status Revised   PT LONG TERM GOAL #3   Title Pt will improve Functional Gait Assessment to at least 22/30 for decreased fall risk.   Baseline  09/04/14-19/30   Time 4   Period Weeks   Status Revised   PT LONG TERM GOAL #4   Title Pt will verbalize at least 25% improvement in bed mobility and squats for improved functional mobility.   Status Achieved   PT LONG TERM GOAL #5   Title Pt will verbalize understanding of fall prevention within the home environment. Target 10/05/14   Baseline Reviewed on 09/11/14.   Time 4   Period Weeks   Status Achieved   PT LONG TERM GOAL #6   Title Pt will improve four-square step test to less than or equal to 12 seconds with no loss of balance. (Target 10/05/14)    Baseline 09/04/14-13.81 seconds with min guard and narrow BOS   Time 4   Period Weeks   Status New               Plan - 09/11/14 1100    Clinical Impression Statement Patient verbalizes understanding of fall prevention.  PT focused on R heel strike, longer stride length, faster weight shifting for gait, and picking up objects from the floor.  PT to continue per plan of care checking insurance authorization for further visits.   PT Next Visit Plan gait with large amplitude movements, check insurance authorization   Consulted and Agree with Plan of Care Patient        Problem List Patient Active Problem List   Diagnosis Date Noted  . Depression 11/22/2013  . 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 [place], [place], [address] Phone: [phone]   Fax:  [phone]

## 2014-09-11 NOTE — Patient Instructions (Signed)

## 2014-09-11 NOTE — Therapy (Signed)
[place] [address] [place], [place], [address] Phone: [phone]   Fax:  [phone]  Occupational Therapy Treatment  Patient Details  Name: Elizabeth Mcintosh MRN: [id number] Date of Birth: 04/09/1946 Referring Provider:  Shon Baton, MD  Encounter Date: 09/11/2014      OT End of Session - 09/11/14 1128    Visit Number 8  8/10 G   Number of Visits 17   Date for OT Re-Evaluation 09/29/14   Authorization Type [employer] PPO, Auth required, G-code needed   Authorization Time Period 6 visits approved from 2/23-09/15/14, additional 6 visits approved 08/31/14-10/15/14   Authorization - Visit Number 7   Authorization - Number of Visits 12   OT Start Time 4193   OT Stop Time 1230   OT Time Calculation (min) 42 min   Activity Tolerance Patient tolerated treatment well   Behavior During Therapy [facility] for tasks assessed/performed      Past Medical History  Diagnosis Date  . Aneurysm     s/p clips  . Glaucoma   . Parkinson's disease     Past Surgical History  Procedure Laterality Date  . Craniotomy    . Aneurym clipping      There were no vitals filed for this visit.  Visit Diagnosis:  Bradykinesia  Rigidity  Lack of coordination      Subjective Assessment - 09/11/14 1151    Subjective  "This is painful" (with R hand, meaning difficulty with movement)   Currently in Pain? No/denies                    OT Treatments/Exercises (OP) - 09/11/14 0001    ADLs   UB Dressing Simulated donning/doffing shirt with bag with min cues for big movements.  Simulated clothing adjustment to pull bag into palm with big movements with mod cues for R hand.  Passing bag behind back for simulated donning bra with min cues for big movements.   LB Dressing Simulated donning/doffing pants with bag under foot with min cues for big movements.   Bathing Simulated drying back with bag with min cues for big movements   Fine Motor  Coordination   Grooved pegs with R hand with min difficulty and increased time    Neurological Re-education Exercises   Other Exercises 1 Functional step and reach to each side with big movments/wt. shifts to place clothespins on vertical pole and remove with min cues for big movements and coordinate step and reach.   Functional Reaching Activities   High Level In sitting, functional reaching behind, to the floor, overhead, and across body with set up/min cues for big movements  and incorporating trunk rotation/lateral wt.shift, in-hand manipulation, and sequence for cognitive component.                  OT Short Term Goals - 08/30/14 1109    OT SHORT TERM GOAL #1   Title Pt will be independent with PD-specific HEP.--ck 08/30/14   Baseline progressing with coordination exercises, and PWR! ongoing-08/30/14   Time 4   Period Weeks   Status On-going   OT SHORT TERM GOAL #2   Title Pt will improve coordination/functional reaching for ADLs as shown by improving score on box and blocks test by at least 5 blocks with RUE.--ck 08/30/14   Baseline R-29 blocks initally, (08/30/14- 40 blocks)   Time 4   Period Weeks   Status Achieved   OT SHORT TERM GOAL #3  Title Pt will report increased ease with cutting meat, opening containers, fastening bra.--ck 08/30/14   Baseline Pt reports she fastens bra 50%x,, continued difficulty at times with cutting meat, pt opens most containers-08/30/14   Time 4   Period Weeks   Status On-going   OT SHORT TERM GOAL #4   Title Pt will verbalize understanding of PD-related community resources and ways to prevent future complications--08/30/14   Baseline Pt verbalizes understanding.   Time 4   Period Weeks   Status Achieved           OT Long Term Goals - 08/30/14 1121    OT LONG TERM GOAL #1   Title Pt will verbalize understanding of AE/strategies to increase ease/independence with ADLs/IADLs prn.--ck 09/29/14   Time 8   Period Weeks   Status On-going    OT LONG TERM GOAL #2   Title Pt will improve coordination/functional reaching for ADLs as shown by improving score on box and blocks test by at least 10 blocks with RUE.--ck 09/29/14   Baseline R-29 blocks( met 08/30/14- 40 blocks)   Time 8   Period Weeks   Status Achieved   OT LONG TERM GOAL #3   Title Pt will improve coordination/functional reaching for ADLs as shown by improving score on box and blocks test by at least 5 blocks with LUE.--ck 09/29/14   Baseline L-38 blocks- (met,49 blocks on 08/30/14)   Time 8   Period Weeks   Status Achieved   OT LONG TERM GOAL #4   Title Pt will improve coordination for ADLs as shown by improving time on 9-hole peg test by at least 5 sec with RUE.--ck 09/29/14   Baseline R-37.56sec   Time 8   Period Weeks   Status On-going   OT LONG TERM GOAL #5   Title Pt will improve ability/ease with eating as shown by improving time on PPT#2 by at least 5sec.--ck 09/29/14   Baseline 19.32sec   Time 8   Period Weeks   Status On-going               Plan - 09/11/14 1242    Clinical Impression Statement Pt is progressing towards goals with improved R hand coordination, but continues to need cues for big amplitude movements, particularly with RUE reaching.   Plan big movements for ADLs, simulated cutting food   OT Home Exercise Plan Pt has been issued HEP for:  PWR! hands, PWR! moves in prone and quadraped, coordination HEP   Consulted and Agree with Plan of Care Patient        Problem List Patient Active Problem List   Diagnosis Date Noted  . Depression 11/22/2013  . 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

## 2014-09-11 NOTE — Therapy (Signed)
[facility] [address] [place], [place], [address] Phone: [phone]   Fax:  [phone]  Speech Language Pathology Treatment  Patient Details  Name: Elizabeth Mcintosh MRN: [id number] Date of Birth: 1945/11/25 Referring Provider:  Shon Baton, MD  Encounter Date: 09/11/2014      End of Session - 09/11/14 1113    Visit Number 8   Number of Visits 16   Date for SLP Re-Evaluation 09/29/14   Authorization - Visit Number 7   Authorization - Number of Visits 8   SLP Start Time 1104   SLP Stop Time  1145   SLP Time Calculation (min) 41 min   Activity Tolerance Patient tolerated treatment well      Past Medical History  Diagnosis Date  . Aneurysm     s/p clips  . Glaucoma   . Parkinson's disease     Past Surgical History  Procedure Laterality Date  . Craniotomy    . Aneurym clipping      There were no vitals filed for this visit.  Visit Diagnosis: Hypokinetic Parkinsonian dysphonia - Plan: SLP plan of care cert/re-cert      Subjective Assessment - 09/11/14 1107    Subjective "The ah's today were the best of my "ah life"."               ADULT SLP TREATMENT - 09/11/14 1107    General Information   Behavior/Cognition Alert;Cooperative;Pleasant mood   Treatment Provided   Treatment provided Cognitive-Linquistic   Pain Assessment   Pain Assessment No/denies pain   Cognitive-Linquistic Treatment   Treatment focused on Dysarthria   Skilled Treatment Loud /a/ average 83dB with rare min A for loudness. Stating unscrambled sentences with average 70dB with occasional cues for eliminating vocal fry. Simple sentence tasks completed with occasional min A for loudness/eliminating vocal fry. Pt's success did not improve over the course of the session - remained approximately 80% successful with 70dB with sentence tasks. Conversation (simple) at 70dB was 45% successful.   Assessment / Recommendations / Plan   Plan  Continue with current plan of care   Progression Toward Goals   Progression toward goals Progressing toward goals            SLP Short Term Goals - 09/11/14 1206    SLP SHORT TERM GOAL #1   Title pt will demo 18/20 sentence responses at 69dB over 2 sessions   Time 1   Period Weeks   Status On-going   SLP SHORT TERM GOAL #2   Title pt will engage in 5 minutes simple conversation with average 70dB   Time 1   Period Weeks   Status Revised   SLP SHORT TERM GOAL #3   Title pt will maintain average loud /a/ 83dB over 4 sessions   Time 1   Period Weeks   Status Achieved          SLP Long Term Goals - 09/11/14 1208    SLP LONG TERM GOAL #1   Title pt will demo 8 minutes mod complex conversation with at least average 69dB over 2 sessions   Time 5   Period Weeks   Status On-going   SLP LONG TERM GOAL #2   Title pt will report she feels more confident about her voice quality (less "croaky voice") when talking outside of clinic    Time 5   Period Weeks   Status On-going   SLP LONG TERM GOAL #3  Title pt will maintain loud /a/ at average 83 dB over 6 sessions   Time 5   Period Weeks   Status Revised          Plan - 09/11/14 1210    Clinical Impression Statement Pt reports completing loud /a/ at home over the weekend. Pt has not met 2/3 short term goals, but has  not consistently completed loud /a/ at home until this weekend, despite consistent SLP encouragement to do so. SLP believes this plays into pt's lack of progress thus far.   Potential Considerations Cooperation/participation level        Problem List Patient Active Problem List   Diagnosis Date Noted  . Depression 11/22/2013  . 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

## 2014-09-13 ENCOUNTER — Ambulatory Visit: Payer: Medicare PPO | Admitting: Occupational Therapy

## 2014-09-13 ENCOUNTER — Ambulatory Visit: Payer: Medicare PPO

## 2014-09-13 ENCOUNTER — Ambulatory Visit: Payer: Medicare PPO | Admitting: Physical Therapy

## 2014-09-13 DIAGNOSIS — R293 Abnormal posture: Secondary | ICD-10-CM

## 2014-09-13 DIAGNOSIS — G20 Parkinson's disease: Secondary | ICD-10-CM

## 2014-09-13 DIAGNOSIS — R490 Dysphonia: Secondary | ICD-10-CM

## 2014-09-13 DIAGNOSIS — R29898 Other symptoms and signs involving the musculoskeletal system: Secondary | ICD-10-CM

## 2014-09-13 DIAGNOSIS — R279 Unspecified lack of coordination: Secondary | ICD-10-CM

## 2014-09-13 DIAGNOSIS — R258 Other abnormal involuntary movements: Secondary | ICD-10-CM

## 2014-09-13 NOTE — Patient Instructions (Signed)
You need to make sure to take a good breath with everything you say.

## 2014-09-13 NOTE — Therapy (Signed)
Elizabeth Mcintosh [address] [place], [place], [address] Phone: [phone]   Fax:  [phone]  Occupational Therapy Treatment  Patient Details  Name: ADRIENNE TROMBETTA MRN: [id number] Date of Birth: 06/03/1946 Referring Provider:  Shon Baton, MD  Encounter Date: 09/13/2014      OT End of Session - 09/13/14 1106    Visit Number 9   Number of Visits 17   Date for OT Re-Evaluation 09/29/14   Authorization Type [employer] PPO, Auth required, G-code needed   Authorization Time Period 6 visits approved from 2/23-09/15/14, additional 6 visits approved 08/31/14-10/15/14   Authorization - Visit Number 9   Authorization - Number of Visits 12   OT Start Time 1106   OT Stop Time 1145   OT Time Calculation (min) 39 min   Activity Tolerance Patient tolerated treatment well   Behavior During Therapy [facility] for tasks assessed/performed      Past Medical History  Diagnosis Date  . Aneurysm     s/p clips  . Glaucoma   . Parkinson's disease     Past Surgical History  Procedure Laterality Date  . Craniotomy    . Aneurym clipping      There were no vitals filed for this visit.  Visit Diagnosis:  No diagnosis found.      Subjective Assessment - 09/13/14 1106    Currently in Pain? No/denies                    OT Treatments/Exercises (OP) - 09/13/14 0001    ADLs   Eating Pt practiced feeding techniques and techniques for cutting food with larger movments, min v.c. / demonstration   UB Dressing Practiced donning doffing jacket several times after therapist demonstrated adapted technique, min v.c.  Pt demonstrated increased ease following practice   Home Maintenance Pt was issued handout for large amplitude movments with ADLS/IADLS.  Therapist reviewed with pt.   Neurological Re-education Exercises   Reciprocal Movements Arm bike x 6 mins, for conditioning, min v.c. for speed, pt maintained 30-40 RPM           PWR  ([facility]) - 09/13/14 1320    PWR! exercises Moves in [place]! Up 10   PWR! Rock 10   PWR! Twist 10   PWR! Step 10   Comments min v.c. for PWR!hands and large amplitude movments             OT Education - 09/13/14 1324    Education Details Performing Daily Activities with big movements   Person(s) Educated Patient   Methods Explanation;Demonstration;Handout;Verbal cues   Comprehension Verbalized understanding;Returned demonstration          OT Short Term Goals - 08/30/14 1109    OT SHORT TERM GOAL #1   Title Pt will be independent with PD-specific HEP.--ck 08/30/14   Baseline progressing with coordination exercises, and PWR! ongoing-08/30/14   Time 4   Period Weeks   Status On-going   OT SHORT TERM GOAL #2   Title Pt will improve coordination/functional reaching for ADLs as shown by improving score on box and blocks test by at least 5 blocks with RUE.--ck 08/30/14   Baseline R-29 blocks initally, (08/30/14- 40 blocks)   Time 4   Period Weeks   Status Achieved   OT SHORT TERM GOAL #3   Title Pt will report increased ease with cutting meat, opening containers, fastening bra.--ck 08/30/14   Baseline Pt reports she fastens bra  50%x,, continued difficulty at times with cutting meat, pt opens most containers-08/30/14   Time 4   Period Weeks   Status On-going   OT SHORT TERM GOAL #4   Title Pt will verbalize understanding of PD-related community resources and ways to prevent future complications--08/30/14   Baseline Pt verbalizes understanding.   Time 4   Period Weeks   Status Achieved           OT Long Term Goals - 08/30/14 1121    OT LONG TERM GOAL #1   Title Pt will verbalize understanding of AE/strategies to increase ease/independence with ADLs/IADLs prn.--ck 09/29/14   Time 8   Period Weeks   Status On-going   OT LONG TERM GOAL #2   Title Pt will improve coordination/functional reaching for ADLs as shown by improving score on box and blocks test by at least 10  blocks with RUE.--ck 09/29/14   Baseline R-29 blocks( met 08/30/14- 40 blocks)   Time 8   Period Weeks   Status Achieved   OT LONG TERM GOAL #3   Title Pt will improve coordination/functional reaching for ADLs as shown by improving score on box and blocks test by at least 5 blocks with LUE.--ck 09/29/14   Baseline L-38 blocks- (met,49 blocks on 08/30/14)   Time 8   Period Weeks   Status Achieved   OT LONG TERM GOAL #4   Title Pt will improve coordination for ADLs as shown by improving time on 9-hole peg test by at least 5 sec with RUE.--ck 09/29/14   Baseline R-37.56sec   Time 8   Period Weeks   Status On-going   OT LONG TERM GOAL #5   Title Pt will improve ability/ease with eating as shown by improving time on PPT#2 by at least 5sec.--ck 09/29/14   Baseline 19.32sec   Time 8   Period Weeks   Status On-going               Plan - 09/13/14 1328    Clinical Impression Statement Pt verbalizes understanding of how to incorporate big movments into daily activities.   Plan large amplitude movements   OT Home Exercise Plan Pt has been issued HEP for:  PWR! hands, PWR! moves in prone and quadraped, coordination HEP   Consulted and Agree with Plan of Care Patient        Problem List Patient Active Problem List   Diagnosis Date Noted  . Depression 11/22/2013  . 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 [place] [address] [place], [place], [address] Phone: [phone]   Fax:  [phone]

## 2014-09-13 NOTE — Patient Instructions (Signed)
Provided patient with LSVT-Based instructions of stagger-stance forward/back weightshifting, to be performed with arm swing, x 10 reps in each foot position.  Provided patient with PWR!-Based instructions of lateral weightshifting with leg lifts and reaching, to be performed 10 reps.  Both to be performed once-twice daily.   Had brief discussion regarding frequency/amount/consistency of performance of therapy-based exercises.  Discussed patient making a chart of PT, OT, and speech therapy exercises to make sure that at least several of these exercises are being done daily to avoid being overwhelmed with amount of exercises.

## 2014-09-13 NOTE — Therapy (Signed)
[facility] [address] [place], [place], [address] Phone: [phone]   Fax:  [phone]  Speech Language Pathology Treatment  Patient Details  Name: Elizabeth Mcintosh MRN: [id number] Date of Birth: November 13, 1945 Referring Provider:  Creola Corn, MD  Encounter Date: 09/13/2014      End of Session - 09/13/14 1234    Visit Number 9   Number of Visits 16   Date for SLP Re-Evaluation 09/29/14   Authorization Type humana   Authorization Time Period 2-23 to 09-30-14   Authorization - Visit Number 8   Authorization - Number of Visits 8   SLP Start Time 1149   SLP Stop Time  1230   SLP Time Calculation (min) 41 min   Activity Tolerance Patient tolerated treatment well      Past Medical History  Diagnosis Date  . Aneurysm     s/p clips  . Glaucoma   . Parkinson's disease     Past Surgical History  Procedure Laterality Date  . Craniotomy    . Aneurym clipping      There were no vitals filed for this visit.  Visit Diagnosis: Hypokinetic Parkinsonian dysphonia      Subjective Assessment - 09/13/14 1201    Subjective Pt walked into ST room and engaged in conversation, with dB <69dB average.   Patient is accompained by: --  alone               ADULT SLP TREATMENT - 09/13/14 1203    General Information   Behavior/Cognition Alert;Cooperative;Pleasant mood   Treatment Provided   Treatment provided Cognitive-Linquistic   Pain Assessment   Pain Assessment No/denies pain   Cognitive-Linquistic Treatment   Treatment focused on Dysarthria   Skilled Treatment Loud /a/ average 83dB with mod A for loudness, including example. Pt reports completing twice a day this week. Consistent min-mod A for breath, occasional min A for loudness >69dB with sentence responses.   Assessment / Recommendations / Plan   Plan Continue with current plan of care   Progression Toward Goals   Progression toward goals Not progressing  toward goals (comment)  reduced breath support           SLP Education - 09/13/14 1234    Education Details need for breath support/rationale   Person(s) Educated Patient   Methods Explanation;Demonstration;Verbal cues   Comprehension Verbalized understanding;Verbal cues required          SLP Short Term Goals - 09/13/14 1242    SLP SHORT TERM GOAL #1   Title pt will demo 18/20 sentence responses at 69dB over 2 sessions   Time 1   Period Weeks   Status On-going   SLP SHORT TERM GOAL #2   Title pt will engage in 5 minutes simple conversation with average 70dB   Time 1   Period Weeks   Status Revised   SLP SHORT TERM GOAL #3   Title pt will maintain average loud /a/ 83dB over 4 sessions   Time 1   Period Weeks   Status Achieved          SLP Long Term Goals - 09/13/14 1242    SLP LONG TERM GOAL #1   Title pt will demo 8 minutes mod complex conversation with at least average 69dB over 2 sessions   Time 5   Period Weeks   Status On-going   SLP LONG TERM GOAL #2   Title pt will report she feels more confident  about her voice quality (less "croaky voice") when talking outside of clinic    Time 5   Period Weeks   Status On-going   SLP LONG TERM GOAL #3   Title pt will maintain loud /a/ at average 83 dB over 6 sessions   Time 5   Period Weeks   Status Revised          Plan - 09/13/14 1240    Clinical Impression Statement Pt with varying loud /a/ performance from session to session, has difficulty with sentence tasks 8 sessions into treatment. SLP questions if pt consistently performing loud /a/ at home adequately.   Speech Therapy Frequency 2x / week   Duration --  5 weeks - pending auth from humana   Treatment/Interventions Compensatory techniques;Internal/external aids;SLP instruction and feedback;Functional tasks;Patient/family education   Potential to Achieve Goals Fair   Potential Considerations Cooperation/participation level;Severity of impairments         Problem List Patient Active Problem List   Diagnosis Date Noted  . Depression 11/22/2013  . 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

## 2014-09-13 NOTE — Therapy (Signed)
[facility] [address] [place], [place], [address] Phone: [phone]   Fax:  [phone]  Physical Therapy Treatment  Patient Details  Name: Sherlyn LickMary E Streater MRN: [id number] Date of Birth: 01/04/1946 Referring Provider:  Creola Cornusso, John, MD  Encounter Date: 09/13/2014      PT End of Session - 09/13/14 2043    Visit Number 8   Number of Visits 12  per PT renewal request   Authorization Type [employer] Medicare G-code 10th visit   Authorization Time Period 09/05/14-10/20/14   Authorization - Visit Number 1  [facility] began on 09/13/14   Authorization - Number of Visits 3   PT Start Time 1018   PT Stop Time 1101   PT Time Calculation (min) 43 min   Activity Tolerance Patient tolerated treatment well   Behavior During Therapy [facility] for tasks assessed/performed      Past Medical History  Diagnosis Date  . Aneurysm     s/p clips  . Glaucoma   . Parkinson's disease     Past Surgical History  Procedure Laterality Date  . Craniotomy    . Aneurym clipping      There were no vitals filed for this visit.  Visit Diagnosis:  Bradykinesia  Rigidity      Subjective Assessment - 09/13/14 1020    Subjective Went to family doctor yesterday and he gave me samples of medication to help decrease urination frequency during the night.  It seemed to help.   Currently in Pain? No/denies     Therapeutic Activity: Sit to stand from varied height surfaces:  20" mat surface x 10 reps, then from 16 inch chair x 10 reps, then from 14-16 inch soft/low surface x 10 reps, then 14 inch compliant seated surface x 5 reps, with cues for increased momentum, increased forward lean for improved overall large amplitude, deliberate movement for improved efficiency and ease of transfers.  Neuro RE-education: Standing mini squats x >20 reps with cues for widened BOS.  Pt squats and taps cones on floor with alternating reaches; then  performs squats to pick up objects from floor and place on counter, with cues for large movement patterns.  Standing with wide base of support, pt performs lateral weightshifting with lower extremity lifts and coordinated reaching with supervision and cues for high effort, high intensity.  Stagger stance forward and back weightshifting with coordinated UE reaches with cues for high intensity, high effort.  Side stepping, with coordinated UE movements, with cues to increase foot clearance.  Gait:  Gait activities with use of bilateral walking poles to facilitate reciprocal arm swing, x 350 ft, then gait with no walking poles, with natural, reciprocal arm swing, with therapist's cues for increased arm swing amplitude and increased foot clearance.  Pt appears to have improved overall foot clearance today in therapy compared to previous sessions.  Starting and stopping with gait, with cues to stop with stagger stance for improved weightshifting to initiate gait again.  Backwards gait 4 reps x 10 feet with quick change from forward to backward gait.                       PT Education - 09/13/14 2042    Education provided Yes   Education Details HEP-lateral weightshifting and reaching, stagger stance rock and reach with coordinated arms, high intensity, high effort.   Person(s) Educated Patient   Methods Demonstration;Handout;Verbal cues   Comprehension Verbalized understanding;Returned  demonstration             PT Long Term Goals - 09/11/14 1102    PT LONG TERM GOAL #1   Title Pt will be independent with HEP for improved balance, transfers, and gait. (Target date:  10/05/14-requested renewal and extended POC for 2x/wk additioanl 4 weeks)   Time 4   Period Weeks   Status On-going   PT LONG TERM GOAL #2   Title Pt will improve 5x sit<>stand transfers to less than or equal to 12 seconds for improved transfer safety and efficiency. (Target 10/05/14)   Baseline 09/04/14:  14.84  seconds   Time 4   Period Weeks   Status Revised   PT LONG TERM GOAL #3   Title Pt will improve Functional Gait Assessment to at least 22/30 for decreased fall risk.   Baseline 09/04/14-19/30   Time 4   Period Weeks   Status Revised   PT LONG TERM GOAL #4   Title Pt will verbalize at least 25% improvement in bed mobility and squats for improved functional mobility.   Status Achieved   PT LONG TERM GOAL #5   Title Pt will verbalize understanding of fall prevention within the home environment. Target 10/05/14   Baseline Reviewed on 09/11/14.   Time 4   Period Weeks   Status Achieved   PT LONG TERM GOAL #6   Title Pt will improve four-square step test to less than or equal to 12 seconds with no loss of balance. (Target 10/05/14)   Baseline 09/04/14-13.81 seconds with min guard and narrow BOS   Time 4   Period Weeks   Status New               Plan - 09/13/14 2046    Clinical Impression Statement With cues for large amplitude, high effort and intensity today, pt has improved weightshifting and reaching and improved foot clearance and arm swing with gait.  Insurance authorizaed 3 additional PT visits beginning with today's visit.  Discussed with pt seeing her once per week for the next 2 weeks and she is in agreement to continue to work on functional strength, balance, and gait for improved overall functional mobility.r    Pt will benefit from skilled therapeutic intervention in order to improve on the following deficits Abnormal gait;Decreased balance;Decreased strength;Difficulty walking;Decreased mobility;Postural dysfunction   Rehab Potential Good   PT Frequency 2x / week   PT Duration 6 weeks  will see pt 1x/wk for next 2 weeks due to only 3 visits approved.   PT Treatment/Interventions ADLs/Self Care Home Management;Therapeutic activities;Functional mobility training;Stair training;Gait training;Therapeutic exercise;Balance training;Neuromuscular re-education;Patient/family  education   PT Next Visit Plan gait with large amplitude movements,check HEP for lateral and anterior/posterior weightshifting in stagger-stance position with coordinated reaching   Consulted and Agree with Plan of Care Patient        Problem List Patient Active Problem List   Diagnosis Date Noted  . Depression 11/22/2013  . 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 09/13/2014, 8:51 PM  Lonia Blood, PT 09/13/2014 8:59 PM Phone: [phone] Fax: [phone]   [facility] [address] [place], [place], [address] Phone: [phone]   Fax:  [phone]

## 2014-09-13 NOTE — Patient Instructions (Signed)

## 2014-09-18 ENCOUNTER — Ambulatory Visit: Payer: Medicare PPO | Admitting: Physical Therapy

## 2014-09-18 ENCOUNTER — Encounter: Payer: Self-pay | Admitting: Occupational Therapy

## 2014-09-18 ENCOUNTER — Ambulatory Visit: Payer: Medicare PPO | Admitting: Occupational Therapy

## 2014-09-18 DIAGNOSIS — R2689 Other abnormalities of gait and mobility: Secondary | ICD-10-CM

## 2014-09-18 DIAGNOSIS — R258 Other abnormal involuntary movements: Secondary | ICD-10-CM

## 2014-09-18 DIAGNOSIS — R279 Unspecified lack of coordination: Secondary | ICD-10-CM

## 2014-09-18 DIAGNOSIS — R293 Abnormal posture: Secondary | ICD-10-CM

## 2014-09-18 DIAGNOSIS — R29898 Other symptoms and signs involving the musculoskeletal system: Secondary | ICD-10-CM

## 2014-09-18 DIAGNOSIS — R269 Unspecified abnormalities of gait and mobility: Secondary | ICD-10-CM

## 2014-09-18 NOTE — Therapy (Signed)
[place] [address] [place], [place], [address] Phone: [phone]   Fax:  [phone]  Occupational Therapy Treatment  Patient Details  Name: ODALIS JORDAN MRN: [id number] Date of Birth: [birth year] Referring Provider:  Shon Baton, MD  Encounter Date: 09/18/2014      OT End of Session - 09/18/14 1203    Visit Number 10   Number of Visits 17   Date for OT Re-Evaluation 09/29/14   Authorization Type [employer] PPO, Auth required, G-code needed   Authorization Time Period 6 visits approved from 2/23-09/15/14, additional 6 visits approved 08/31/14-10/15/14   Authorization - Visit Number 10   Authorization - Number of Visits 12   OT Start Time 1105   OT Stop Time 1145   OT Time Calculation (min) 40 min   Activity Tolerance Patient tolerated treatment well   Behavior During Therapy [facility] for tasks assessed/performed      Past Medical History  Diagnosis Date  . Aneurysm     s/p clips  . Glaucoma   . Parkinson's disease     Past Surgical History  Procedure Laterality Date  . Craniotomy    . Aneurym clipping      There were no vitals filed for this visit.  Visit Diagnosis:  Bradykinesia  Rigidity  Lack of coordination  Abnormal posture  Decreased functional mobility      Subjective Assessment - 09/18/14 1201    Subjective  doing ok   Currently in Pain? No/denies                    OT Treatments/Exercises (OP) - 09/18/14 0001    ADLs   Overall ADLs Checked 9-hole peg test, progress with ADLs for g-code.   Eating practicing cutting with big movements with min v.c./demo   Home Maintenance Wiping table with big movements with RUE (vertical and horizontal) with min v.c.   Fine Motor Coordination   Grooved pegs with R hand with min difficulty and increased time, min v.c. for PWR! hand use   Neurological Re-education Exercises   Other Exercises 1 In standing, "writing" alphabet in air using big  movements with mod cues, incorporating wt. shift, functional reaching    Reciprocal Movements Arm bike x 6 mins, for conditioning, min v.c. for speed, pt maintained 30-43 RPM forward and approx 30rpms backwards (target 40-50rpms)   Functional Reaching Activities   High Level In sitting, functional reaching to place small pegs in pegboard to copy design with each hand with set-up/target for big movements and min difficulty/increased time for coordination.                  OT Short Term Goals - 09/18/14 1211    OT SHORT TERM GOAL #1   Title Pt will be independent with PD-specific HEP.--ck 08/30/14   Baseline progressing with coordination exercises, and PWR! ongoing-08/30/14   Time 4   Period Weeks   Status On-going   OT SHORT TERM GOAL #2   Title Pt will improve coordination/functional reaching for ADLs as shown by improving score on box and blocks test by at least 5 blocks with RUE.--ck 08/30/14   Baseline R-29 blocks initally, (08/30/14- 40 blocks)   Time 4   Period Weeks   Status Achieved   OT SHORT TERM GOAL #3   Title Pt will report increased ease with cutting meat, opening containers, fastening bra.--ck 08/30/14   Baseline Pt reports she fastens bra 50%x,, continued difficulty at  times with cutting meat, pt opens most containers-08/30/14   Time 4   Period Weeks   Status On-going  2014-10-14:  fastens bra mostly behind back, but mod I 100% of the time,   OT SHORT TERM GOAL #4   Title Pt will verbalize understanding of PD-related community resources and ways to prevent future complications--08/30/14   Baseline Pt verbalizes understanding.   Time 4   Period Weeks   Status Achieved           OT Long Term Goals - 2014-10-14 1215    OT LONG TERM GOAL #1   Title Pt will verbalize understanding of AE/strategies to increase ease/independence with ADLs/IADLs prn.--ck 09/29/14   Time 8   Period Weeks   Status On-going   OT LONG TERM GOAL #2   Title Pt will improve  coordination/functional reaching for ADLs as shown by improving score on box and blocks test by at least 10 blocks with RUE.--ck 09/29/14   Baseline R-29 blocks( met 08/30/14- 40 blocks)   Time 8   Period Weeks   Status Achieved   OT LONG TERM GOAL #3   Title Pt will improve coordination/functional reaching for ADLs as shown by improving score on box and blocks test by at least 5 blocks with LUE.--ck 09/29/14   Baseline L-38 blocks- (met,49 blocks on 08/30/14)   Time 8   Period Weeks   Status Achieved   OT LONG TERM GOAL #4   Title Pt will improve coordination for ADLs as shown by improving time on 9-hole peg test by at least 5 sec with RUE.--ck 09/29/14   Baseline R-37.56sec   Time 8   Period Weeks   Status On-going  10/14/14:  36.44sec   OT LONG TERM GOAL #5   Title Pt will improve ability/ease with eating as shown by improving time on PPT#2 by at least 5sec.--ck 09/29/14   Baseline 19.32sec   Time 8   Period Weeks   Status On-going               Plan - 2014-10-14 1207    Clinical Impression Statement Pt progressing toward remaining goals   Plan large amplitude movements, coordination   OT Home Exercise Plan Pt has been issued HEP for:  PWR! hands, PWR! moves in prone and quadraped, coordination HEP   Consulted and Agree with Plan of Care Patient          G-Codes - Oct 14, 2014 1204    Functional Assessment Tool Used Box and blocks test:  L-40 (08/30/14), 9-hole peg test:  36.44sec, PPT#2 not re-assessed today   Functional Limitation Carrying, moving and handling objects   Carrying, Moving and Handling Objects Current Status (A3557) At least 20 percent but less than 40 percent impaired, limited or restricted   Carrying, Moving and Handling Objects Goal Status (D2202) At least 1 percent but less than 20 percent impaired, limited or restricted      Problem List Patient Active Problem List   Diagnosis Date Noted  . Depression 11/22/2013  . Akinetic rigid Parkinsons disease  09/20/2013    [facility] 2014/10/14, 12:44 PM  [place] [address] [place], [place], [address] Phone: [phone]   Fax:  [place], OTR/L 10-14-2014 12:44 PM

## 2014-09-18 NOTE — Therapy (Signed)
[facility] [address] [place], [place], [address] Phone: [phone]   Fax:  [phone]  Physical Therapy Treatment  Patient Details  Name: Elizabeth Mcintosh MRN: [id number] Date of Birth: 1945-07-09 Referring Provider:  Creola Corn, MD  Encounter Date: 09/18/2014      PT End of Session - 09/18/14 2135    Visit Number 9   Number of Visits 12  per PT renewal request   Date for PT Re-Evaluation 10/05/14   Authorization Type [employer] Medicare G-code 10th visit   Authorization Time Period 09/05/14-10/20/14   Authorization - Visit Number 2  3 additional [employer] visits started on 09/13/14   Authorization - Number of Visits 3   PT Start Time 1022   PT Stop Time 1102   PT Time Calculation (min) 40 min   Activity Tolerance Patient tolerated treatment well   Behavior During Therapy [facility] for tasks assessed/performed      Past Medical History  Diagnosis Date  . Aneurysm     s/p clips  . Glaucoma   . Parkinson's disease     Past Surgical History  Procedure Laterality Date  . Craniotomy    . Aneurym clipping      There were no vitals filed for this visit.  Visit Diagnosis:  Bradykinesia  Rigidity  Abnormality of gait      Subjective Assessment - 09/18/14 1025    Subjective Didn't sleep as well; no changes, no falls, no pain.  Did my exercises over the weekend   Currently in Pain? No/denies        Neuro Re-education:  Reviewed HEP from last visit-pt return demo understanding, with cues to increase deliberate effort.  Pt also performs shift and lift with cues for high step marching for improved single limb stance, with UE support x 10 reps each position.  Trunk rotation exercises in corner, reaching across body, then diagonal reaches holding ball x 10 reps each, with cues for increased base of support and weightshifting for optimal trunk rotation.  Four square step activity:  Stepping forward, R side, back, L side,  then reverse over canes placed on floor, at least 5 reps, with cues for deliberate step height and width, keeping balance with wide base of support.  Gait activities:  Forward and backward walking, forward marching with coordinated arm swing, then tandem gait, tandem marching, side stepping with coordinated UE movements, all 4-6 reps of 30 ft.  Gait training x 400 ft with cues for deliberate arm swing and increased foot clearance.                              PT Long Term Goals - 09/11/14 1102    PT LONG TERM GOAL #1   Title Pt will be independent with HEP for improved balance, transfers, and gait. (Target date:  10/05/14-requested renewal and extended POC for 2x/wk additioanl 4 weeks)   Time 4   Period Weeks   Status On-going   PT LONG TERM GOAL #2   Title Pt will improve 5x sit<>stand transfers to less than or equal to 12 seconds for improved transfer safety and efficiency. (Target 10/05/14)   Baseline 09/04/14:  14.84 seconds   Time 4   Period Weeks   Status Revised   PT LONG TERM GOAL #3   Title Pt will improve Functional Gait Assessment to at least 22/30 for decreased fall risk.   Baseline 09/04/14-19/30  Time 4   Period Weeks   Status Revised   PT LONG TERM GOAL #4   Title Pt will verbalize at least 25% improvement in bed mobility and squats for improved functional mobility.   Status Achieved   PT LONG TERM GOAL #5   Title Pt will verbalize understanding of fall prevention within the home environment. Target 10/05/14   Baseline Reviewed on 09/11/14.   Time 4   Period Weeks   Status Achieved   PT LONG TERM GOAL #6   Title Pt will improve four-square step test to less than or equal to 12 seconds with no loss of balance. (Target 10/05/14)   Baseline 09/04/14-13.81 seconds with min guard and narrow BOS   Time 4   Period Weeks   Status New               Plan - 09/18/14 2136    Clinical Impression Statement Pt continues to require cueing for maximal  deliberate movements during gait and balance activities today.  Pt continues to have improved gait pattern with less episodes of foot catching on floor during gait.  Plan for checking goals and [facility].   Pt will benefit from skilled therapeutic intervention in order to improve on the following deficits Abnormal gait;Decreased balance;Decreased strength;Difficulty walking;Decreased mobility;Postural dysfunction   Rehab Potential Good   PT Frequency 2x / week   PT Duration 6 weeks  pt will see pt 1x/wk for 1 more week due to only 3 additional visits approved   PT Treatment/Interventions ADLs/Self Care Home Management;Therapeutic activities;Functional mobility training;Stair training;Gait training;Therapeutic exercise;Balance training;Neuromuscular re-education;Patient/family education   PT Next Visit Plan Discuss HEP chart to help patient prioritize exercises upon D/C from PT; check goals and plan for D/C next visit; FOTO, G-CODE   Consulted and Agree with Plan of Care Patient        Problem List Patient Active Problem List   Diagnosis Date Noted  . Depression 11/22/2013  . 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 09/18/2014, 9:42 PM  Lonia BloodAmy Draco Malczewski, PT 09/18/2014 9:43 PM Phone: [phone] Fax: [phone]   [facility] [address] [place], [place], [address] Phone: [phone]   Fax:  [phone]

## 2014-09-22 ENCOUNTER — Encounter: Payer: Medicare PPO | Admitting: Occupational Therapy

## 2014-09-22 ENCOUNTER — Ambulatory Visit: Payer: Medicare PPO

## 2014-09-26 ENCOUNTER — Ambulatory Visit: Payer: Medicare PPO

## 2014-09-26 ENCOUNTER — Ambulatory Visit: Payer: Medicare PPO | Admitting: Occupational Therapy

## 2014-09-26 ENCOUNTER — Ambulatory Visit: Payer: Medicare PPO | Admitting: Physical Therapy

## 2014-09-28 ENCOUNTER — Ambulatory Visit: Payer: Medicare PPO | Admitting: Speech Pathology

## 2014-09-28 ENCOUNTER — Ambulatory Visit: Payer: Medicare PPO | Admitting: Physical Therapy

## 2014-10-03 ENCOUNTER — Encounter: Payer: Medicare PPO | Admitting: Occupational Therapy

## 2014-10-03 ENCOUNTER — Ambulatory Visit: Payer: Medicare PPO | Admitting: Physical Therapy

## 2014-10-04 ENCOUNTER — Ambulatory Visit: Payer: Medicare PPO

## 2014-10-04 ENCOUNTER — Ambulatory Visit: Payer: Medicare PPO | Admitting: Physical Therapy

## 2014-10-04 ENCOUNTER — Ambulatory Visit: Payer: Medicare PPO | Admitting: Occupational Therapy

## 2014-10-04 DIAGNOSIS — R279 Unspecified lack of coordination: Secondary | ICD-10-CM

## 2014-10-04 DIAGNOSIS — R293 Abnormal posture: Secondary | ICD-10-CM | POA: Diagnosis not present

## 2014-10-04 DIAGNOSIS — R490 Dysphonia: Secondary | ICD-10-CM

## 2014-10-04 DIAGNOSIS — R29898 Other symptoms and signs involving the musculoskeletal system: Secondary | ICD-10-CM

## 2014-10-04 DIAGNOSIS — G20 Parkinson's disease: Secondary | ICD-10-CM

## 2014-10-04 DIAGNOSIS — R258 Other abnormal involuntary movements: Secondary | ICD-10-CM

## 2014-10-04 NOTE — Therapy (Signed)
[facility] [address] [facility] [place], [place], [address] Phone: [phone]   Fax:  [phone]  Speech Language Pathology Treatment  Patient Details  Name: Elizabeth Mcintosh MRN: [id number] Date of Birth: 10/18/1945 Referring Provider:  Creola Cornusso, John, MD  Encounter Date: 10/04/2014      End of Session - 10/04/14 1156    Visit Number 10   Number of Visits 16   Date for SLP Re-Evaluation 11/10/14   Authorization Type humana   Authorization Time Period 09-12-14 to 11-12-14   Authorization - Visit Number 9   Authorization - Number of Visits 16   SLP Start Time 1153   SLP Stop Time  1235   SLP Time Calculation (min) 42 min   Activity Tolerance Patient tolerated treatment well      Past Medical History  Diagnosis Date  . Aneurysm     s/p clips  . Glaucoma   . Parkinson's disease     Past Surgical History  Procedure Laterality Date  . Craniotomy    . Aneurym clipping      There were no vitals filed for this visit.  Visit Diagnosis: Hypokinetic Parkinsonian dysphonia      Subjective Assessment - 10/04/14 1155    Subjective Pt with conflict last week and couldn't come to ST.               ADULT SLP TREATMENT - 10/04/14 1157    General Information   Behavior/Cognition Alert;Cooperative;Pleasant mood   Treatment Provided   Treatment provided Cognitive-Linquistic   Pain Assessment   Pain Assessment No/denies pain   Cognitive-Linquistic Treatment   Treatment focused on Dysarthria   Skilled Treatment Pt reports in the last three weeks requests for repeats have decr'd further. Loud /a/ average measured at 85dB. In simple picture description tasks pt remained at or >70dB 90% of the time.    Assessment / Recommendations / Plan   Plan Continue with current plan of care   Progression Toward Goals   Progression toward goals --  Pt is pleased with progress and d/c next session.            SLP Short Term Goals  - 10/04/14 1328    SLP SHORT TERM GOAL #1   Title pt will demo 18/20 sentence responses at 69dB over 2 sessions   Time 4   Period Weeks   Status On-going  renewed for 09-11-14 to 11-10-14   SLP SHORT TERM GOAL #2   Title pt will engage in 5 minutes simple conversation with average 70dB   Time 4   Period Weeks   Status On-going  renewed for 09-11-14 to 11-10-14   SLP SHORT TERM GOAL #3   Title pt will maintain average loud /a/ 83dB over 4 sessions   Time 1   Period Weeks   Status Achieved          SLP Long Term Goals - 10/04/14 1328    SLP LONG TERM GOAL #1   Title pt will demo 8 minutes mod complex conversation with at least average 69dB over 2 sessions   Time 8   Period Weeks   Status On-going   SLP LONG TERM GOAL #2   Title pt will report she feels more confident about her voice quality (less "croaky voice") when talking outside of clinic    Status Achieved   SLP LONG TERM GOAL #3   Title pt will maintain loud /a/ at average 83 dB  over 6 sessions   Time 8   Period Weeks   Status Revised          Plan - 10-20-2014 1326    Clinical Impression Statement Pt with stabilizing loud /a/ at average 85dB today. Consistency of loudness was better with tasks throughout session than 3 weeks ago.   Speech Therapy Frequency 2x / week   Duration 4 weeks   Treatment/Interventions Compensatory techniques;Internal/external aids;SLP instruction and feedback;Functional tasks;Patient/family education   Potential to Achieve Goals Good   Potential Considerations Severity of impairments          G-Codes - 10-20-2014 1330    Functional Assessment Tool Used noms, skilled clinical observation   Functional Limitations Motor speech   Motor Speech Current Status [phone]) At least 20 percent but less than 40 percent impaired, limited or restricted   Motor Speech Goal Status ([id number]) At least 1 percent but less than 20 percent impaired, limited or restricted      Problem List Patient Active Problem  List   Diagnosis Date Noted  . Depression 11/22/2013  . 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

## 2014-10-05 NOTE — Therapy (Signed)
[facility] [address] [place], [place], [address] Phone: [phone]   Fax:  [phone]  Occupational Therapy Treatment  Patient Details  Name: Elizabeth Mcintosh MRN: [id number] Date of Birth: 12-22-1945 Referring Provider:  Shon Baton, MD  Encounter Date: 10/04/2014      OT End of Session - 10/05/14 1239    Visit Number 11   Number of Visits 17   Authorization Type [employer] PPO, Auth required, G-code needed   Authorization Time Period 6 visits approved from 2/23-09/15/14, additional 6 visits approved 08/31/14-10/15/14   Authorization - Visit Number 11   Authorization - Number of Visits 12   OT Start Time 1104   OT Stop Time 1145   OT Time Calculation (min) 41 min   Activity Tolerance Patient tolerated treatment well   Behavior During Therapy [facility] for tasks assessed/performed      Past Medical History  Diagnosis Date  . Aneurysm     s/p clips  . Glaucoma   . Parkinson's disease     Past Surgical History  Procedure Laterality Date  . Craniotomy    . Aneurym clipping      There were no vitals filed for this visit.  Visit Diagnosis:  Lack of coordination  Rigidity  Bradykinesia      Subjective Assessment - 10/04/14 1107    Subjective  Pt reports ankle pain    Currently in Pain? Yes   Pain Score 5    Pain Location Ankle   Pain Descriptors / Indicators Aching   Pain Type Acute pain   Aggravating Factors  night time   Pain Relieving Factors unknown   Multiple Pain Sites No      Treatment: Reviewed fine motor coordination HEP after performing PWR! Hands, pt returned demonstration after v.c. larger movements Handwriting activities with emphasis on larger size and spacing, min v.c., Pt demonstrated improved performance after v.c Education regarding ways to prevent PD- related complications, pt verbalized understanding. Large amplitude movements tossing scarves to target with bilateral UE's, min v.c. for  large amplitude movements and finger extension with RUE.                          OT Short Term Goals - 10/04/14 1120    OT SHORT TERM GOAL #1   Title Pt will be independent with PD-specific HEP.--ck 08/30/14   Baseline progressing with coordination exercises, and PWR! ongoing-08/30/14   Time 4   Period Weeks   Status On-going   OT SHORT TERM GOAL #2   Title Pt will improve coordination/functional reaching for ADLs as shown by improving score on box and blocks test by at least 5 blocks with RUE.--ck 08/30/14   Baseline R-29 blocks initally, (08/30/14- 40 blocks)   Time 4   Period Weeks   Status Achieved   OT SHORT TERM GOAL #3   Title Pt will report increased ease with cutting meat, opening containers, fastening bra.--ck 08/30/14   Baseline Pt reports she fastens bra 50%x,, continued difficulty at times with cutting meat, pt opens most containers-08/30/14   Time 4   Period Weeks   Status On-going  09/18/14:  fastens bra mostly behind back, but mod I 100% of the time,   OT SHORT TERM GOAL #4   Title Pt will verbalize understanding of PD-related community resources and ways to prevent future complications--08/30/14   Baseline Pt verbalizes understanding.   Time 4   Period Weeks  Status Achieved           OT Long Term Goals - 10/04/14 1134    OT LONG TERM GOAL #1   Title Pt will verbalize understanding of AE/strategies to increase ease/independence with ADLs/IADLs prn.--ck 09/29/14   Time 8   Period Weeks   Status On-going   OT LONG TERM GOAL #2   Title Pt will improve coordination/functional reaching for ADLs as shown by improving score on box and blocks test by at least 10 blocks with RUE.--ck 09/29/14   Baseline R-29 blocks( met 08/30/14- 40 blocks)   Time 8   Period Weeks   Status Achieved   OT LONG TERM GOAL #3   Title Pt will improve coordination/functional reaching for ADLs as shown by improving score on box and blocks test by at least 5 blocks with  LUE.--ck 09/29/14   Baseline L-38 blocks- (met,49 blocks on 08/30/14)   Time 8   Period Weeks   Status Achieved   OT LONG TERM GOAL #4   Title Pt will improve coordination for ADLs as shown by improving time on 9-hole peg test by at least 5 sec with RUE.--ck 09/29/14   Baseline R-37.56sec   Time 8   Period Weeks   Status On-going  09/18/14:  36.44sec   OT LONG TERM GOAL #5   Title Pt will improve ability/ease with eating as shown by improving time on PPT#2 by at least 5sec.--ck 09/29/14   Baseline 19.32sec   Time 8   Period Weeks   Status On-going               Plan - 10/05/14 1234    Clinical Impression Statement Pt is progressing towards goals. Pt is being re-certified to cover her 2 remaining visits approved by [facility] as she cancelled appts last week.   Pt will benefit from skilled therapeutic intervention in order to improve on the following deficits (Retired) Decreased balance;Impaired UE functional use;Impaired tone;Decreased strength;Decreased activity tolerance;Decreased endurance;Decreased mobility;Decreased coordination;Decreased knowledge of use of DME   Rehab Potential Good   OT Frequency 2x / week   OT Duration 8 weeks   OT Treatment/Interventions Self-care/ADL training;Therapeutic exercise;Neuromuscular education;Moist Heat;Fluidtherapy;Energy conservation;Therapist, nutritional;Therapeutic exercises;Patient/family education;Balance training;Therapeutic activities;Passive range of motion;Manual Therapy;DME and/or AE instruction;Cryotherapy;Ultrasound;Electrical Stimulation   Plan check remaining goals and d/c, g-code   OT Home Exercise Plan Pt has been issued HEP for:  PWR! hands, PWR! moves in prone and quadraped, coordination HEP   Consulted and Agree with Plan of Care Patient        Problem List Patient Active Problem List   Diagnosis Date Noted  . Depression 11/22/2013  . 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 [place], [place], [address] Phone: [phone]   Fax:  [phone]

## 2014-10-10 ENCOUNTER — Encounter: Payer: Self-pay | Admitting: Occupational Therapy

## 2014-10-10 ENCOUNTER — Encounter: Payer: Medicare PPO | Admitting: Speech Pathology

## 2014-10-10 ENCOUNTER — Ambulatory Visit: Payer: Medicare PPO | Admitting: Occupational Therapy

## 2014-10-10 ENCOUNTER — Ambulatory Visit: Payer: Medicare PPO | Attending: Neurology | Admitting: Physical Therapy

## 2014-10-10 ENCOUNTER — Encounter: Payer: Medicare PPO | Admitting: Occupational Therapy

## 2014-10-10 ENCOUNTER — Ambulatory Visit: Payer: Medicare PPO | Admitting: Physical Therapy

## 2014-10-10 DIAGNOSIS — R258 Other abnormal involuntary movements: Secondary | ICD-10-CM | POA: Diagnosis present

## 2014-10-10 DIAGNOSIS — R29898 Other symptoms and signs involving the musculoskeletal system: Secondary | ICD-10-CM

## 2014-10-10 DIAGNOSIS — R2689 Other abnormalities of gait and mobility: Secondary | ICD-10-CM | POA: Diagnosis present

## 2014-10-10 DIAGNOSIS — R490 Dysphonia: Secondary | ICD-10-CM | POA: Diagnosis present

## 2014-10-10 DIAGNOSIS — R293 Abnormal posture: Secondary | ICD-10-CM | POA: Insufficient documentation

## 2014-10-10 DIAGNOSIS — R279 Unspecified lack of coordination: Secondary | ICD-10-CM | POA: Insufficient documentation

## 2014-10-10 DIAGNOSIS — R269 Unspecified abnormalities of gait and mobility: Secondary | ICD-10-CM | POA: Insufficient documentation

## 2014-10-10 DIAGNOSIS — G20 Parkinson's disease: Secondary | ICD-10-CM

## 2014-10-10 NOTE — Patient Instructions (Signed)
[employer]/Continued exercise program  *Exercises from therapy-daily if able  Can do PWR! Quadriped & PWR! Standing on Mondays, Wednesdays and Fridays Can do PWR! Sitting, PWR! Supine and PWR! Prone Tuesdays, Thursday, Saturdays Do PWR! Hands daily! Do Hand coordination every other day.  Remember you can do these while watching tv in between commercials.   *Cardio-3x/week for 30 minutes total-treadmill, recumbent bike, elliptical   *Walking program-5x/week for 30 minutes-working on arm swing, intensity and best posture.  Remember "heel strike"-letting heels hit the ground first.

## 2014-10-10 NOTE — Therapy (Signed)
[facility] [address] [place], [place], [address] Phone: [phone]   Fax:  [phone]  Physical Therapy Treatment  Patient Details  Name: Elizabeth Mcintosh MRN: [id number] Date of Birth: 10/18/1945 Referring Provider:  Shon Baton, MD  Encounter Date: 10/10/2014      PT End of Session - 10/10/14 1805    Visit Number 10   Number of Visits 12   Date for PT Re-Evaluation 10/05/14   Authorization Type [employer] G-code 10th visit   Authorization Time Period 09/05/14-10/20/14   PT Start Time 1403   PT Stop Time 1447   PT Time Calculation (min) 44 min   Activity Tolerance Patient tolerated treatment well   Behavior During Therapy [facility] for tasks assessed/performed      Past Medical History  Diagnosis Date  . Aneurysm     s/p clips  . Glaucoma   . Parkinson's disease     Past Surgical History  Procedure Laterality Date  . Craniotomy    . Aneurym clipping      There were no vitals filed for this visit.  Visit Diagnosis:  Decreased functional mobility  Parkinson's disease          [facility] PT Assessment - 10/10/14 1418    Standardized Balance Assessment   Standardized Balance Assessment Four Square Step Test   Four Square Step Test Trial One  10.6 seconds   Functional Gait  Assessment   Gait assessed  Yes   Gait Level Surface Walks 20 ft in less than 7 sec but greater than 5.5 sec, uses assistive device, slower speed, mild gait deviations, or deviates 6-10 in outside of the 12 in walkway width.  6.03 seconds   Change in Gait Speed Able to smoothly change walking speed without loss of balance or gait deviation. Deviate no more than 6 in outside of the 12 in walkway width.   Gait with Horizontal Head Turns Performs head turns smoothly with no change in gait. Deviates no more than 6 in outside 12 in walkway width   Gait with Vertical Head Turns Performs head turns with no change in gait. Deviates no more  than 6 in outside 12 in walkway width.   Gait and Pivot Turn Pivot turns safely within 3 sec and stops quickly with no loss of balance.   Step Over Obstacle Is able to step over one shoe box (4.5 in total height) without changing gait speed. No evidence of imbalance.   Gait with Narrow Base of Support Ambulates less than 4 steps heel to toe or cannot perform without assistance.   Gait with Eyes Closed Walks 20 ft, uses assistive device, slower speed, mild gait deviations, deviates 6-10 in outside 12 in walkway width. Ambulates 20 ft in less than 9 sec but greater than 7 sec.   Ambulating Backwards Walks 20 ft, uses assistive device, slower speed, mild gait deviations, deviates 6-10 in outside 12 in walkway width.   Steps Alternating feet, no rail.   Total Score 23                     [facility] Adult PT Treatment/Exercise - 10/10/14 1418    Transfers   Sit to Stand Five times sit to stand   Sit to Stand Details (indicate cue type and reason) 14.6 seconds    Timed Up and Go Test   TUG Normal TUG   Normal TUG (seconds) 7.87  PWR [facility]) - 2014/10/22 1602    PWR! exercises Moves in quadraped;Hands  modified quadraped   PWR! [address] [place]   PWR! Twist x10   Comments with min v.c.   PWR! Up x5 in various positions (in front, out to side, overhead) with min v.c. for big movements             PT Education - 2014-10-22 1802    Education provided Yes   Education Details [employer], re-evaluation in 6 months, progress toward goals   Person(s) Educated Patient   Methods Explanation;Handout   Comprehension Verbalized understanding             PT Long Term Goals - 10-22-14 1806    PT LONG TERM GOAL #1   Title Pt will be independent with HEP for improved balance, transfers, and gait. (Target date:  10/05/14-requested renewal and extended POC for 2x/wk additioanl 4 weeks)   Time 4   Period Weeks   Status Achieved   PT LONG TERM GOAL #2   Title Pt will  improve 5x sit<>stand transfers to less than or equal to 12 seconds for improved transfer safety and efficiency. (Target 10/05/14)   Baseline 09/04/14:  14.84 seconds;14.6 seconds 2014/10/22   Time 4   Period Weeks   Status Achieved   PT LONG TERM GOAL #3   Title Pt will improve Functional Gait Assessment to at least 22/30 for decreased fall risk.   Baseline 09/04/14-19/30;23/30 Oct 22, 2014   Time 4   Period Weeks   Status Achieved   PT LONG TERM GOAL #4   Title Pt will verbalize at least 25% improvement in bed mobility and squats for improved functional mobility.   Status Achieved   PT LONG TERM GOAL #5   Title Pt will verbalize understanding of fall prevention within the home environment. Target 10/05/14   Baseline Reviewed on 09/11/14.   Time 4   Period Weeks   Status Achieved   PT LONG TERM GOAL #6   Title Pt will improve four-square step test to less than or equal to 12 seconds with no loss of balance. (Target 10/05/14)   Baseline 09/04/14-13.81 seconds with min guard and narrow BOS;10.6 seconds 10-22-14   Time 4   Period Weeks   Status Achieved               Plan - Oct 22, 2014 1808    Clinical Impression Statement Pt met all goals.  Discharge per Mady Haagensen, PT.  Pt to have evaluation again in 6 months.  Pt states no further questions and agrees to d/c.   PT Next Visit Plan Discharge per Mady Haagensen, PT.   Consulted and Agree with Plan of Care Patient          G-Codes - 22-Oct-2014 1809    Functional Assessment Tool Used 5x sit<>stand 14.6 seconds;Functional Gait Assessment 23/30;TUG 7.87 seconds      Problem List Patient Active Problem List   Diagnosis Date Noted  . Depression 11/22/2013  . 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 [place], [place], [address] Phone: [phone]   Fax:  [place],  [place] 2014/10/22 6:13 PM Phone: [phone] Fax: [phone]

## 2014-10-10 NOTE — Therapy (Signed)
[facility] [address] [place], [place], [address] Phone: [phone]   Fax:  [phone]  Occupational Therapy Treatment  Patient Details  Name: Elizabeth Mcintosh MRN: [id number] Date of Birth: Dec 26, 1945 Referring Provider:  Shon Baton, MD  Encounter Date: 10/10/2014      OT End of Session - 10/10/14 1323    Visit Number 12   Number of Visits 17   Authorization Type [employer] PPO, Auth required, G-code needed   Authorization Time Period 6 visits approved from 2/23-09/15/14, additional 6 visits approved 08/31/14-10/15/14   Authorization - Visit Number 12   Authorization - Number of Visits 12   OT Start Time 1319   OT Stop Time 1400   OT Time Calculation (min) 41 min   Activity Tolerance Patient tolerated treatment well   Behavior During Therapy [facility] for tasks assessed/performed      Past Medical History  Diagnosis Date  . Aneurysm     s/p clips  . Glaucoma   . Parkinson's disease     Past Surgical History  Procedure Laterality Date  . Craniotomy    . Aneurym clipping      There were no vitals filed for this visit.  Visit Diagnosis:  Bradykinesia  Rigidity  Lack of coordination  Abnormal posture  Decreased functional mobility      Subjective Assessment - 10/10/14 1322    Subjective  Pt report R ankle swollen mildly.   Currently in Pain? No/denies                      OT Treatments/Exercises (OP) - 10/10/14 0001    ADLs   Overall ADLs checked remaining goals and discussed progress and recommendation for screen in approx 6-7 months.  Issued foam grip for pen to assist with writing.   Neurological Re-education Exercises   Other Exercises 1 Standing at wall AAROM along wall for shoulder flex and abduction and lateral reach with side step with min-mod v.c. for big amplitude movements.   Other Grasp and Release Exercises  picking up small objects with min v.c. for big movements, PWR hands/step  with grasp/release           PWR [facility]) - 10/10/14 1602    PWR! exercises  modified quadraped   PWR! [address] [place]   PWR! Twist x10   Comments with min v.c.   PWR! Hands, PWR! Up x5 in various positions (in front, out to side, overhead) with min v.c. for big movements             OT Education - 10/10/14 1600    Education Details ways to prevent future complications related to PD (RUE, trunk rotation, posture, etc.), proper positioning of RUE (hx of impingement), using lighter wts and incr reps at gym with big amplitudes (focus more on UE extension), review use of big movements and importance for ADLs   Person(s) Educated Patient   Methods Explanation;Demonstration   Comprehension Verbalized understanding          OT Short Term Goals - 10/10/14 1325    OT SHORT TERM GOAL #1   Title Pt will be independent with PD-specific HEP.--ck 08/30/14   Baseline progressing with coordination exercises, and PWR! ongoing-08/30/14   Time 4   Period Weeks   Status Achieved   OT SHORT TERM GOAL #2   Title Pt will improve coordination/functional reaching for ADLs as shown by improving score on box and blocks test by at  least 5 blocks with RUE.--ck 08/30/14   Baseline R-29 blocks initally, (08/30/14- 40 blocks)   Time 4   Period Weeks   Status Achieved   OT SHORT TERM GOAL #3   Title Pt will report increased ease with cutting meat, opening containers, fastening bra.--ck 08/30/14   Baseline Pt reports she fastens bra 50%x,, continued difficulty at times with cutting meat, pt opens most containers-08/30/14   Time 4   Period Weeks   Status Achieved  09/18/14:  fastens bra mostly behind back, but mod I 100% of the time, Oct 17, 2014:  met   OT SHORT TERM GOAL #4   Title Pt will verbalize understanding of PD-related community resources and ways to prevent future complications--08/30/14   Baseline Pt verbalizes understanding.   Time 4   Period Weeks   Status Achieved           OT Long Term Goals -  2014/10/17 1325    OT LONG TERM GOAL #1   Title Pt will verbalize understanding of AE/strategies to increase ease/independence with ADLs/IADLs prn.--ck 09/29/14   Time 8   Period Weeks   Status Achieved   OT LONG TERM GOAL #2   Title Pt will improve coordination/functional reaching for ADLs as shown by improving score on box and blocks test by at least 10 blocks with RUE.--ck 09/29/14   Baseline R-29 blocks( met 08/30/14- 40 blocks)   Time 8   Period Weeks   Status Achieved   OT LONG TERM GOAL #3   Title Pt will improve coordination/functional reaching for ADLs as shown by improving score on box and blocks test by at least 5 blocks with LUE.--ck 09/29/14   Baseline L-38 blocks- (met,49 blocks on 08/30/14)   Time 8   Period Weeks   Status Achieved   OT LONG TERM GOAL #4   Title Pt will improve coordination for ADLs as shown by improving time on 9-hole peg test by at least 5 sec with RUE.--ck 09/29/14   Baseline R-37.56sec   Time 8   Period Weeks   Status Not Met  09/18/14:  36.44sec, 2014/10/17:  43.44sec, 40.0sec   OT LONG TERM GOAL #5   Title Pt will improve ability/ease with eating as shown by improving time on PPT#2 by at least 5sec.--ck 09/29/14   Baseline 19.32sec   Time 8   Period Weeks   Status Not Met  19.07sec               Plan - 10-17-14 1607    Clinical Impression Statement Pt met all STGs and 3/5 LTGs.  Pt verbalizes increased awareness of ways to prevent future complications and strategies for ADLs.   Plan d/c, g-code completed   OT Home Exercise Plan Pt has been issued HEP for:  PWR! hands, PWR! moves in prone and quadraped, coordination HEP   Consulted and Agree with Plan of Care Patient          G-Codes - 10/17/2014 1608    Functional Assessment Tool Used Box and blocks test:  L-40 (08/30/14), 9-hole peg test:  40.0 sec, PPT#2 19.07   Functional Limitation Carrying, moving and handling objects   Carrying, Moving and Handling Objects Goal Status (Q3300) At least  1 percent but less than 20 percent impaired, limited or restricted   Carrying, Moving and Handling Objects Discharge Status [phone]) At least 20 percent but less than 40 percent impaired, limited or restricted       [place]  Remaining deficits: Bradykinesia, rigidity, abnormal posture, decreased coordination, decreased functional mobility    Education / Equipment: Pt instructed in ways to prevent PD-related future complications, strategies for ADLs, use of big amplitude movements, updated HEP.  Pt verbalized understanding of all education provided.  Plan: Patient agrees to discharge.  Patient goals were partially met. Patient is being discharged due to                                                    and reaching maximal rehab potential at this time.  ?????All STGs and 3/5 LTGs met.  Pt would benefit from re-evaluation/occupational therapy screen in approx 6-7 months to assess for need for further therapy/functional changes due to progressive nature of diagnosis.       Problem List Patient Active Problem List   Diagnosis Date Noted  . Depression 11/22/2013  . 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 10/10/2014, 4:17 PM  [facility] [address] [place], [place], [address] Phone: [phone]   Fax:  [place], OTR/L 10/10/2014 4:17 PM

## 2014-10-12 ENCOUNTER — Encounter: Payer: Medicare PPO | Admitting: Speech Pathology

## 2014-10-12 ENCOUNTER — Ambulatory Visit: Payer: Medicare PPO | Admitting: Physical Therapy

## 2014-10-12 ENCOUNTER — Encounter: Payer: Medicare PPO | Admitting: Occupational Therapy

## 2014-10-17 ENCOUNTER — Ambulatory Visit: Payer: Medicare PPO

## 2014-10-17 ENCOUNTER — Ambulatory Visit: Payer: Medicare PPO | Admitting: Physical Therapy

## 2014-10-17 ENCOUNTER — Encounter: Payer: Medicare PPO | Admitting: Occupational Therapy

## 2014-10-19 ENCOUNTER — Ambulatory Visit: Payer: Medicare PPO

## 2014-10-24 ENCOUNTER — Encounter: Payer: Medicare PPO | Admitting: Occupational Therapy

## 2014-10-24 ENCOUNTER — Ambulatory Visit: Payer: Medicare PPO | Admitting: Physical Therapy

## 2014-10-24 ENCOUNTER — Encounter: Payer: Medicare PPO | Admitting: Speech Pathology

## 2014-10-26 ENCOUNTER — Ambulatory Visit (INDEPENDENT_AMBULATORY_CARE_PROVIDER_SITE_OTHER): Payer: Medicare PPO | Admitting: Neurology

## 2014-10-26 ENCOUNTER — Encounter: Payer: Self-pay | Admitting: Neurology

## 2014-10-26 VITALS — BP 98/62 | HR 72 | Ht 68.5 in | Wt 159.0 lb

## 2014-10-26 DIAGNOSIS — G20 Parkinson's disease: Secondary | ICD-10-CM

## 2014-10-26 DIAGNOSIS — F1010 Alcohol abuse, uncomplicated: Secondary | ICD-10-CM | POA: Diagnosis not present

## 2014-10-26 MED ORDER — CARBIDOPA-LEVODOPA ER 50-200 MG PO TBCR: 1.0000 | EXTENDED_RELEASE_TABLET | Freq: Every day | ORAL | Status: DC

## 2014-10-26 MED ORDER — PRAMIPEXOLE DIHYDROCHLORIDE 0.5 MG PO TABS: 0.5000 mg | ORAL_TABLET | Freq: Three times a day (TID) | ORAL | Status: DC

## 2014-10-26 NOTE — Progress Notes (Signed)
Elizabeth Mcintosh was seen today in the movement disorders clinic for neurologic consultation at the request of [name],[name], MD.  The consultation is for the evaluation of abnormal gait and "awkwardness" and weakness on the R side.  Pt states that they were away in [place] and [place] in October, 2014 and she felt like she just didn't bounce back after that.  She just felt "slow" after.  In Jan, she went to ortho to get an injection in her shoulder b/c of an impingement syndrome but still felt slow after PT, although she did good in PT.  She tried to d/c all the meds that she thought that she thought would interfere and could cause slowness - benadryl and unisom.  She feels that her right arm doesn't swing.  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  11/22/13 update:  Pt returns today for f/u, accompanied by her daughter who supplements the history.  The patient was diagnosed with Parkinson's disease last visit.  We started levodopa.  The patient reports that she is moving easier.  Her friends noticed that she is more alert.  She did her physical therapies and is planning on starting a cardiovascular exercise program, but has not figured out how exactly to do that or what to do.  She and her daughter bring a long list of questions.  Her daughter asks me about recognition of depression.  The patient denies this, but her daughter states that she does not think that the patient would be able to recognize it, and her daughter thinks that there is some depression.  The patient is very active, especially in regards to travel.  She will be traveling to [place] in July and then will be traveling again overseas in October.  She asks me about refilling her Ambien for insomnia.  Her daughter asks me about drinking alcohol.  Last visit, the patient reported that she drank about 3 or 4 glasses of wine per night, each totaling 6-8 ounces.  No falls.  No hallucinations.  No lightheadedness.    02/22/14  update:  Pt accompanied by her son, who supplements the history.  Pt saw Dr. Conley Canal since last visit.  No evidence of dementia.  Believed had adjustment d/o related to dx of PD and pt agreed to go to counseling with Dr. Conley Canal.  Dr. Conley Canal also discussed with her tapering alcohol use, as I have also.  Pt states that she was told that she could still have 3 drinks per night.   She has increased her carbidopa/levodopa 25/100 to 2 in the AM, 1 in the afternoon, 1 in the evening.  Pt states that she has more energy after going up in the AM.  Her BP was really low today, and she states that it "is always low."  She is sometimes dizzy but it is not bad.  She is getting up in the middle of the night frequently to use the bathroom and cannot sleep.  She wonders if the klonopin contributes to that.  She is exercising more; went 5 days last week.    06/27/14 update:  The patient is following up today.  She is accompanied by her husband who supplements the history.  She is currently on carbidopa/levodopa 25/100, 2 tablets in the morning, one in the afternoon and one in the evening.  Pramipexole was started last visit and worked up to 0.5 g 3 times a day.  She stopped  this and ended up restarting it in November.  She has not noticed a huge difference with this medication.  She did call me in November with an episode of vision change where everything went bright.  It lasted for about 7-10 minutes and she felt somewhat dizzy.  She is not able to have an MRI because of prior aneurysm clips.  A CT of the brain was done and did not reveal any changes.  A carotid ultrasound demonstrated less than 50% stenosis bilaterally.  She was just evaluated for PT/OT/ST on 06/08/2014.  She has been doing a lot of travel - [place], [place] and she did well with a lot of walking.  Her husband states that she did pretty well.  She is working out with a Physiological scientist with at [employer].  No falls but feels that she is "lurching"  forward.  Mood is good.  Trouble sleeping.  States that Dr. Virgina Jock increased her klonopin to 1.5 tablets at night.  Still drinking 2 glasses wine per night but that is down from previously.  10/26/14 update:  The patient is following up today, accompanied by her daughter who supplements the history.  She is on carbidopa/levodopa 25/100, 2 in the morning, one in the afternoon and one in the evening along with pramipexole, 0.5 mg 3 times per day.  She states that she misses one lunchtime pill a week because she forgets. Pt states that she fell 2 days ago.   She was quickly walking into the kitchen and fell into the drying rack in front of the washer and she fell into her shoulder.  No LOC.  Her husband helped her get back up.  Her daughter thinks that she had another fall not long after our last visit; she stepped outside on the ice and went out to get the paper and fell.  She has just finished PT/OT/ST and she states that she learned strategies and she learned exercises.  She has a Physiological scientist at the [facility] that she sees once per week and she goes 3 other times throughout the week.  She is on a combination of clonazepam and 3 mg of melatonin for insomnia.  She states that is working well.  She states that her PCP started myrbetriq since last visit and it has helped her bladder.  She does state that she is having cramping of the feet and legs about 3 nights a week.  She continues to drink a glass of wine at lunchtime and 1 "small" glass of liquor in the evening.  She has developed some diplopia and is getting new glasses.  She states that she will not have prisms in the glasses.   Neuroimaging has  previously been performed.  It is available for my review today.  CT of the brain was done on 07/30/13 demonstrating encephalomalacia in the R frontal region and prior aneurysm clips.   Pt reports that the aneursym happened when she was pregnant and it ruptured.  PREVIOUS MEDICATIONS: none to date  ALLERGIES:     Allergies  Allergen Reactions  . Penicillins     *childhood allergy*    CURRENT MEDICATIONS:  Current Outpatient Prescriptions on File Prior to Visit  Medication Sig Dispense Refill  . bimatoprost (LUMIGAN) 0.01 % SOLN Place 1 drop into both eyes at bedtime.    . carbidopa-levodopa (SINEMET IR) 25-100 MG per tablet TAKE 2 TABLETS IN THE AM, 1 TABLET AT NOON, 1 IN THE EVENING 120 tablet 5  .  clonazePAM (KLONOPIN) 0.5 MG tablet Take 1 tablet (0.5 mg total) by mouth at bedtime. 30 tablet 5  . glucosamine-chondroitin 500-400 MG tablet Take 1 tablet by mouth daily.    Marland Kitchen LYSINE PO Take by mouth.    . timolol (TIMOPTIC) 0.5 % ophthalmic solution Place 1 drop into both eyes every morning.    . vitamin B-12 (CYANOCOBALAMIN) 1000 MCG tablet Take 1,000 mcg by mouth daily.     No current facility-administered medications on file prior to visit.    PAST MEDICAL HISTORY:   Past Medical History  Diagnosis Date  . Aneurysm     s/p clips  . Glaucoma   . Parkinson's disease     PAST SURGICAL HISTORY:   Past Surgical History  Procedure Laterality Date  . Craniotomy    . Aneurym clipping      SOCIAL HISTORY:   History   Social History  . Marital Status: Married    Spouse Name: N/A  . Number of Children: N/A  . Years of Education: N/A   Occupational History  . retired     Scientist, water quality, Fish farm manager   Social History Main Topics  . Smoking status: Former Research scientist (life sciences)  . Smokeless tobacco: Not on file     Comment: quit 40 years ago  . Alcohol Use: 0.0 oz/week    0 Standard drinks or equivalent per week     Comment: 1-2 glasses wine/day (6-8 oz in each glass)  . Drug Use: No  . Sexual Activity: Yes   Other Topics Concern  . Not on file   Social History Narrative    FAMILY HISTORY:   Family Status  Relation Status Death Age  . Mother Deceased 46    fall  . Father Deceased 85    CAD, renal failure  . Brother Deceased     CVA, MI after hip replacement  . Brother Alive      bladder CA  . Child Alive     healthy    ROS:  A complete 10 system review of systems was obtained and was unremarkable apart from what is mentioned above.  PHYSICAL EXAMINATION:    VITALS:   Filed Vitals:   10/26/14 1110  BP: 98/62  Pulse: 72  Height: 5' 8.5" (1.74 m)  Weight: 159 lb (72.122 kg)    GEN:  The patient appears stated age and is in NAD. HEENT:  Normocephalic, atraumatic.  The mucous membranes are moist. The superficial temporal arteries are without ropiness or tenderness. CV:  RRR Lungs:  CTAB.  She does have some inspiratory gasps. Neck/HEME:  There are no carotid bruits bilaterally.  Neurological examination:  Orientation: The patient is alert and oriented x3. Fund of knowledge is appropriate.  Recent and remote memory are intact.  Attention and concentration are normal.    Able to name objects and repeat phrases. Cranial nerves: There is good facial symmetry.   Extraocular muscles are intact.  There are no square wave jerks.  The visual fields are full to confrontational testing. The speech is fluent and clear. Soft palate rises symmetrically and there is no tongue deviation. Hearing is intact to conversational tone. Sensation: Sensation is intact to light and pinprick throughout (facial, trunk, extremities). Vibration is intact at the bilateral big toe. There is no extinction with double simultaneous stimulation. There is no sensory dermatomal level identified.   Motor: Strength is 5/5 in the bilateral upper and lower extremities.   Shoulder shrug is equal and symmetric.  There is no pronator drift.   Movement examination: Tone: There is mild increased tone in the RUE.  Tone in the left upper extremity is normal today l.  Tone in the bilateral lower extremities is normal. Abnormal movements: There is no tremor, even with distraction techniques. Coordination:  There is decremation with RAM's on the right but it is markedly improved Gait and Station: The patient  has no significant difficulty arising out of a deep-seated chair without the use of the hands. The patient's stride length is normal today.  She has decreased arm swing on the right.  I asked her to run down the hall and she is able to do that.  When she does get back into the room, she does trip on the right leg but she is able to catch herself.  The patient has a negative pull test.       ASSESSMENT/PLAN:  1.  Parkinsonism , diagnosed 09/20/2013 with symptoms since October, 2014.  - Continue carbidopa/levodopa 25/100 2 tablets in the morning, with one in the afternoon and one in the evening.    -Much improved after mirapex  0.5 mg tid.  Risks, benefits, side effects and alternative therapies were discussed.  The opportunity to ask questions was given and they were answered to the best of my ability.  The patient expressed understanding and willingness to follow the outlined treatment protocols.  She denies any compulsive behaviors.  -I had quite a long discussion with the patient and her daughter.  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  Based on the development of diplopia, incomplete response to levodopa, and inspiratory gasps I am leaning now toward MSA.  I did tell her that it may take a little more time for [place] to tell.  -add carbidopa/levodopa 50/200 for toe cramping/leg cramping at night.    -continue exercise  -I stressed again that I really want her to decrease her alcohol intake and would like her to discontinue it altogether.  -talked about aware in care kit.  Much greater than 50% of this visit was spent in counseling with the patient and the family.  Total face to face time:  60 min 2.  Adjustment d/o  -doing much better.  States that she has Dr. Conley Canal if she needs her but hasn't needed her. 3.  Urinary frequency  -saw gyn.  Doing better on myrbetriq 4.  Sleep difficulty  -on klonopin and melatonin,  3 mg, and doing better.   Talked about role that EtOH can play in disrupting sleep.   5.  Return in about 4 months (around 02/26/2015).

## 2014-10-26 NOTE — Patient Instructions (Signed)
1. Start Carbidopa Levodopa 50/200 at night. Prescription sent to your pharmacy.  2. Decrease alcohol intake.

## 2014-10-30 ENCOUNTER — Telehealth: Payer: Self-pay | Admitting: *Deleted

## 2014-10-30 DIAGNOSIS — H539 Unspecified visual disturbance: Secondary | ICD-10-CM

## 2014-10-30 DIAGNOSIS — Z8679 Personal history of other diseases of the circulatory system: Secondary | ICD-10-CM

## 2014-10-30 NOTE — Telephone Encounter (Addendum)
Spoke with patient and she states she had one episode on Saturday where she had slow movements. No other episodes of this. She has had two episodes of vision change in the last two weeks. They usually only last a few minutes, but one episode lasted 15 minutes. She had the same thing several years ago, was seen by an ophthalmologist and told it was "in her brain". She describes this as "fragmented" vision where everything looks like a prism of light. No vision loss, blurry vision or double vision. Patient made aware I would let Dr Tat know and call back if anything needs done. She will call as needed.

## 2014-10-30 NOTE — Telephone Encounter (Signed)
Patient is having vision change twice and trouble moving (moving in slow motions) patient would just like to make you aware of this because it was different Call back number [phone]

## 2014-10-30 NOTE — Telephone Encounter (Signed)
What is vision change?  Does that mean double vision, loss of vision, blurry vision?

## 2014-10-30 NOTE — Telephone Encounter (Signed)
Looking back, I see she called in nov about similar issue and we did carotid u/s and CT brain and they were okay.  Could be aura without headache.  I know she can't have MRI.  Let's do CTA neck/brain just to make sure that we aren't missing anything.

## 2014-10-30 NOTE — Telephone Encounter (Signed)
FYI

## 2014-10-30 NOTE — Telephone Encounter (Signed)
Left message on machine for patient to call back.

## 2014-10-31 ENCOUNTER — Other Ambulatory Visit: Payer: Self-pay | Admitting: Neurology

## 2014-10-31 NOTE — Telephone Encounter (Signed)
Left message on machine for patient to call back.

## 2014-10-31 NOTE — Telephone Encounter (Signed)
Spoke with patient and made her aware she needs an CTA head/neck. Appt scheduled for 11/09/2014 to arrive at 9:00 am for labs, patient aware.

## 2014-10-31 NOTE — Therapy (Signed)
[facility] [address] [place], [place], [address] Phone: [phone]   Fax:  [phone]  Patient Details  Name: Elizabeth Mcintosh MRN: [id number] Date of Birth: Nov 23, 1945 Referring Provider:  No ref. provider found  Encounter Date: 10/31/2014  SPEECH THERAPY DISCHARGE SUMMARY  Visits from Start of Care: 10  Current functional level related to goals / functional outcomes: Pt partially met her LTGs (final tally unknown due to pt discharge prior to last scheduled visit). She was predominantly pleased with progress after 9 therapy visits and agreed upon discharge after missing approx 2 weeks of therapy due to pt cancellations, and scheduling conflicts. SLP phoned pt and she stated it was fine to discharge at this time without a final visit, and to see her next at the scheduled screen. Pt reported people were asking her to repeat less frequently than prior to initiation of therapy.   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   Education / Equipment: Loud /a/, need to incr effort when speaking to improve intelligibility.  Plan: Patient agrees to discharge.  Patient goals were not met. Patient is being discharged due to being pleased with the current functional level.  ?????      [place] , MS, CCC-SLP 10/31/2014, 1:52 PM  [facility] [address] [place], [place], [address] Phone: [phone]   Fax:  [phone]

## 2014-10-31 NOTE — Telephone Encounter (Signed)
Mirapex refill requested. Per last office note- patient to remain on medication. Refill approved and sent to patient's pharmacy.   

## 2014-10-31 NOTE — Addendum Note (Signed)
Addended bySilvio Pate: Annete Ayuso L on: 10/31/2014 03:04 PM   Modules accepted: Orders

## 2014-11-01 ENCOUNTER — Other Ambulatory Visit: Payer: Self-pay | Admitting: Neurology

## 2014-11-01 NOTE — Telephone Encounter (Signed)
Mirapex refill requested and denied. RX filled yesterday.

## 2014-11-07 ENCOUNTER — Telehealth: Payer: Self-pay | Admitting: Neurology

## 2014-11-07 DIAGNOSIS — R55 Syncope and collapse: Secondary | ICD-10-CM

## 2014-11-07 NOTE — Telephone Encounter (Signed)
Spoke with patient and made aware of tests needed.

## 2014-11-07 NOTE — Telephone Encounter (Signed)
Dr. Dan HumphreysGoodchild called me.  Turns out this is NOT a pt of his, but a friend of his, so I didn't disclose any information.  He stated he just wanted to tell me about these episodes as he and his wife witnessed a few.  Pt will experience bright flashing lights and then become a bit rigid and sink to the ground and then event is over.  He cannot say if pt was awake and aware during entire event but does state that she spoke normally once on the ground.  No evidence of seizure activity per him.  Jade, please get eeg/ambulatory eeg.  No driving for now.  Still sounds like could be migraine variant (? Basilar).   If she has not had cardiac w/u, we should get that as well.

## 2014-11-07 NOTE — Telephone Encounter (Signed)
Referral placed for EEG, Amb EEG and Amb ref Cardiology. Note given to front desk to schedule EEG's. Called Cardiology and confirmed that referral is in their work queue. They will call patient directly with an appt.

## 2014-11-07 NOTE — Telephone Encounter (Signed)
Other encounter already open with documentation.

## 2014-11-07 NOTE — Telephone Encounter (Signed)
Got message:  Dr. Dan HumphreysGoodchild called in regards to this patient and would like you to call him back at [phone]/Dawn.    I returned call and it went to automated vm.  I left message to call me back and ask for me to get pulled from the room to talk.

## 2014-11-07 NOTE — Telephone Encounter (Signed)
Pt called and would asked to speak to you, call back number is [phone]/Dawn

## 2014-11-09 ENCOUNTER — Ambulatory Visit (HOSPITAL_COMMUNITY): Payer: Medicare PPO

## 2014-11-11 ENCOUNTER — Other Ambulatory Visit: Payer: Self-pay | Admitting: Neurology

## 2014-11-13 ENCOUNTER — Ambulatory Visit (HOSPITAL_COMMUNITY)
Admission: RE | Admit: 2014-11-13 | Discharge: 2014-11-13 | Disposition: A | Discharge status: Home / Self Care | Payer: Medicare PPO | Source: Ambulatory Visit | Attending: Neurology | Admitting: Neurology

## 2014-11-13 ENCOUNTER — Encounter (HOSPITAL_COMMUNITY): Payer: Self-pay

## 2014-11-13 DIAGNOSIS — I6523 Occlusion and stenosis of bilateral carotid arteries: Secondary | ICD-10-CM | POA: Diagnosis not present

## 2014-11-13 DIAGNOSIS — H538 Other visual disturbances: Secondary | ICD-10-CM | POA: Insufficient documentation

## 2014-11-13 DIAGNOSIS — G319 Degenerative disease of nervous system, unspecified: Secondary | ICD-10-CM | POA: Insufficient documentation

## 2014-11-13 DIAGNOSIS — H539 Unspecified visual disturbance: Secondary | ICD-10-CM

## 2014-11-13 DIAGNOSIS — I672 Cerebral atherosclerosis: Secondary | ICD-10-CM | POA: Diagnosis not present

## 2014-11-13 DIAGNOSIS — I700 Atherosclerosis of aorta: Secondary | ICD-10-CM | POA: Insufficient documentation

## 2014-11-13 DIAGNOSIS — Z8679 Personal history of other diseases of the circulatory system: Secondary | ICD-10-CM

## 2014-11-13 DIAGNOSIS — Z8673 Personal history of transient ischemic attack (TIA), and cerebral infarction without residual deficits: Secondary | ICD-10-CM | POA: Diagnosis not present

## 2014-11-13 LAB — POCT I-STAT CREATININE: Creatinine, Ser: 0.7 mg/dL (ref 0.44–1.00)

## 2014-11-13 MED ORDER — IOHEXOL 350 MG/ML SOLN
80.0000 mL | Freq: Once | INTRAVENOUS | Status: AC | PRN
  Administered 2014-11-13: 80 mL via INTRAVENOUS

## 2014-11-13 NOTE — Telephone Encounter (Signed)
Mirapex refill requested. Per last office note- patient to remain on medication. Refill approved and sent to patient's pharmacy.   

## 2014-11-14 ENCOUNTER — Telehealth: Payer: Self-pay | Admitting: Neurology

## 2014-11-14 NOTE — Telephone Encounter (Signed)
Left message on machine for patient to call back.

## 2014-11-14 NOTE — Telephone Encounter (Signed)
-----   Message from Octaviano Battyebecca S Tat, DO sent at 11/13/2014  4:25 PM EDT ----- Let pt know that there is some stenosis of vessels but nothing I would think that were causing sx's.  IF she can tolerate baby ASA (no hx GI bleed), then probably helpful for her to take that daily but make sure PCP okay with it.

## 2014-11-15 ENCOUNTER — Ambulatory Visit (INDEPENDENT_AMBULATORY_CARE_PROVIDER_SITE_OTHER): Payer: Medicare PPO | Admitting: Neurology

## 2014-11-15 DIAGNOSIS — R55 Syncope and collapse: Secondary | ICD-10-CM

## 2014-11-16 NOTE — Procedures (Signed)
ELECTROENCEPHALOGRAM REPORT  Date of Study: 11/15/18/16  Patient's Name: Elizabeth Mcintosh MRN: [id number] Date of Birth: 03/04/1946  Referring Provider: Dr. Lurena Joiner Tat  Clinical History: This is a 69 year old woman with a history of right-sided aneurysm clipping, with recurrent episodes of bright flashing lights, followed by becoming rigid then sinking to the ground. EEG for classification.   Medications: Clonazepam, Sinemet  Technical Summary: A multichannel digital EEG recording measured by the international 10-20 system with electrodes applied with paste and impedances below 5000 ohms performed in our laboratory with EKG monitoring in an awake and asleep patient.  Hyperventilation and photic stimulation were performed.  The digital EEG was referentially recorded, reformatted, and digitally filtered in a variety of bipolar and referential montages for optimal display.    Description: The patient is awake and asleep during the recording.  During maximal wakefulness, there is a symmetric, medium voltage 8 Hz posterior dominant rhythm that attenuates with eye opening.  There is occasional focal 2-3 Hz delta slowing seen independently and bisynchronously over the bilateral frontotemporal regions, right greater than left. During drowsiness and satge I sleep, there is an increase in theta slowing with occasional vertex waves seen.  Hyperventilation and photic stimulation did not elicit any epileptiform abnormalities. There was increased frequency of focal slowing seen during hyperventilation and sleep.  There were no epileptiform discharges or electrographic seizures seen.    EKG lead was unremarkable.  Impression: This awake and asleep EEG is abnormal due to occasional focal slowing over the bilateral frontotemporal regions, right greater than left.    Clinical Correlation of the above findings indicates focal cerebral dysfunction over the bilateral frontotemporal regions suggestive of underlying  structural or physiologic abnormality. The absence of epileptiform discharges does not exclude a clinical diagnosis of epilepsy.  If further clinical questions remain, prolonged EEG may be helpful.  Clinical correlation is advised.   Patrcia Dolly, M.D.

## 2014-11-20 NOTE — Telephone Encounter (Signed)
Left another message on machine for patient to call back. Also to make her aware of EEG results below.   You can let pt know that EEG didn't show any seizure waves and we will see if longer one scheduled in July shows anything to explain sx's

## 2014-11-21 NOTE — Telephone Encounter (Signed)
Letter written and mailed to patient with results.

## 2014-12-18 ENCOUNTER — Telehealth: Payer: Self-pay | Admitting: Neurology

## 2014-12-18 NOTE — Telephone Encounter (Signed)
Spoke with patient.  Her spells happen 3 times.  She is not feeling well since them.  Per Dr Tat patient told not to drive.  Will follow up EEG test this week.

## 2014-12-18 NOTE — Telephone Encounter (Signed)
Pt called and said she had a couple of spells and thinks she needs to come in to see the doctor/Dawn CB# [phone]

## 2014-12-20 ENCOUNTER — Ambulatory Visit (INDEPENDENT_AMBULATORY_CARE_PROVIDER_SITE_OTHER): Payer: Medicare PPO | Admitting: Neurology

## 2014-12-20 DIAGNOSIS — R55 Syncope and collapse: Secondary | ICD-10-CM | POA: Diagnosis not present

## 2014-12-25 NOTE — Procedures (Signed)
ELECTROENCEPHALOGRAM REPORT  Dates of Recording: 12/20/2014 to 12/21/2014  Patient's Name: Elizabeth Mcintosh MRN: [id number] Date of Birth: Jun 05, 1946  Referring Provider: Dr. Lurena Joinerebecca Tat  Procedure: 24-hour ambulatory EEG  History: This is a 69 year old woman with a history of right-sided aneurysm clipping, with recurrent episodes of bright flashing lights, followed by becoming rigid then sinking to the ground. EEG for classification.   Medications: Clonazepam, Sinemet  Technical Summary: This is a 24-hour multichannel digital EEG recording measured by the international 10-20 system with electrodes applied with paste and impedances below 5000 ohms performed as portable with EKG monitoring.  The digital EEG was referentially recorded, reformatted, and digitally filtered in a variety of bipolar and referential montages for optimal display.    DESCRIPTION OF RECORDING: During maximal wakefulness, the background activity consisted of a symmetric 9 Hz posterior dominant rhythm which was reactive to eye opening.  There is occasional focal 2-3 Hz delta slowing seen independently and bisynchronously over the bilateral temporal regions, right greater than left. There were no epileptiform discharges seen in wakefulness.  During the recording, the patient progresses through wakefulness, drowsiness, and Stage 2 sleep.  Similar occasional focal delta slowing is seen over the bilateral temporal regions, at times sharply contoured without clear epileptogenic potential. Again, there were no epileptiform discharges seen.  Events: On 07/14 at 0830 hours, patient reports dizziness. Electrographically, there were no EEG or EKG changes seen.  There were no electrographic seizures seen.  EKG lead was unremarkable.  IMPRESSION: This 24-hour ambulatory EEG study is abnormal due to occasional focal slowing over the bilateral temporal regions, right greater than left.  CLINICAL CORRELATION of the above findings  indicates focal cerebral dysfunction over the bilateral temporal regions suggestive of underlying structural or physiologic abnormality. The absence of epileptiform discharges does not exclude a clinical diagnosis of epilepsy. Typical episodes were not captured. Episode of dizziness did not show electrographic correlate. If further clinical questions remain, inpatient video EEG monitoring may be helpful.    Patrcia DollyKaren Aquino, M.D.

## 2014-12-26 ENCOUNTER — Telehealth: Payer: Self-pay | Admitting: Neurology

## 2014-12-26 NOTE — Telephone Encounter (Signed)
Left message on machine for patient to call back.

## 2014-12-26 NOTE — Telephone Encounter (Signed)
-----   Message from Octaviano Battyebecca S Tat, DO sent at 12/26/2014  7:35 AM EDT ----- Let pt know that EEG didn't show seizures.  Was some nonspecific slowing of brain waves which is commonly seen in pts of this age group.  Did she ever have cardiac w/u as previously recommended in phone note?

## 2014-12-27 NOTE — Telephone Encounter (Signed)
Patient made aware of results. Has a cardiac consult scheduled for 01/04/15.

## 2014-12-28 ENCOUNTER — Ambulatory Visit
Admission: RE | Admit: 2014-12-28 | Discharge: 2014-12-28 | Disposition: A | Discharge status: Home / Self Care | Payer: Medicare PPO | Source: Ambulatory Visit | Attending: Neurology | Admitting: Neurology

## 2014-12-28 ENCOUNTER — Encounter: Payer: Self-pay | Admitting: Neurology

## 2014-12-28 ENCOUNTER — Ambulatory Visit (INDEPENDENT_AMBULATORY_CARE_PROVIDER_SITE_OTHER): Payer: Medicare PPO | Admitting: Neurology

## 2014-12-28 VITALS — BP 106/70 | HR 84 | Ht 68.5 in | Wt 158.0 lb

## 2014-12-28 DIAGNOSIS — I951 Orthostatic hypotension: Secondary | ICD-10-CM

## 2014-12-28 DIAGNOSIS — W19XXXA Unspecified fall, initial encounter: Secondary | ICD-10-CM

## 2014-12-28 DIAGNOSIS — S0990XA Unspecified injury of head, initial encounter: Secondary | ICD-10-CM

## 2014-12-28 DIAGNOSIS — G232 Striatonigral degeneration: Secondary | ICD-10-CM | POA: Diagnosis not present

## 2014-12-28 MED ORDER — PRAMIPEXOLE DIHYDROCHLORIDE 0.5 MG PO TABS: 0.5000 mg | ORAL_TABLET | Freq: Three times a day (TID) | ORAL | Status: DC

## 2014-12-28 MED ORDER — ABDOMINAL BINDER/ELASTIC MED MISC: 1.0000 | Freq: Every day | Status: DC

## 2014-12-28 NOTE — Progress Notes (Signed)
Elizabeth Mcintosh was seen today in the movement disorders clinic for neurologic consultation at the request of [name],[name], MD.  The consultation is for the evaluation of abnormal gait and "awkwardness" and weakness on the R side.  Pt states that they were away in [place] and [place] in October, 2014 and she felt like she just didn't bounce back after that.  She just felt "slow" after.  In Jan, she went to ortho to get an injection in her shoulder b/c of an impingement syndrome but still felt slow after PT, although she did good in PT.  She tried to d/c all the meds that she thought that she thought would interfere and could cause slowness - benadryl and unisom.  She feels that her right arm doesn't swing.  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  11/22/13 update:  Pt returns today for f/u, accompanied by her daughter who supplements the history.  The patient was diagnosed with Parkinson's disease last visit.  We started levodopa.  The patient reports that she is moving easier.  Her friends noticed that she is more alert.  She did her physical therapies and is planning on starting a cardiovascular exercise program, but has not figured out how exactly to do that or what to do.  She and her daughter bring a long list of questions.  Her daughter asks me about recognition of depression.  The patient denies this, but her daughter states that she does not think that the patient would be able to recognize it, and her daughter thinks that there is some depression.  The patient is very active, especially in regards to travel.  She will be traveling to [place] in July and then will be traveling again overseas in October.  She asks me about refilling her Ambien for insomnia.  Her daughter asks me about drinking alcohol.  Last visit, the patient reported that she drank about 3 or 4 glasses of wine per night, each totaling 6-8 ounces.  No falls.  No hallucinations.  No lightheadedness.    02/22/14  update:  Pt accompanied by her son, who supplements the history.  Pt saw Dr. Conley Canal since last visit.  No evidence of dementia.  Believed had adjustment d/o related to dx of PD and pt agreed to go to counseling with Dr. Conley Canal.  Dr. Conley Canal also discussed with her tapering alcohol use, as I have also.  Pt states that she was told that she could still have 3 drinks per night.   She has increased her carbidopa/levodopa 25/100 to 2 in the AM, 1 in the afternoon, 1 in the evening.  Pt states that she has more energy after going up in the AM.  Her BP was really low today, and she states that it "is always low."  She is sometimes dizzy but it is not bad.  She is getting up in the middle of the night frequently to use the bathroom and cannot sleep.  She wonders if the klonopin contributes to that.  She is exercising more; went 5 days last week.    06/27/14 update:  The patient is following up today.  She is accompanied by her husband who supplements the history.  She is currently on carbidopa/levodopa 25/100, 2 tablets in the morning, one in the afternoon and one in the evening.  Pramipexole was started last visit and worked up to 0.5 g 3 times a day.  She stopped  this and ended up restarting it in November.  She has not noticed a huge difference with this medication.  She did call me in November with an episode of vision change where everything went bright.  It lasted for about 7-10 minutes and she felt somewhat dizzy.  She is not able to have an MRI because of prior aneurysm clips.  A CT of the brain was done and did not reveal any changes.  A carotid ultrasound demonstrated less than 50% stenosis bilaterally.  She was just evaluated for PT/OT/ST on 06/08/2014.  She has been doing a lot of travel - [place], [place], [place] and she did well with a lot of walking.  Her husband states that she did pretty well.  She is working out with a Systems analyst with at [employer].  No falls but feels that she is "lurching"  forward.  Mood is good.  Trouble sleeping.  States that Dr. Timothy Lasso increased her klonopin to 1.5 tablets at night.  Still drinking 2 glasses wine per night but that is down from previously.  10/26/14 update:  The patient is following up today, accompanied by her daughter who supplements the history.  She is on carbidopa/levodopa 25/100, 2 in the morning, one in the afternoon and one in the evening along with pramipexole, 0.5 mg 3 times per day.  She states that she misses one lunchtime pill a week because she forgets. Pt states that she fell 2 days ago.   She was quickly walking into the kitchen and fell into the drying rack in front of the washer and she fell into her shoulder.  No LOC.  Her husband helped her get back up.  Her daughter thinks that she had another fall not long after our last visit; she stepped outside on the ice and went out to get the paper and fell.  She has just finished PT/OT/ST and she states that she learned strategies and she learned exercises.  She has a Systems analyst at the [facility] that she sees once per week and she goes 3 other times throughout the week.  She is on a combination of clonazepam and 3 mg of melatonin for insomnia.  She states that is working well.  She states that her PCP started myrbetriq since last visit and it has helped her bladder.  She does state that she is having cramping of the feet and legs about 3 nights a week.  She continues to drink a glass of wine at lunchtime and 1 "small" glass of liquor in the evening.  She has developed some diplopia and is getting new glasses.  She states that she will not have prisms in the glasses.  12/28/14 update:  The patient is following up today.  She is accompanied by her son and daughter who supplement the history.   She is on carbidopa/levodopa 25/100, 2 tablets in the morning, one in the afternoon and one in the evening in addition to pramipexole, 0.5 mg 3 times per day.  Last visit, I also added carbidopa/levodopa 50/200 at  night because she was complaining about toe curling.  This really helped that problem.   Since our last visit, however, there have been multiple calls from the patient as well as from one of her physician friends complaining about "spells."  These spells will consist of having fragmented vision as if she is looking through a prism and then she feels like she is moving slowly.  She had a CTA of the  head and neck because of these.  It was essentially unremarkable.  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  There was no evidence of recurrent aneurysm.  Her physician friend called me on 11/07/2014 to tell me that he had witnessed these episodes.  The patient complained about bright flashing lights before the episode and then she seemed a little rigid and sunk to the ground.  He was not sure if there was loss of consciousness as she just sunk to the ground, but he did note that when he spoke to her on the ground, she was completely awake and aware.  She had an EEG that did not demonstrate any seizures and then had an ambulatory EEG that just demonstrated temporal delta, without epileptiform activity.  She does have a cardiac consult pending on 01/04/2015.   Her daughter states that a "spell" seems to start with a swaying and then the body seems to be unable to support her.  She seems slow to communicate but is able.  It seems unrelated to dosing timing.  After an episode she seems to need to be in bed all day.  There may be some lightheadedness.  If she sees a prism of light, it lasts a few min and doesn't occur before but during an episode.  Today, they say there is never LOC.  Her face seems white during an episode.  An episode never happens while seated.  An episode is happening in clusters.  A week ago, she had 3 in a day and then this past week she had 1 episode.    Besides for the episodes, her daughter states that they have seen a steep physical decline.  They have seen more  shuffling.  The right leg feels like a "peg leg."   Daughter relates that in June she fell hard on concrete and hit her head and had a very large knot on her posterior occiput and refused to go and get it evaluated.   She is still exercising but hasn't been the last 2 weeks because she hasn't been feeling as well.  She is drinking a glass of wine a day.  She is still on klonopin, 1.5 tablets at night (increased in Jan by Dr. Timothy Lasso).   Neuroimaging has  previously been performed.  It is available for my review today.  CT of the brain was done on 07/30/13 demonstrating encephalomalacia in the R frontal region and prior aneurysm clips.   Pt reports that the aneursym happened when she was pregnant and it ruptured.  PREVIOUS MEDICATIONS: none to date  ALLERGIES:   Allergies  Allergen Reactions  . Penicillins     *childhood allergy*    CURRENT MEDICATIONS:  Current Outpatient Prescriptions on File Prior to Visit  Medication Sig Dispense Refill  . bimatoprost (LUMIGAN) 0.01 % SOLN Place 1 drop into both eyes at bedtime.    . carbidopa-levodopa (SINEMET CR) 50-200 MG per tablet Take 1 tablet by mouth at bedtime. 30 tablet 5  . carbidopa-levodopa (SINEMET IR) 25-100 MG per tablet TAKE 2 TABLETS IN THE AM, 1 TABLET AT NOON, 1 IN THE EVENING 120 tablet 5  . clonazePAM (KLONOPIN) 0.5 MG tablet Take 1 tablet (0.5 mg total) by mouth at bedtime. (Patient taking differently: Take 0.75 mg by mouth at bedtime. ) 30 tablet 5  . LYSINE PO Take by mouth.    . Melatonin 5 MG CAPS Take by mouth at bedtime.    Marland Kitchen  MYRBETRIQ 25 MG TB24 tablet     . pramipexole (MIRAPEX) 0.5 MG tablet Take 1 tablet (0.5 mg total) by mouth 3 (three) times daily. 90 tablet 5  . timolol (TIMOPTIC) 0.5 % ophthalmic solution Place 1 drop into both eyes every morning.     No current facility-administered medications on file prior to visit.    PAST MEDICAL HISTORY:   Past Medical History  Diagnosis Date  . Aneurysm     s/p clips  .  Glaucoma   . Parkinson's disease     PAST SURGICAL HISTORY:   Past Surgical History  Procedure Laterality Date  . Craniotomy    . Aneurym clipping      SOCIAL HISTORY:   History   Social History  . Marital Status: Married    Spouse Name: N/A  . Number of Children: N/A  . Years of Education: N/A   Occupational History  . retired     Copy, Music therapist   Social History Main Topics  . Smoking status: Former Games developer  . Smokeless tobacco: Not on file     Comment: quit 40 years ago  . Alcohol Use: 0.0 oz/week    0 Standard drinks or equivalent per week     Comment: 1-2 glasses wine/day (6-8 oz in each glass)  . Drug Use: No  . Sexual Activity: Yes   Other Topics Concern  . Not on file   Social History Narrative    FAMILY HISTORY:   Family Status  Relation Status Death Age  . Mother Deceased 73    fall  . Father Deceased 60    CAD, renal failure  . Brother Deceased     CVA, MI after hip replacement  . Brother Alive     bladder CA  . Child Alive     healthy    ROS:  A complete 10 system review of systems was obtained and was unremarkable apart from what is mentioned above.  PHYSICAL EXAMINATION:    VITALS:   Filed Vitals:   12/28/14 1237  BP: 106/70  Pulse: 84  Height: 5' 8.5" (1.74 m)  Weight: 158 lb (71.668 kg)    GEN:  The patient appears stated age and is in NAD.  More disshelved than previously HEENT:  Normocephalic, atraumatic.  The mucous membranes are moist. The superficial temporal arteries are without ropiness or tenderness. CV:  RRR Lungs:  CTAB.  She does have some inspiratory gasps. Neck/HEME:  There are no carotid bruits bilaterally.  Neurological examination:  Orientation: The patient is alert and oriented x3. Fund of knowledge is appropriate.  Recent and remote memory are intact.  Attention and concentration are normal.    Able to name objects and repeat phrases. Cranial nerves: There is good facial symmetry.   Extraocular  muscles are intact.  There are no square wave jerks.  The visual fields are full to confrontational testing. The speech is fluent and clear. Soft palate rises symmetrically and there is no tongue deviation. Hearing is intact to conversational tone. Sensation: Sensation is intact to light and pinprick throughout (facial, trunk, extremities). Vibration is intact at the bilateral big toe. There is no extinction with double simultaneous stimulation. There is no sensory dermatomal level identified.   Motor: Strength is 5/5 in the bilateral upper and lower extremities.   Shoulder shrug is equal and symmetric.  There is no pronator drift.   Movement examination: Tone: There is mild increased tone in the RUE.  Tone  in the left upper extremity is normal today.  Tone in the bilateral lower extremities is normal. Abnormal movements: There is no tremor, even with distraction techniques. Coordination:  There is decremation with RAM's on the right Gait and Station: The patient has no significant difficulty arising out of a deep-seated chair without the use of the hands. The patient's stride length is normal today.  She has decreased arm swing on the right.  I    ASSESSMENT/PLAN:  1.  Parkinsonism , diagnosed 09/20/2013 with symptoms since October, 2014.  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  - Continue carbidopa/levodopa 25/100 2 tablets in the morning, with one in the afternoon and one in the evening.    -rigidity much improved after mirapex  0.5 mg tid.  Risks, benefits, side effects and alternative therapies were discussed.  The opportunity to ask questions was given and they were answered to the best of my ability.  The patient expressed understanding and willingness to follow the outlined treatment protocols.  She denies any compulsive behaviors.  -cramping in feet improved after the addition of carbidopa/levodopa 50/200 at night.  However, clinically, she seemed to go downhill not  long after that.  It would be hard to believe that it would be because of this medication, but I told her we hold it for now.  She really did not want to do that because the cramping is so much better.  -I stressed again that I really want her to decrease her alcohol intake and would like her to discontinue it altogether.  -talked about the diagnosis of MSA again, as this is a very different prognosis than Parkinson's disease.  -CT brain today given fall and hit head and clinically going down hill since. 2.  Spells  -Long discussion with the patient today.  I really do not think that her episodes represent seizure.  Her ambulatory EEGs have not demonstrated seizure, and the description really does not sound like seizure as well.  The description today sounds much more like orthostatic hypotension but she really was not orthostatic in the office today.  Has appt with cardiology next week.  I will await Dr. Blanchie Dessert input.  She is hoping to avoid a tilt table test.  Because her blood pressure was actually high when she sat when we did orthostatics, I really did not want to add florinef or midodrine.  Therefore, I gave her RX for medium compression stocking and abdominal binder and talked about use of these first.   3.  Urinary frequency  -saw gyn.  Doing better on myrbetriq 4.  Sleep difficulty  -on klonopin and melatonin, 3 mg, and doing better.   Talked about role that EtOH can play in disrupting sleep.  Asked for refill on klonopin but Dr. Timothy Lasso last refilled it at higher dose so deferred to him on this today. 5.  F/u after cardiology appt.  Much greater than 50% of this visit was spent in counseling with the patient and the family.  Total face to face time:  45 min

## 2014-12-29 ENCOUNTER — Telehealth: Payer: Self-pay | Admitting: Neurology

## 2014-12-29 NOTE — Telephone Encounter (Signed)
Tried to call patient on both numbers. Voicemail full on one and voicemail not set up on another. Will try again later.

## 2014-12-29 NOTE — Telephone Encounter (Signed)
-----   Message from Octaviano Batty Tat, DO sent at 12/28/2014  4:09 PM EDT ----- Please let pt know head CT stable

## 2014-12-29 NOTE — Telephone Encounter (Signed)
Patient made aware to get the medium stockings and aware CT is stable.

## 2014-12-29 NOTE — Telephone Encounter (Signed)
Pt states that she saw Dr Tat yesterday and she order some stocking and they need to know the size of the stockings please call [phone]

## 2015-01-04 ENCOUNTER — Ambulatory Visit (INDEPENDENT_AMBULATORY_CARE_PROVIDER_SITE_OTHER): Payer: Medicare PPO | Admitting: Internal Medicine

## 2015-01-04 VITALS — BP 84/59 | HR 79 | Ht 68.5 in | Wt 158.8 lb

## 2015-01-04 DIAGNOSIS — F329 Major depressive disorder, single episode, unspecified: Secondary | ICD-10-CM

## 2015-01-04 DIAGNOSIS — I951 Orthostatic hypotension: Secondary | ICD-10-CM | POA: Diagnosis not present

## 2015-01-04 DIAGNOSIS — R55 Syncope and collapse: Secondary | ICD-10-CM

## 2015-01-04 DIAGNOSIS — G20 Parkinson's disease: Secondary | ICD-10-CM

## 2015-01-04 DIAGNOSIS — G20A1 Parkinson's disease without dyskinesia, without mention of fluctuations: Secondary | ICD-10-CM

## 2015-01-04 DIAGNOSIS — F32A Depression, unspecified: Secondary | ICD-10-CM

## 2015-01-04 MED ORDER — DROXIDOPA 100 MG PO CAPS: 1.0000 | ORAL_CAPSULE | Freq: Three times a day (TID) | ORAL | Status: DC

## 2015-01-04 NOTE — Patient Instructions (Signed)
Your physician has recommended you make the following change in your medication:  >> START Droxidopa  three times daily or every 8 hours  Your physician has requested that you have an echocardiogram @ [address]. Echocardiography is a painless test that uses sound waves to create images of your heart. It provides your doctor with information about the size and shape of your heart and how well your heart's chambers and valves are working. This procedure takes approximately one hour. There are no restrictions for this procedure.  Your physician recommends that you schedule a follow-up appointment in 2 weeks with Dr. Rennis Golden - OK to double book

## 2015-01-05 ENCOUNTER — Encounter: Payer: Self-pay | Admitting: Internal Medicine

## 2015-01-05 DIAGNOSIS — I951 Orthostatic hypotension: Secondary | ICD-10-CM | POA: Insufficient documentation

## 2015-01-05 NOTE — Progress Notes (Signed)
OFFICE NOTE  Chief Complaint:  Positional dizziness, pre-syncope, hypotension  Primary Care Physician: Gwen Pounds, MD  HPI:  Elizabeth Mcintosh is a pleasant 69 year old female kindly referred to me by Dr. Arbutus Leas with neurology for evaluation of postural hypotension and autonomic dysfunction. Mrs. Demonte was diagnosed with Parkinson's disease and likely has multisystem atrophy. Unfortunately she has autonomic involvement and his had problems with hypotension and dizziness as well as positional presyncopal symptoms. She's been prescribed a lower extremity compression stockings in an abdominal binder and there is discussion about whether or not we need to start medications in order to help with blood pressure. She's been found to be orthostatic in the past and blood pressure today is actually quite low at 84/59 in the office. She's never had a true syncopal event however that is likely to happen in the near future. She denies any chest pain or worsening shortness of breath. She's never had an echocardiogram.  PMHx:  Past Medical History  Diagnosis Date  . Aneurysm     s/p clips  . Glaucoma   . Parkinson's disease     Past Surgical History  Procedure Laterality Date  . Craniotomy    . Aneurym clipping      FAMHx:  Family History  Problem Relation Age of Onset  . Dementia Mother   . Heart attack Father   . Kidney failure Father   . Stroke Brother   . Cancer Brother     Prostate  . Diabetes Brother   . Aneurysm Brother     SOCHx:   reports that she has quit smoking. She does not have any smokeless tobacco history on file. She reports that she drinks alcohol. She reports that she does not use illicit drugs.  ALLERGIES:  Allergies  Allergen Reactions  . Penicillins     *childhood allergy*    ROS: A comprehensive review of systems was negative except for: Neurological: positive for dizziness and Parkinsonism  HOME MEDS: Current Outpatient Prescriptions  Medication Sig  Dispense Refill  . atorvastatin (LIPITOR) 10 MG tablet Take 10 mg by mouth daily.    . bimatoprost (LUMIGAN) 0.01 % SOLN Place 1 drop into both eyes at bedtime.    . carbidopa-levodopa (SINEMET CR) 50-200 MG per tablet Take 1 tablet by mouth at bedtime. 30 tablet 5  . carbidopa-levodopa (SINEMET IR) 25-100 MG per tablet TAKE 2 TABLETS IN THE AM, 1 TABLET AT NOON, 1 IN THE EVENING 120 tablet 5  . clonazePAM (KLONOPIN) 0.5 MG tablet Take 1 tablet (0.5 mg total) by mouth at bedtime. (Patient taking differently: Take 0.75 mg by mouth at bedtime. ) 30 tablet 5  . Elastic Bandages & Supports (ABDOMINAL BINDER/ELASTIC MED) MISC 1 Device by Does not apply route daily. 1 each 0  . fluticasone (VERAMYST) 27.5 MCG/SPRAY nasal spray Place 2 sprays into the nose daily.    Marland Kitchen glucosamine-chondroitin 500-400 MG tablet Take 1 tablet by mouth daily.    Marland Kitchen LYSINE PO Take by mouth.    . Melatonin 5 MG CAPS Take by mouth at bedtime.    Marland Kitchen MYRBETRIQ 25 MG TB24 tablet     . pramipexole (MIRAPEX) 0.5 MG tablet Take 1 tablet (0.5 mg total) by mouth 3 (three) times daily. 90 tablet 5  . timolol (TIMOPTIC) 0.5 % ophthalmic solution Place 1 drop into both eyes every morning.    . Droxidopa 100 MG CAPS Take 1 capsule by mouth 3 (three) times daily. 90 capsule 3  No current facility-administered medications for this visit.    LABS/IMAGING: No results found for this or any previous visit (from the past 48 hour(s)). No results found.  WEIGHTS: Wt Readings from Last 3 Encounters:  01/04/15 158 lb 12.8 oz (72.031 kg)  12/28/14 158 lb (71.668 kg)  10/26/14 159 lb (72.122 kg)    VITALS: BP 84/59 mmHg  Pulse 79  Ht 5' 8.5" (1.74 m)  Wt 158 lb 12.8 oz (72.031 kg)  BMI 23.79 kg/m2  EXAM: General appearance: alert and Slowed mentation, masked bases with a flat affect Neck: no carotid bruit and no JVD Lungs: clear to auscultation bilaterally Heart: regular rate and rhythm, S1, S2 normal, no murmur, click, rub or  gallop Abdomen: soft, non-tender; bowel sounds normal; no masses,  no organomegaly Extremities: extremities normal, atraumatic, no cyanosis or edema Pulses: 2+ and symmetric Skin: Skin color, texture, turgor normal. 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 Depression 4. Presyncope  PLAN: 1.   Mrs. Michel is having autonomic symptoms related to her advanced Parkinson's disease/MSA. This is likely related to autonomic postural hypotension. Since she is symptomatic and is had orthostatic vitals, tilt table testing is not indicated. It would be helpful to get an echocardiogram to rule out a cardiomyopathy is possible contributing factor although her exam is not consistent with heart failure. I suspect this is true autonomic dysfunction and agree with peripheral vasoconstriction in the form of abdominal binding and thigh-high 20-30 mmHg compression stockings. She would also benefit from medication to try to help raise blood pressure. Options include midodrine and Florinef, as well as a new medication called Droxadopa. I think this might be a good option for her as it seems to be particularly effective in patients with known autonomic postural hypotension in the setting of Parkinson's disease, especially on Sinemet. I recommend we started 100 mg 3 times a day and consider up titrating the medication based on blood pressure response when I see her back in one month. This is a new medicine which may be expensive for her, it will likely require prior authorization. If her some reason this is not affordable or tolerated, then I would recommend starting her on midodrine.  Thanks for the kind referral.  Chrystie Nose, MD, [facility] Attending  Cardiologist [facility]  Chrystie Nose 01/05/2015, 10:21 AM

## 2015-01-11 ENCOUNTER — Telehealth: Payer: Self-pay | Admitting: Internal Medicine

## 2015-01-11 ENCOUNTER — Ambulatory Visit (HOSPITAL_COMMUNITY): Payer: Medicare PPO | Attending: Cardiovascular Disease

## 2015-01-11 ENCOUNTER — Other Ambulatory Visit: Payer: Self-pay

## 2015-01-11 DIAGNOSIS — Z87891 Personal history of nicotine dependence: Secondary | ICD-10-CM | POA: Insufficient documentation

## 2015-01-11 DIAGNOSIS — Z8249 Family history of ischemic heart disease and other diseases of the circulatory system: Secondary | ICD-10-CM | POA: Diagnosis not present

## 2015-01-11 DIAGNOSIS — G20 Parkinson's disease: Secondary | ICD-10-CM | POA: Insufficient documentation

## 2015-01-11 DIAGNOSIS — R55 Syncope and collapse: Secondary | ICD-10-CM

## 2015-01-11 DIAGNOSIS — I951 Orthostatic hypotension: Secondary | ICD-10-CM

## 2015-01-11 NOTE — Telephone Encounter (Signed)
Elizabeth Mcintosh called in stating that Dr.Hilty was going to prescribed [employer] for this pt but first Dr. Rennis Golden needs to enroll with the company order to prescribe the medication. He can call the company at [phone] or go on the website at [email or web address].  Thanks

## 2015-01-11 NOTE — Telephone Encounter (Signed)
Spoke with [employer] support center. Per rep, when medication is prescribed there is paperwork that has to be submitted to [employer]. Downloaded form and completed as much information as possible - MD will need to review and complete and patient will need to sign. Form is on MD cart.

## 2015-01-12 NOTE — Telephone Encounter (Signed)
Can this be closed ?

## 2015-01-15 NOTE — Telephone Encounter (Signed)
Spoke with patient and explained that paperwork has to be completed for [employer] Rx to be processed and that this will require her signature as well. Patient is scheduled for OV 01/19/15

## 2015-01-15 NOTE — Telephone Encounter (Signed)
Attempted to contact patient - NA, voicemail box full

## 2015-01-15 NOTE — Telephone Encounter (Signed)
Pt is calling in wanting to know the status of the medication he wanted her to take. Please f/u with pt   Thanks

## 2015-01-16 NOTE — Telephone Encounter (Signed)
I will complete this form tomorrow.  Dr. Rexene Edison

## 2015-01-19 ENCOUNTER — Encounter: Payer: Self-pay | Admitting: Internal Medicine

## 2015-01-19 ENCOUNTER — Ambulatory Visit (INDEPENDENT_AMBULATORY_CARE_PROVIDER_SITE_OTHER): Payer: Medicare PPO | Admitting: Internal Medicine

## 2015-01-19 VITALS — BP 104/67 | HR 74 | Ht 68.5 in | Wt 156.5 lb

## 2015-01-19 DIAGNOSIS — G20 Parkinson's disease: Secondary | ICD-10-CM | POA: Diagnosis not present

## 2015-01-19 DIAGNOSIS — F32A Depression, unspecified: Secondary | ICD-10-CM

## 2015-01-19 DIAGNOSIS — F329 Major depressive disorder, single episode, unspecified: Secondary | ICD-10-CM

## 2015-01-19 DIAGNOSIS — I951 Orthostatic hypotension: Secondary | ICD-10-CM

## 2015-01-19 NOTE — Patient Instructions (Signed)
Dr. Rennis Golden recommends that you obtain THIGH HIGH compression stockings 20-48mmHg for both legs  We will work on getting [facility] approved  Your physician recommends that you schedule a follow-up appointment in: 2 months with Dr. Rennis Golden

## 2015-01-19 NOTE — Progress Notes (Signed)
OFFICE NOTE  Chief Complaint:  Follow-up postural hypotension  Primary Care Physician: Gwen Pounds, MD  HPI:  Elizabeth Mcintosh is a pleasant 69 year old female kindly referred to me by Dr. Arbutus Leas with neurology for evaluation of postural hypotension and autonomic dysfunction. 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 Unfortunately she has autonomic involvement and his had problems with hypotension and dizziness as well as positional presyncopal symptoms. She's been prescribed a lower extremity compression stockings in an abdominal binder and there is discussion about whether or not we need to start medications in order to help with blood pressure. She's been found to be orthostatic in the past and blood pressure today is actually quite low at 84/59 in the office. She's never had a true syncopal event however that is likely to happen in the near future. She denies any chest pain or worsening shortness of breath. She's never had an echocardiogram.  I saw Elizabeth Mcintosh back in the office today. She underwent an echocardiogram which shows normal LV systolic function and mild diastolic dysfunction. This is reassuring that her orthostatic hypotension is not related to a significant cardiomyopathy. I do believe is related to autonomic dysfunction. We talked about options for management and I feel that she may be a good candidate for the relatively new medicine Northera (droxidopa).   PMHx:  Past Medical History  Diagnosis Date  . Aneurysm     s/p clips  . Glaucoma   . Parkinson's disease     Past Surgical History  Procedure Laterality Date  . Craniotomy    . Aneurym clipping      FAMHx:  Family History  Problem Relation Age of Onset  . Dementia Mother   . Heart attack Father   . Kidney failure Father   . Stroke Brother   . Cancer Brother     Prostate  . Diabetes Brother   . Aneurysm Brother     SOCHx:   reports that she has quit smoking. She  does not have any smokeless tobacco history on file. She reports that she drinks alcohol. She reports that she does not use illicit drugs.  ALLERGIES:  Allergies  Allergen Reactions  . Penicillins     *childhood allergy*    ROS: A comprehensive review of systems was negative except for: Neurological: positive for dizziness and Parkinsonism  HOME MEDS: Current Outpatient Prescriptions  Medication Sig Dispense Refill  . atorvastatin (LIPITOR) 10 MG tablet Take 10 mg by mouth daily.    . benzonatate (TESSALON) 100 MG capsule Take 1 capsule by mouth daily.  0  . bimatoprost (LUMIGAN) 0.01 % SOLN Place 1 drop into both eyes at bedtime.    . carbidopa-levodopa (SINEMET CR) 50-200 MG per tablet Take 1 tablet by mouth at bedtime. 30 tablet 5  . carbidopa-levodopa (SINEMET IR) 25-100 MG per tablet TAKE 2 TABLETS IN THE AM, 1 TABLET AT NOON, 1 IN THE EVENING 120 tablet 5  . clonazePAM (KLONOPIN) 0.5 MG tablet Take 1 tablet (0.5 mg total) by mouth at bedtime. (Patient taking differently: Take 0.75 mg by mouth at bedtime. ) 30 tablet 5  . Elastic Bandages & Supports (ABDOMINAL BINDER/ELASTIC MED) MISC 1 Device by Does not apply route daily. 1 each 0  . fluticasone (VERAMYST) 27.5 MCG/SPRAY nasal spray Place 2 sprays into the nose daily.    Marland Kitchen glucosamine-chondroitin 500-400 MG tablet Take 1 tablet by mouth daily.    Marland Kitchen LYSINE PO Take by  mouth.    . Melatonin 5 MG CAPS Take by mouth at bedtime.    Marland Kitchen MYRBETRIQ 25 MG TB24 tablet     . pramipexole (MIRAPEX) 0.5 MG tablet Take 1 tablet (0.5 mg total) by mouth 3 (three) times daily. 90 tablet 5  . timolol (TIMOPTIC) 0.5 % ophthalmic solution Place 1 drop into both eyes every morning.    . Droxidopa 100 MG CAPS Take 1 capsule by mouth 3 (three) times daily. (Patient not taking: Reported on 01/19/2015) 90 capsule 3   No current facility-administered medications for this visit.    LABS/IMAGING: No results found for this or any previous visit (from the  past 48 hour(s)). No results found.  WEIGHTS: Wt Readings from Last 3 Encounters:  01/19/15 156 lb 8 oz (70.988 kg)  01/04/15 158 lb 12.8 oz (72.031 kg)  12/28/14 158 lb (71.668 kg)    VITALS: BP 104/67 mmHg  Pulse 74  Ht 5' 8.5" (1.74 m)  Wt 156 lb 8 oz (70.988 kg)  BMI 23.45 kg/m2  EXAM: Deferred  EKG: Deferred  ASSESSMENT: 1. Autonomic postural hypotension 2. 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 Depression 4. Presyncope  PLAN: 1.   Elizabeth Mcintosh seems like a good candidate for droxidopa. We've completed paperwork today to submit for insurance coverage. This is filled through a specialty pharmacy. The dosing will be 100 mg 3 times a day for 2 days then escalating to 200 mg twice a day for 2 days, etc. up to 600 mg 3 times a day. We discussed possible side effects including neurologic malignant syndrome and symptoms to look for. She may need to have dose adjustments in her Sinemet by her neurologist as her dose increases. I will include her on this note so that she is aware of possible side effects on droxidopa. Another thing that was mentioned to Elizabeth Mcintosh was to look out for supine hypertension which could be significant. She is to contact us or her neurologist if she has any new or worrisome symptoms related to very high blood pressure, over 160 systolic or fever, muscle rigidity, etc.  Plan to see her back in about 2 months after she's been established on her medication. She should continue to use compression stockings and her abdominal binder.  Chrystie Nose, MD, [facility] Attending Cardiologist [facility]  Chrystie Nose 01/19/2015, 5:14 PM

## 2015-01-20 ENCOUNTER — Other Ambulatory Visit: Payer: Self-pay | Admitting: Neurology

## 2015-01-22 ENCOUNTER — Telehealth: Payer: Self-pay | Admitting: *Deleted

## 2015-01-22 NOTE — Telephone Encounter (Signed)
Faxed Northera Treatment and Prescription Forms

## 2015-01-22 NOTE — Telephone Encounter (Signed)
Carbidopa Levodopa 25/100 refill requested. Per last office note- patient to remain on medication. Refill approved and sent to patient's pharmacy.   

## 2015-01-24 ENCOUNTER — Other Ambulatory Visit: Payer: Self-pay | Admitting: Neurology

## 2015-01-24 NOTE — Telephone Encounter (Signed)
Carbidopa Levodopa 25/100 refill requested. Per last office note- patient to remain on medication. Refill approved and sent to patient's pharmacy.   

## 2015-01-31 NOTE — Telephone Encounter (Signed)
Northera  capsule approved until 04/29/2015.

## 2015-02-01 ENCOUNTER — Encounter: Payer: Self-pay | Admitting: Neurology

## 2015-02-01 ENCOUNTER — Telehealth: Payer: Self-pay | Admitting: Internal Medicine

## 2015-02-01 ENCOUNTER — Telehealth: Payer: Self-pay | Admitting: *Deleted

## 2015-02-01 NOTE — Telephone Encounter (Signed)
Patient made aware we can access records through our system. Results printed and given to Dr Tat. Will call patient if needed.

## 2015-02-01 NOTE — Telephone Encounter (Signed)
Spoke with patient and informed her that Lorin Picket is approved. She states she has the phone number to [facility] support center. She will reach out to them as necessary

## 2015-02-01 NOTE — Telephone Encounter (Signed)
Attempted to reach patient. Full voice mail box on home line, cell phone goes directly to inactive voice mail box - no method for leaving message on either line.

## 2015-02-01 NOTE — Telephone Encounter (Signed)
Patient had a fall on yesterday and was at [facility] med. In [place]. Patient had a CT scan done and would like to know if you need to see this please advise Call back number [phone]

## 2015-02-01 NOTE — Telephone Encounter (Signed)
Pt is returning Jenna's call. She believes it was in regards to some medication. Please call back  Thanks

## 2015-02-02 NOTE — Telephone Encounter (Signed)
Patient was made aware that Dr Tat was reviewing records and we would call with any instructions if needed.

## 2015-02-02 NOTE — Telephone Encounter (Signed)
I reviewed records.  You can send her my chart message (if she is on it) and thank her for letting me know she was in and let her know that I reviewed.

## 2015-02-05 ENCOUNTER — Telehealth: Payer: Self-pay | Admitting: *Deleted

## 2015-02-05 NOTE — Telephone Encounter (Signed)
Tried to call patient back with no answer and no way to leave message.  

## 2015-02-05 NOTE — Telephone Encounter (Signed)
Patient calling in reference to her abdominal binder  Call back number [phone]

## 2015-02-06 ENCOUNTER — Telehealth: Payer: Self-pay | Admitting: Internal Medicine

## 2015-02-06 NOTE — Telephone Encounter (Signed)
Patient made aware.

## 2015-02-06 NOTE — Telephone Encounter (Signed)
Spoke with patient She states blood pressure range has been 88-158/56-78. RN reassured patient, but 150 -170  - monitor for few days,if no symtpoms  blood pressure should be less than 140/90 consistently- if blood pressure increases above 140/90 for several days may contact office.

## 2015-02-06 NOTE — Telephone Encounter (Signed)
Mrs. Mabry is calling because she started blood pressure raising medication and she wants to know the perimeters of what it should be and when she should be alarm if its has gone to high.. Please call   Thanks

## 2015-02-06 NOTE — Telephone Encounter (Signed)
Not if BP good and if no longer having spells

## 2015-02-06 NOTE — Telephone Encounter (Signed)
Called patient. She states that she is now on Northera from Dr Rennis Golden and her blood pressure is under better control. She wants to know if she needs to continue to wear the abdominal binder. Please advise.

## 2015-02-20 ENCOUNTER — Telehealth: Payer: Self-pay | Admitting: Internal Medicine

## 2015-02-20 NOTE — Telephone Encounter (Addendum)
Spoke with Elizabeth Mcintosh, they are wanting to know if the patient has reached her maintenance dose of the droxidopa. Spoke with Elizabeth Mcintosh, she has reached 600 mg TID, which is her maintenance dose, Elizabeth Mcintosh made aware. Verbal script given.

## 2015-02-28 ENCOUNTER — Ambulatory Visit (INDEPENDENT_AMBULATORY_CARE_PROVIDER_SITE_OTHER): Payer: Medicare PPO | Admitting: Neurology

## 2015-02-28 ENCOUNTER — Encounter: Payer: Self-pay | Admitting: Neurology

## 2015-02-28 VITALS — BP 120/60 | HR 80 | Ht 68.5 in | Wt 155.0 lb

## 2015-02-28 DIAGNOSIS — I951 Orthostatic hypotension: Secondary | ICD-10-CM | POA: Diagnosis not present

## 2015-02-28 DIAGNOSIS — G232 Striatonigral degeneration: Secondary | ICD-10-CM | POA: Diagnosis not present

## 2015-02-28 NOTE — Progress Notes (Signed)
Elizabeth Mcintosh was seen today in the movement disorders clinic for neurologic consultation at the request of [name],[name], MD.  The consultation is for the evaluation of abnormal gait and "awkwardness" and weakness on the R side.  Pt states that they were away in [place] and [place] in October, 2014 and she felt like she just didn't bounce back after that.  She just felt "slow" after.  In Jan, she went to ortho to get an injection in her shoulder b/c of an impingement syndrome but still felt slow after PT, although she did good in PT.  She tried to d/c all the meds that she thought that she thought would interfere and could cause slowness - benadryl and unisom.  She feels that her right arm doesn't swing.  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  11/22/13 update:  Pt returns today for f/u, accompanied by her daughter who supplements the history.  The patient was diagnosed with Parkinson's disease last visit.  We started levodopa.  The patient reports that she is moving easier.  Her friends noticed that she is more alert.  She did her physical therapies and is planning on starting a cardiovascular exercise program, but has not figured out how exactly to do that or what to do.  She and her daughter bring a long list of questions.  Her daughter asks me about recognition of depression.  The patient denies this, but her daughter states that she does not think that the patient would be able to recognize it, and her daughter thinks that there is some depression.  The patient is very active, especially in regards to travel.  She will be traveling to [place] in July and then will be traveling again overseas in October.  She asks me about refilling her Ambien for insomnia.  Her daughter asks me about drinking alcohol.  Last visit, the patient reported that she drank about 3 or 4 glasses of wine per night, each totaling 6-8 ounces.  No falls.  No hallucinations.  No lightheadedness.    02/22/14  update:  Pt accompanied by her son, who supplements the history.  Pt saw Dr. Conley Canal since last visit.  No evidence of dementia.  Believed had adjustment d/o related to dx of PD and pt agreed to go to counseling with Dr. Conley Canal.  Dr. Conley Canal also discussed with her tapering alcohol use, as I have also.  Pt states that she was told that she could still have 3 drinks per night.   She has increased her carbidopa/levodopa 25/100 to 2 in the AM, 1 in the afternoon, 1 in the evening.  Pt states that she has more energy after going up in the AM.  Her BP was really low today, and she states that it "is always low."  She is sometimes dizzy but it is not bad.  She is getting up in the middle of the night frequently to use the bathroom and cannot sleep.  She wonders if the klonopin contributes to that.  She is exercising more; went 5 days last week.    06/27/14 update:  The patient is following up today.  She is accompanied by her husband who supplements the history.  She is currently on carbidopa/levodopa 25/100, 2 tablets in the morning, one in the afternoon and one in the evening.  Pramipexole was started last visit and worked up to 0.5 g 3 times a day.  She stopped  this and ended up restarting it in November.  She has not noticed a huge difference with this medication.  She did call me in November with an episode of vision change where everything went bright.  It lasted for about 7-10 minutes and she felt somewhat dizzy.  She is not able to have an MRI because of prior aneurysm clips.  A CT of the brain was done and did not reveal any changes.  A carotid ultrasound demonstrated less than 50% stenosis bilaterally.  She was just evaluated for PT/OT/ST on 06/08/2014.  She has been doing a lot of travel - [place], [place], [place] and she did well with a lot of walking.  Her husband states that she did pretty well.  She is working out with a Systems analyst with at [employer].  No falls but feels that she is "lurching"  forward.  Mood is good.  Trouble sleeping.  States that Dr. Timothy Lasso increased her klonopin to 1.5 tablets at night.  Still drinking 2 glasses wine per night but that is down from previously.  10/26/14 update:  The patient is following up today, accompanied by her daughter who supplements the history.  She is on carbidopa/levodopa 25/100, 2 in the morning, one in the afternoon and one in the evening along with pramipexole, 0.5 mg 3 times per day.  She states that she misses one lunchtime pill a week because she forgets. Pt states that she fell 2 days ago.   She was quickly walking into the kitchen and fell into the drying rack in front of the washer and she fell into her shoulder.  No LOC.  Her husband helped her get back up.  Her daughter thinks that she had another fall not long after our last visit; she stepped outside on the ice and went out to get the paper and fell.  She has just finished PT/OT/ST and she states that she learned strategies and she learned exercises.  She has a Systems analyst at the [facility] that she sees once per week and she goes 3 other times throughout the week.  She is on a combination of clonazepam and 3 mg of melatonin for insomnia.  She states that is working well.  She states that her PCP started myrbetriq since last visit and it has helped her bladder.  She does state that she is having cramping of the feet and legs about 3 nights a week.  She continues to drink a glass of wine at lunchtime and 1 "small" glass of liquor in the evening.  She has developed some diplopia and is getting new glasses.  She states that she will not have prisms in the glasses.  12/28/14 update:  The patient is following up today.  She is accompanied by her son and daughter who supplement the history.   She is on carbidopa/levodopa 25/100, 2 tablets in the morning, one in the afternoon and one in the evening in addition to pramipexole, 0.5 mg 3 times per day.  Last visit, I also added carbidopa/levodopa 50/200 at  night because she was complaining about toe curling.  This really helped that problem.   Since our last visit, however, there have been multiple calls from the patient as well as from one of her physician friends complaining about "spells."  These spells will consist of having fragmented vision as if she is looking through a prism and then she feels like she is moving slowly.  She had a CTA of the  head and neck because of these.  It was essentially unremarkable.  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  There was no evidence of recurrent aneurysm.  Her physician friend called me on 11/07/2014 to tell me that he had witnessed these episodes.  The patient complained about bright flashing lights before the episode and then she seemed a little rigid and sunk to the ground.  He was not sure if there was loss of consciousness as she just sunk to the ground, but he did note that when he spoke to her on the ground, she was completely awake and aware.  She had an EEG that did not demonstrate any seizures and then had an ambulatory EEG that just demonstrated temporal delta, without epileptiform activity.  She does have a cardiac consult pending on 01/04/2015.   Her daughter states that a "spell" seems to start with a swaying and then the body seems to be unable to support her.  She seems slow to communicate but is able.  It seems unrelated to dosing timing.  After an episode she seems to need to be in bed all day.  There may be some lightheadedness.  If she sees a prism of light, it lasts a few min and doesn't occur before but during an episode.  Today, they say there is never LOC.  Her face seems white during an episode.  An episode never happens while seated.  An episode is happening in clusters.  A week ago, she had 3 in a day and then this past week she had 1 episode.    Besides for the episodes, her daughter states that they have seen a steep physical decline.  They have seen more  shuffling.  The right leg feels like a "peg leg."   Daughter relates that in June she fell hard on concrete and hit her head and had a very large knot on her posterior occiput and refused to go and get it evaluated.   She is still exercising but hasn't been the last 2 weeks because she hasn't been feeling as well.  She is drinking a glass of wine a day.  She is still on klonopin, 1.5 tablets at night (increased in Jan by Dr. Timothy Lasso).  02/28/15 update:  The patient has a history of multiple system atrophy.  She is accompanied by her husband who supplements the history.    She is on carbidopa/levodopa 25/100, 2 tablets in the morning, one the afternoon and one in the evening as well as pramipexole 0.5 mg 3 times per day.  She had a CT of the brain last visit that was negative.  Last visit, I felt that she was likely having "spells" because of orthostatic hypotension although she was not orthostatic in my office.  I did give her a prescription for an abdominal binder as well as compression stockings.  She is wearing the binder today but couldn't get on the stockings.  She ultimately saw Dr. Rennis Golden and I reviewed his records and am appreciative of his input.  The patient was orthostatic in his office so he felt that a tilt table was not indicated.  He did start her on Northera.  She is now on 600 mg tid.  She ended up in the emergency room on 01/31/2015 at [facility] after a fall.  She did not have a "spell" but she just missed a step.  She missed a step and fell backwards into her  head.  She had another CT of the brain done and there was no evidence of intracerebral bleeding or hemorrhage although there was a moderate sized posterior scalp contusion.  A CT of the spine was done as well and was negative.  They placed staples in her head because of the scalp contusion.  Her blood pressure was apparently significantly elevated initially but it was much lower upon discharge.  She has been taking her BP regularly at home and  it has been low sometimes but not too high.  She has had a few other minor falls.  She is still going to the gym with a trainer 2 times a week and is going two other times a week.  No choking on foods.  Admits to intermittent diplopia with reading.     Neuroimaging has  previously been performed.  It is available for my review today.  CT of the brain was done on 07/30/13 demonstrating encephalomalacia in the R frontal region and prior aneurysm clips.   Pt reports that the aneursym happened when she was pregnant and it ruptured.  PREVIOUS MEDICATIONS: none to date  ALLERGIES:   Allergies  Allergen Reactions  . Penicillins     *childhood allergy*    CURRENT MEDICATIONS:  Current Outpatient Prescriptions on File Prior to Visit  Medication Sig Dispense Refill  . atorvastatin (LIPITOR) 10 MG tablet Take 10 mg by mouth daily.    . bimatoprost (LUMIGAN) 0.01 % SOLN Place 1 drop into both eyes at bedtime.    . carbidopa-levodopa (SINEMET CR) 50-200 MG per tablet Take 1 tablet by mouth at bedtime. 30 tablet 5  . carbidopa-levodopa (SINEMET IR) 25-100 MG per tablet TAKE 2 TABLETS IN THE AM, 1 TABLET AT NOON, 1 IN THE EVENING 120 tablet 5  . clonazePAM (KLONOPIN) 0.5 MG tablet Take 1 tablet (0.5 mg total) by mouth at bedtime. (Patient taking differently: Take 0.75 mg by mouth at bedtime. ) 30 tablet 5  . Droxidopa 100 MG CAPS Take 1 capsule by mouth 3 (three) times daily. (Patient taking differently: Take 6 capsules by mouth 3 (three) times daily. ) 90 capsule 3  . fluticasone (VERAMYST) 27.5 MCG/SPRAY nasal spray Place 2 sprays into the nose daily.    Marland Kitchen glucosamine-chondroitin 500-400 MG tablet Take 1 tablet by mouth daily.    Marland Kitchen LYSINE PO Take by mouth.    . Melatonin 5 MG CAPS Take by mouth at bedtime.    Marland Kitchen MYRBETRIQ 25 MG TB24 tablet     . pramipexole (MIRAPEX) 0.5 MG tablet Take 1 tablet (0.5 mg total) by mouth 3 (three) times daily. 90 tablet 5  . timolol (TIMOPTIC) 0.5 % ophthalmic solution  Place 1 drop into both eyes every morning.     No current facility-administered medications on file prior to visit.    PAST MEDICAL HISTORY:   Past Medical History  Diagnosis Date  . Aneurysm     s/p clips  . Glaucoma   . Parkinson's disease     PAST SURGICAL HISTORY:   Past Surgical History  Procedure Laterality Date  . Craniotomy    . Aneurym clipping      SOCIAL HISTORY:   Social History   Social History  . Marital Status: Married    Spouse Name: N/A  . Number of Children: N/A  . Years of Education: N/A   Occupational History  . retired     Copy, Music therapist   Social History Main Topics  .  Smoking status: Former Games developer  . Smokeless tobacco: Not on file     Comment: quit 40 years ago  . Alcohol Use: 0.0 oz/week    0 Standard drinks or equivalent per week     Comment: 1-2 glasses wine/day (6-8 oz in each glass)  . Drug Use: No  . Sexual Activity: Yes   Other Topics Concern  . Not on file   Social History Narrative    FAMILY HISTORY:   Family Status  Relation Status Death Age  . Mother Deceased 47    fall  . Father Deceased 70    CAD, renal failure  . Brother Deceased     CVA, MI after hip replacement  . Brother Alive     bladder CA  . Child Alive     healthy    ROS:  A complete 10 system review of systems was obtained and was unremarkable apart from what is mentioned above.  PHYSICAL EXAMINATION:    VITALS:   Filed Vitals:   02/28/15 0936  BP: 120/60  Pulse: 80  Height: 5' 8.5" (1.74 m)  Weight: 155 lb (70.308 kg)    GEN:  The patient appears stated age and is in NAD.  More disshelved than previously HEENT:  Normocephalic, atraumatic.  The mucous membranes are moist. The superficial temporal arteries are without ropiness or tenderness. CV:  RRR Lungs:  CTAB.  She does have some inspiratory gasps. Neck/HEME:  There are no carotid bruits bilaterally.  Neurological examination:  Orientation: The patient is alert and oriented x3.  Fund of knowledge is appropriate.  Recent and remote memory are intact.  Attention and concentration are normal.    Able to name objects and repeat phrases. Cranial nerves: There is good facial symmetry.   Extraocular muscles are intact.  There are no square wave jerks.  The visual fields are full to confrontational testing. The speech is fluent and clear. Soft palate rises symmetrically and there is no tongue deviation. Hearing is intact to conversational tone. Sensation: Sensation is intact to light and pinprick throughout (facial, trunk, extremities). Vibration is intact at the bilateral big toe. There is no extinction with double simultaneous stimulation. There is no sensory dermatomal level identified.   Motor: Strength is 5/5 in the bilateral upper and lower extremities.   Shoulder shrug is equal and symmetric.  There is no pronator drift.   Movement examination: Tone: There is mild increased tone in the RUE.  Tone in the left upper extremity is normal today.  Tone in the bilateral lower extremities is normal. Abnormal movements: There is no tremor, even with distraction techniques. Coordination:  There is decremation with RAM's on the right Gait and Station: The patient has no significant difficulty arising out of a deep-seated chair without the use of the hands. The patient's stride length is normal today.  She has decreased arm swing on the right.     ASSESSMENT/PLAN:  1.  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   -pt and husband had multiple questions today re: MSA.  Given pt education handouts re: cure PSP website and answered questions to best of my knowledge.  Much greater than 50% of this visit was spent in counseling with the patient and the family.  Total face to face time:  40 min  - Continue carbidopa/levodopa 25/100 2 tablets in the morning, with one in the afternoon and one in the evening.    -rigidity much improved after mirapex  0.5 mg  tid.  Risks, benefits, side effects and alternative therapies were discussed.  The opportunity to ask questions was given and they were answered to the best of my ability.  The patient expressed understanding and willingness to follow the outlined treatment protocols.  She denies any compulsive behaviors.  -cramping in feet improved after the addition of carbidopa/levodopa 50/200 at night.  However, clinically, she seemed to go downhill not long after that.  It would be hard to believe that it would be because of this medication, but I told her we hold it for now.  She really did not want to do that because the cramping is so much better.  -I stressed again that I really want her to decrease her alcohol intake and would like her to discontinue it altogether. 2.  Spells  -improved after tx for OH.  Doing better on northera, 600 mg tid.  Discussed risks of supine HTN and putting wedge pillow while sleeping or bricks under head of bed.   3.  Urinary frequency  -saw gyn.  Doing better on myrbetriq 4.  Sleep difficulty  -on klonopin and melatonin, 3 mg, and doing better.   Talked about role that EtOH can play in disrupting sleep.  Asked for refill on klonopin but Dr. Timothy Lasso last refilled it at higher dose so deferred to him on this today. 5. Follow up is anticipated in the next few months, sooner should new neurologic issues arise.

## 2015-03-06 ENCOUNTER — Telehealth: Payer: Self-pay | Admitting: Neurology

## 2015-03-06 NOTE — Telephone Encounter (Signed)
PT called and said she needs a handicap sigh for her car/Dawn CB# [phone]

## 2015-03-06 NOTE — Telephone Encounter (Signed)
Paperwork completed and mailed to patient per her request. Patient made aware.

## 2015-03-15 ENCOUNTER — Ambulatory Visit
Admission: RE | Admit: 2015-03-15 | Discharge: 2015-03-15 | Disposition: A | Discharge status: Home / Self Care | Payer: Medicare PPO | Source: Ambulatory Visit | Attending: Neurology | Admitting: Neurology

## 2015-03-15 ENCOUNTER — Telehealth: Payer: Self-pay | Admitting: Neurology

## 2015-03-15 ENCOUNTER — Ambulatory Visit: Payer: Medicare PPO | Admitting: Internal Medicine

## 2015-03-15 DIAGNOSIS — W19XXXA Unspecified fall, initial encounter: Secondary | ICD-10-CM

## 2015-03-15 DIAGNOSIS — S0990XA Unspecified injury of head, initial encounter: Secondary | ICD-10-CM

## 2015-03-15 NOTE — Telephone Encounter (Signed)
Patient's friend called - states they were leaving the house for an appt and patient fell backwards and hit her head on the hard floor. Patient has no symptoms currently. She is asking to have a scan of her head, she does not want to go to the ER. Per Dr Tat okay to order STAT CT head without contrast. Order entered into EPIC. Patient made aware to go to [facility]. She was made aware if she develops any symptoms at all that she will need evaluation by the ER or urgent care. She expressed understanding.

## 2015-03-22 ENCOUNTER — Telehealth: Payer: Self-pay | Admitting: Neurology

## 2015-03-22 ENCOUNTER — Encounter (HOSPITAL_COMMUNITY): Payer: Self-pay | Admitting: General Practice

## 2015-03-22 ENCOUNTER — Emergency Department (HOSPITAL_COMMUNITY)
Admission: EM | Admit: 2015-03-22 | Discharge: 2015-03-22 | Disposition: A | Discharge status: Home / Self Care | Payer: Medicare PPO | Attending: Emergency Medicine | Admitting: Emergency Medicine

## 2015-03-22 DIAGNOSIS — Z79899 Other long term (current) drug therapy: Secondary | ICD-10-CM | POA: Diagnosis not present

## 2015-03-22 DIAGNOSIS — H409 Unspecified glaucoma: Secondary | ICD-10-CM | POA: Insufficient documentation

## 2015-03-22 DIAGNOSIS — R55 Syncope and collapse: Secondary | ICD-10-CM | POA: Diagnosis not present

## 2015-03-22 DIAGNOSIS — Z880 Allergy status to penicillin: Secondary | ICD-10-CM | POA: Diagnosis not present

## 2015-03-22 DIAGNOSIS — Z7951 Long term (current) use of inhaled steroids: Secondary | ICD-10-CM | POA: Insufficient documentation

## 2015-03-22 DIAGNOSIS — Z87891 Personal history of nicotine dependence: Secondary | ICD-10-CM | POA: Diagnosis not present

## 2015-03-22 DIAGNOSIS — G20 Parkinson's disease: Secondary | ICD-10-CM | POA: Diagnosis not present

## 2015-03-22 DIAGNOSIS — Z8679 Personal history of other diseases of the circulatory system: Secondary | ICD-10-CM | POA: Diagnosis not present

## 2015-03-22 DIAGNOSIS — I959 Hypotension, unspecified: Secondary | ICD-10-CM | POA: Insufficient documentation

## 2015-03-22 HISTORY — DX: Sleep apnea, unspecified: G47.30

## 2015-03-22 LAB — CBC WITH DIFFERENTIAL/PLATELET
BASOS ABS: 0 10*3/uL (ref 0.0–0.1)
BASOS PCT: 0 %
EOS ABS: 0.1 10*3/uL (ref 0.0–0.7)
EOS PCT: 2 %
HCT: 42.1 % (ref 36.0–46.0)
Hemoglobin: 14.1 g/dL (ref 12.0–15.0)
LYMPHS ABS: 0.9 10*3/uL (ref 0.7–4.0)
Lymphocytes Relative: 17 %
MCH: 30.9 pg (ref 26.0–34.0)
MCHC: 33.5 g/dL (ref 30.0–36.0)
MCV: 92.1 fL (ref 78.0–100.0)
Monocytes Absolute: 0.2 10*3/uL (ref 0.1–1.0)
Monocytes Relative: 5 %
Neutro Abs: 4 10*3/uL (ref 1.7–7.7)
Neutrophils Relative %: 76 %
PLATELETS: 197 10*3/uL (ref 150–400)
RBC: 4.57 MIL/uL (ref 3.87–5.11)
RDW: 12.6 % (ref 11.5–15.5)
WBC: 5.3 10*3/uL (ref 4.0–10.5)

## 2015-03-22 LAB — I-STAT CHEM 8, ED
BUN: 22 mg/dL — AB (ref 6–20)
CHLORIDE: 99 mmol/L — AB (ref 101–111)
CREATININE: 0.8 mg/dL (ref 0.44–1.00)
Calcium, Ion: 1.16 mmol/L (ref 1.13–1.30)
Glucose, Bld: 128 mg/dL — ABNORMAL HIGH (ref 65–99)
HEMATOCRIT: 46 % (ref 36.0–46.0)
Hemoglobin: 15.6 g/dL — ABNORMAL HIGH (ref 12.0–15.0)
POTASSIUM: 4 mmol/L (ref 3.5–5.1)
Sodium: 138 mmol/L (ref 135–145)
TCO2: 25 mmol/L (ref 0–100)

## 2015-03-22 LAB — BASIC METABOLIC PANEL
Anion gap: 11 (ref 5–15)
BUN: 19 mg/dL (ref 6–20)
CALCIUM: 9.9 mg/dL (ref 8.9–10.3)
CO2: 28 mmol/L (ref 22–32)
CREATININE: 0.75 mg/dL (ref 0.44–1.00)
Chloride: 100 mmol/L — ABNORMAL LOW (ref 101–111)
GFR calc Af Amer: >60 mL/min (ref >60)
Glucose, Bld: 122 mg/dL — ABNORMAL HIGH (ref 65–99)
Potassium: 3.9 mmol/L (ref 3.5–5.1)
SODIUM: 139 mmol/L (ref 135–145)

## 2015-03-22 LAB — TROPONIN I

## 2015-03-22 LAB — ETHANOL: Alcohol, Ethyl (B): <5 mg/dL (ref <5)

## 2015-03-22 MED ORDER — SODIUM CHLORIDE 0.9 % IV BOLUS (SEPSIS): 1000.0000 mL | Freq: Once | INTRAVENOUS | Status: DC

## 2015-03-22 MED ORDER — SODIUM CHLORIDE 0.9 % IV BOLUS (SEPSIS)
500.0000 mL | Freq: Once | INTRAVENOUS | Status: AC
  Administered 2015-03-22: 500 mL via INTRAVENOUS

## 2015-03-22 NOTE — ED Notes (Signed)
Pt presents to the ED after having 2 vagal episodes. Pt had one occurrence at her physical this morning, and again at lunch. Pt has a history of hypotension, and vagal episodes. Pt reports usually only having 1 episode per week, not twice in one day. When EMS arrived they noticed pt was clammy, and pale. Initial B/P 97/42. Pt is taking an experimental medication for hypotension called droxydopa. Pt reports having one glass of wine today.

## 2015-03-22 NOTE — ED Notes (Signed)
Pt waiting for discharged papers. Ambulated without trouble. Pt denied dizziness or pain.

## 2015-03-22 NOTE — ED Provider Notes (Signed)
CSN: [id number]     Arrival date & time 03/22/15  1415 History   First MD Initiated Contact with Patient 03/22/15 1518     Chief Complaint  Patient presents with  . Hypotension     (Consider location/radiation/quality/duration/timing/severity/associated sxs/prior Treatment) HPI Patient had near syncopal event while sitting at lunch today at a restaurant. She had one glass of wine while at lunch. She reports that she suffers near syncopal events every few months, similar to this. EMS was called. No treatment prior to coming here she is presently asymptomatic. No headache no chest pain or abdominal pain. No other associated symptoms no lightheadedness. Blood pressure taken by EMS was 97/42. Further history from Dr. Timothy Lasso who saw this patient this morning for routine visit she has known orthostatic hypotension and blood pressures run 60 systolic with any exertion. This is a long-standing problem. Past Medical History  Diagnosis Date  . Aneurysm (HCC)     s/p clips  . Glaucoma   . Parkinson's disease (HCC)   . Sleep apnea    orthostatic hypotension Past Surgical History  Procedure Laterality Date  . Craniotomy    . Aneurym clipping     Family History  Problem Relation Age of Onset  . Dementia Mother   . Heart attack Father   . Kidney failure Father   . Stroke Brother   . Cancer Brother     Prostate  . Diabetes Brother   . Aneurysm Brother    Social History  Substance Use Topics  . Smoking status: Former Games developer  . Smokeless tobacco: None     Comment: quit 40 years ago  . Alcohol Use: 4.2 oz/week    0 Standard drinks or equivalent, 7 Glasses of wine per week     Comment: 1-2 glasses wine/day (6-8 oz in each glass)   OB History    Gravida Para Term Preterm AB TAB SAB Ectopic Multiple Living   Review of Systems  Constitutional: Negative.   HENT: Negative.   Respiratory: Negative.   Cardiovascular: Negative.   Gastrointestinal: Negative.   Skin:  Negative.   Neurological: Positive for light-headedness.  Psychiatric/Behavioral: Negative.   All other systems reviewed and are negative.     Allergies  Penicillins  Home Medications   Prior to Admission medications   Medication Sig Start Date End Date Taking? Authorizing Provider  atorvastatin (LIPITOR) 10 MG tablet Take 10 mg by mouth daily. 01/02/15   Historical Provider, MD  bimatoprost (LUMIGAN) 0.01 % SOLN Place 1 drop into both eyes at bedtime.    Historical Provider, MD  carbidopa-levodopa (SINEMET CR) 50-200 MG per tablet Take 1 tablet by mouth at bedtime. 10/26/14   Rebecca S Tat, DO  carbidopa-levodopa (SINEMET IR) 25-100 MG per tablet TAKE 2 TABLETS IN THE AM, 1 TABLET AT NOON, 1 IN THE EVENING 01/22/15   Octaviano Batty Tat, DO  clonazePAM (KLONOPIN) 0.5 MG tablet Take 1 tablet (0.5 mg total) by mouth at bedtime. Patient taking differently: Take 0.75 mg by mouth at bedtime.  11/22/13   Rebecca S Tat, DO  Droxidopa 100 MG CAPS Take 1 capsule by mouth 3 (three) times daily. Patient taking differently: Take 6 capsules by mouth 3 (three) times daily.  01/04/15   Chrystie Nose, MD  fluticasone (VERAMYST) 27.5 MCG/SPRAY nasal spray Place 2 sprays into the nose daily.    Historical Provider, MD  glucosamine-chondroitin 500-400 MG  tablet Take 1 tablet by mouth daily.    Historical Provider, MD  LYSINE PO Take by mouth.    Historical Provider, MD  Melatonin 5 MG CAPS Take by mouth at bedtime.    Historical Provider, MD  MYRBETRIQ 25 MG TB24 tablet  10/19/14   Historical Provider, MD  pramipexole (MIRAPEX) 0.5 MG tablet Take 1 tablet (0.5 mg total) by mouth 3 (three) times daily. 12/28/14   Octaviano Batty Tat, DO  timolol (TIMOPTIC) 0.5 % ophthalmic solution Place 1 drop into both eyes every morning.    Historical Provider, MD   BP 179/76 mmHg  Pulse 81  Temp(Src) 97.6 F (36.4 C) (Oral)  Resp 16  Ht 5' 8.5" (1.74 m)  Wt 148 lb (67.132 kg)  BMI 22.17 kg/m2  SpO2 100% Physical Exam   Constitutional: She is oriented to person, place, and time. She appears well-developed and well-nourished.  HENT:  Head: Normocephalic and atraumatic.  Eyes: Conjunctivae are normal. Pupils are equal, round, and reactive to light.  Neck: Neck supple. No tracheal deviation present. No thyromegaly present.  Cardiovascular: Normal rate and regular rhythm.   No murmur heard. Pulmonary/Chest: Effort normal and breath sounds normal.  Abdominal: Soft. Bowel sounds are normal. She exhibits no distension. There is no tenderness.  Musculoskeletal: Normal range of motion. She exhibits no edema or tenderness.  Neurological: She is alert and oriented to person, place, and time. Coordination normal.  Walks unassisted, minimally lightheaded upon standing.  Skin: Skin is warm and dry. No rash noted.  Psychiatric: She has a normal mood and affect.  Nursing note and vitals reviewed.   ED Course  Procedures (including critical care time) Labs Review Labs Reviewed  CBC WITH DIFFERENTIAL/PLATELET  BASIC METABOLIC PANEL  TROPONIN I    Imaging Review No results found. I have personally reviewed and evaluated these images and lab results as part of my medical decision-making.   EKG Interpretation   Date/Time:  Thursday March 22 2015 14:20:16 EDT Ventricular Rate:  80 PR Interval:  153 QRS Duration: 102 QT Interval:  386 QTC Calculation: 445 R Axis:   48 Text Interpretation:  Sinus rhythm Consider right atrial enlargement  Abnormal R-wave progression, early transition Confirmed by COOK  MD, BRIAN  ([id number]) on 03/22/2015 2:57:38 PM     5:45 PM patient asymptomatic after treatment with intravenous fluid bolus. Results for orders placed or performed during the hospital encounter of 03/22/15  Ethanol  Result Value Ref Range   Alcohol, Ethyl (B) <5 <5 mg/dL  CBC with Differential/Platelet  Result Value Ref Range   WBC 5.3 4.0 - 10.5 K/uL   RBC 4.57 3.87 - 5.11 MIL/uL   Hemoglobin 14.1 12.0  - 15.0 g/dL   HCT 16.1 09.6 - 04.5 %   MCV 92.1 78.0 - 100.0 fL   MCH 30.9 26.0 - 34.0 pg   MCHC 33.5 30.0 - 36.0 g/dL   RDW 40.9 81.1 - 91.4 %   Platelets 197 150 - 400 K/uL   Neutrophils Relative % 76 %   Neutro Abs 4.0 1.7 - 7.7 K/uL   Lymphocytes Relative 17 %   Lymphs Abs 0.9 0.7 - 4.0 K/uL   Monocytes Relative 5 %   Monocytes Absolute 0.2 0.1 - 1.0 K/uL   Eosinophils Relative 2 %   Eosinophils Absolute 0.1 0.0 - 0.7 K/uL   Basophils Relative 0 %   Basophils Absolute 0.0 0.0 - 0.1 K/uL  I-stat chem 8, ed  Result Value Ref  Range   Sodium 138 135 - 145 mmol/L   Potassium 4.0 3.5 - 5.1 mmol/L   Chloride 99 (L) 101 - 111 mmol/L   BUN 22 (H) 6 - 20 mg/dL   Creatinine, Ser 4.090.80 0.44 - 1.00 mg/dL   Glucose, Bld 811128 (H) 65 - 99 mg/dL   Calcium, Ion 9.141.16 7.821.13 - 1.30 mmol/L   TCO2 25 0 - 100 mmol/L   Hemoglobin 15.6 (H) 12.0 - 15.0 g/dL   HCT 95.646.0 21.336.0 - 08.646.0 %   Ct Head Wo Contrast  03/15/2015  CLINICAL DATA:  Pain following fall EXAM: CT HEAD WITHOUT CONTRAST TECHNIQUE: Contiguous axial images were obtained from the base of the skull through the vertex without intravenous contrast. COMPARISON:  December 28, 2014 FINDINGS: The patient has had a previous right frontal craniotomy. This area appears stable compared to prior study. 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 Encephalomalacia is also noted in the posterior inferior right frontal lobe near aneurysm clips. A mild degree of encephalomalacia in the medial right temporal lobe in this area is noted as well. The ventricles are prominent but stable. The sulci appear within normal limits. There are clips in the suprasellar region, stable. There is no mass, acute hemorrhage, or extra-axial fluid collection. No midline shift. No new gray-white compartment lesions. No new calvarial defects. Mastoid air cells are clear bilaterally.  IMPRESSION: Extensive postoperative change with areas of encephalomalacia and prior infarction. No new gray-white compartment lesions. No acute infarct evident. No hemorrhage or mass effect. No extra-axial fluid collections. Electronically Signed   By: Bretta BangWilliam  Woodruff III M.D.   On: 03/15/2015 10:34    MDM  I spoke with Dr. Timothy Lassousso. This is a long-standing problem. No further interventions needed. Dr.Tat neurology will call her in short order to decide whether patient requires Florinef. Patient instructed that she should avoid alcohol. Near syncopal event is secondary to orthostatic hypotension, long-standing Final diagnoses:  None   diagnosis. Near syncope      Elizabeth SouSam Louvenia Golomb, MD 03/22/15 1750

## 2015-03-22 NOTE — Discharge Instructions (Signed)
Near-Syncope Avoid alcohol. Dr.Tat will contact you to arrange for an office visit and possible adjustment of your medications. If you don't hear from her by tomorrow, call her office to schedule the next available appointment. Return if your condition worsens for any reason Near-syncope (commonly known as near fainting) is sudden weakness, dizziness, or feeling like you might pass out. During an episode of near-syncope, you may also develop pale skin, have tunnel vision, or feel sick to your stomach (nauseous). Near-syncope may occur when getting up after sitting or while standing for a long time. It is caused by a sudden decrease in blood flow to the brain. This decrease can result from various causes or triggers, most of which are not serious. However, because near-syncope can sometimes be a sign of something serious, a medical evaluation is required. The specific cause is often not determined. HOME CARE INSTRUCTIONS  Monitor your condition for any changes. The following actions may help to alleviate any discomfort you are experiencing:  Have someone stay with you until you feel stable.  Lie down right away and prop your feet up if you start feeling like you might faint. Breathe deeply and steadily. Wait until all the symptoms have passed. Most of these episodes last only a few minutes. You may feel tired for several hours.   Drink enough fluids to keep your urine clear or pale yellow.   If you are taking blood pressure or heart medicine, get up slowly when seated or lying down. Take several minutes to sit and then stand. This can reduce dizziness.  Follow up with your health care provider as directed. SEEK IMMEDIATE MEDICAL CARE IF:   You have a severe headache.   You have unusual pain in the chest, abdomen, or back.   You are bleeding from the mouth or rectum, or you have black or tarry stool.   You have an irregular or very fast heartbeat.   You have repeated fainting or have  seizure-like jerking during an episode.   You faint when sitting or lying down.   You have confusion.   You have difficulty walking.   You have severe weakness.   You have vision problems.  MAKE SURE YOU:   Understand these instructions.  Will watch your condition.  Will get help right away if you are not doing well or get worse.   This information is not intended to replace advice given to you by your health care provider. Make sure you discuss any questions you have with your health care provider.   Document Released: 05/26/2005 Document Revised: 05/31/2013 Document Reviewed: 10/29/2012 Elsevier Interactive Patient Education [employer]2016 [employer]

## 2015-03-22 NOTE — Telephone Encounter (Signed)
Spoke with Dr. Timothy Lassousso. He saw pt in the office today and made her DNR.  She had BP of 60/40's in the office.  After some discussion, told to go home and drink plenty of fluids.  Ended up passing out at home.  Is in ER currently.  He was asking what else we can do.  Told him that in study over 25% of patients on northera were also on florinef so that could be an option as well and I'm happy to address with her but she also may need to permanently just be wheelchair bound if that doesn't help.  Noted that pt has upcoming appt with Dr. Rennis GoldenHilty on 10/21.  She doesn't have one with me anytime soon so will make one.  He also mentioned that pt/family wanted 2nd opinion given end of life issues.  I have no objection to that and can arrange at her next appt. Discussed pts EtOH intake as well and he said that pt admitted that she had already had a drink today.  Lesly RubensteinJade, will you arrange an appt with me.  Dr. Rennis GoldenHilty, Lorain ChildesFYI..Marland Kitchen

## 2015-03-23 ENCOUNTER — Telehealth: Payer: Self-pay | Admitting: Internal Medicine

## 2015-03-23 NOTE — Telephone Encounter (Signed)
03/23/2015 [facility]2015 [facility] Dr. Rennis GoldenHilty.  [facility]  cbr

## 2015-03-23 NOTE — Telephone Encounter (Signed)
Appt made with patient.  

## 2015-03-28 ENCOUNTER — Ambulatory Visit: Payer: Medicare PPO | Admitting: Internal Medicine

## 2015-03-30 ENCOUNTER — Telehealth: Payer: Self-pay | Admitting: Neurology

## 2015-03-30 ENCOUNTER — Ambulatory Visit (INDEPENDENT_AMBULATORY_CARE_PROVIDER_SITE_OTHER): Payer: Medicare PPO | Admitting: Neurology

## 2015-03-30 ENCOUNTER — Encounter: Payer: Self-pay | Admitting: Neurology

## 2015-03-30 ENCOUNTER — Ambulatory Visit (INDEPENDENT_AMBULATORY_CARE_PROVIDER_SITE_OTHER): Payer: Medicare PPO | Admitting: Internal Medicine

## 2015-03-30 VITALS — Ht 68.5 in | Wt 151.0 lb

## 2015-03-30 VITALS — BP 139/78 | HR 81 | Ht 68.5 in | Wt 150.2 lb

## 2015-03-30 DIAGNOSIS — F32A Depression, unspecified: Secondary | ICD-10-CM

## 2015-03-30 DIAGNOSIS — G20 Parkinson's disease: Secondary | ICD-10-CM

## 2015-03-30 DIAGNOSIS — F329 Major depressive disorder, single episode, unspecified: Secondary | ICD-10-CM

## 2015-03-30 DIAGNOSIS — R55 Syncope and collapse: Secondary | ICD-10-CM

## 2015-03-30 DIAGNOSIS — Z515 Encounter for palliative care: Secondary | ICD-10-CM

## 2015-03-30 DIAGNOSIS — I951 Orthostatic hypotension: Secondary | ICD-10-CM | POA: Diagnosis not present

## 2015-03-30 DIAGNOSIS — G232 Striatonigral degeneration: Secondary | ICD-10-CM

## 2015-03-30 MED ORDER — PRAMIPEXOLE DIHYDROCHLORIDE 0.25 MG PO TABS: 0.2500 mg | ORAL_TABLET | Freq: Three times a day (TID) | ORAL | Status: DC

## 2015-03-30 MED ORDER — AMBULATORY NON FORMULARY MEDICATION: 1.0000 | Freq: Every day | Status: DC

## 2015-03-30 NOTE — Progress Notes (Signed)
Elizabeth Mcintosh was seen today in the movement disorders clinic for neurologic consultation at the request of [name],[name], MD.  The consultation is for the evaluation of abnormal gait and "awkwardness" and weakness on the R side.  Pt states that they were away in [place] and [place] in October, 2014 and she felt like she just didn't bounce back after that.  She just felt "slow" after.  In Jan, she went to ortho to get an injection in her shoulder b/c of an impingement syndrome but still felt slow after PT, although she did good in PT.  She tried to d/c all the meds that she thought that she thought would interfere and could cause slowness - benadryl and unisom.  She feels that her right arm doesn't swing.  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  11/22/13 update:  Pt returns today for f/u, accompanied by her daughter who supplements the history.  The patient was diagnosed with Parkinson's disease last visit.  We started levodopa.  The patient reports that she is moving easier.  Her friends noticed that she is more alert.  She did her physical therapies and is planning on starting a cardiovascular exercise program, but has not figured out how exactly to do that or what to do.  She and her daughter bring a long list of questions.  Her daughter asks me about recognition of depression.  The patient denies this, but her daughter states that she does not think that the patient would be able to recognize it, and her daughter thinks that there is some depression.  The patient is very active, especially in regards to travel.  She will be traveling to [place] in July and then will be traveling again overseas in October.  She asks me about refilling her Ambien for insomnia.  Her daughter asks me about drinking alcohol.  Last visit, the patient reported that she drank about 3 or 4 glasses of wine per night, each totaling 6-8 ounces.  No falls.  No hallucinations.  No lightheadedness.    02/22/14  update:  Pt accompanied by her son, who supplements the history.  Pt saw Dr. Conley Canal since last visit.  No evidence of dementia.  Believed had adjustment d/o related to dx of PD and pt agreed to go to counseling with Dr. Conley Canal.  Dr. Conley Canal also discussed with her tapering alcohol use, as I have also.  Pt states that she was told that she could still have 3 drinks per night.   She has increased her carbidopa/levodopa 25/100 to 2 in the AM, 1 in the afternoon, 1 in the evening.  Pt states that she has more energy after going up in the AM.  Her BP was really low today, and she states that it "is always low."  She is sometimes dizzy but it is not bad.  She is getting up in the middle of the night frequently to use the bathroom and cannot sleep.  She wonders if the klonopin contributes to that.  She is exercising more; went 5 days last week.    06/27/14 update:  The patient is following up today.  She is accompanied by her husband who supplements the history.  She is currently on carbidopa/levodopa 25/100, 2 tablets in the morning, one in the afternoon and one in the evening.  Pramipexole was started last visit and worked up to 0.5 g 3 times a day.  She stopped  this and ended up restarting it in November.  She has not noticed a huge difference with this medication.  She did call me in November with an episode of vision change where everything went bright.  It lasted for about 7-10 minutes and she felt somewhat dizzy.  She is not able to have an MRI because of prior aneurysm clips.  A CT of the brain was done and did not reveal any changes.  A carotid ultrasound demonstrated less than 50% stenosis bilaterally.  She was just evaluated for PT/OT/ST on 06/08/2014.  She has been doing a lot of travel - [place], [place], [place] and she did well with a lot of walking.  Her husband states that she did pretty well.  She is working out with a Systems analyst with at [employer].  No falls but feels that she is "lurching"  forward.  Mood is good.  Trouble sleeping.  States that Dr. Timothy Lasso increased her klonopin to 1.5 tablets at night.  Still drinking 2 glasses wine per night but that is down from previously.  10/26/14 update:  The patient is following up today, accompanied by her daughter who supplements the history.  She is on carbidopa/levodopa 25/100, 2 in the morning, one in the afternoon and one in the evening along with pramipexole, 0.5 mg 3 times per day.  She states that she misses one lunchtime pill a week because she forgets. Pt states that she fell 2 days ago.   She was quickly walking into the kitchen and fell into the drying rack in front of the washer and she fell into her shoulder.  No LOC.  Her husband helped her get back up.  Her daughter thinks that she had another fall not long after our last visit; she stepped outside on the ice and went out to get the paper and fell.  She has just finished PT/OT/ST and she states that she learned strategies and she learned exercises.  She has a Systems analyst at the [facility] that she sees once per week and she goes 3 other times throughout the week.  She is on a combination of clonazepam and 3 mg of melatonin for insomnia.  She states that is working well.  She states that her PCP started myrbetriq since last visit and it has helped her bladder.  She does state that she is having cramping of the feet and legs about 3 nights a week.  She continues to drink a glass of wine at lunchtime and 1 "small" glass of liquor in the evening.  She has developed some diplopia and is getting new glasses.  She states that she will not have prisms in the glasses.  12/28/14 update:  The patient is following up today.  She is accompanied by her son and daughter who supplement the history.   She is on carbidopa/levodopa 25/100, 2 tablets in the morning, one in the afternoon and one in the evening in addition to pramipexole, 0.5 mg 3 times per day.  Last visit, I also added carbidopa/levodopa 50/200 at  night because she was complaining about toe curling.  This really helped that problem.   Since our last visit, however, there have been multiple calls from the patient as well as from one of her physician friends complaining about "spells."  These spells will consist of having fragmented vision as if she is looking through a prism and then she feels like she is moving slowly.  She had a CTA of the  head and neck because of these.  It was essentially unremarkable.  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  There was no evidence of recurrent aneurysm.  Her physician friend called me on 11/07/2014 to tell me that he had witnessed these episodes.  The patient complained about bright flashing lights before the episode and then she seemed a little rigid and sunk to the ground.  He was not sure if there was loss of consciousness as she just sunk to the ground, but he did note that when he spoke to her on the ground, she was completely awake and aware.  She had an EEG that did not demonstrate any seizures and then had an ambulatory EEG that just demonstrated temporal delta, without epileptiform activity.  She does have a cardiac consult pending on 01/04/2015.   Her daughter states that a "spell" seems to start with a swaying and then the body seems to be unable to support her.  She seems slow to communicate but is able.  It seems unrelated to dosing timing.  After an episode she seems to need to be in bed all day.  There may be some lightheadedness.  If she sees a prism of light, it lasts a few min and doesn't occur before but during an episode.  Today, they say there is never LOC.  Her face seems white during an episode.  An episode never happens while seated.  An episode is happening in clusters.  A week ago, she had 3 in a day and then this past week she had 1 episode.    Besides for the episodes, her daughter states that they have seen a steep physical decline.  They have seen more  shuffling.  The right leg feels like a "peg leg."   Daughter relates that in June she fell hard on concrete and hit her head and had a very large knot on her posterior occiput and refused to go and get it evaluated.   She is still exercising but hasn't been the last 2 weeks because she hasn't been feeling as well.  She is drinking a glass of wine a day.  She is still on klonopin, 1.5 tablets at night (increased in Jan by Dr. Timothy Lasso).  02/28/15 update:  The patient has a history of multiple system atrophy.  She is accompanied by her husband who supplements the history.    She is on carbidopa/levodopa 25/100, 2 tablets in the morning, one the afternoon and one in the evening as well as pramipexole 0.5 mg 3 times per day.  She had a CT of the brain last visit that was negative.  Last visit, I felt that she was likely having "spells" because of orthostatic hypotension although she was not orthostatic in my office.  I did give her a prescription for an abdominal binder as well as compression stockings.  She is wearing the binder today but couldn't get on the stockings.  She ultimately saw Dr. Rennis Golden and I reviewed his records and am appreciative of his input.  The patient was orthostatic in his office so he felt that a tilt table was not indicated.  He did start her on Northera.  She is now on 600 mg tid.  She ended up in the emergency room on 01/31/2015 at [facility] after a fall.  She did not have a "spell" but she just missed a step.  She missed a step and fell backwards into her  head.  She had another CT of the brain done and there was no evidence of intracerebral bleeding or hemorrhage although there was a moderate sized posterior scalp contusion.  A CT of the spine was done as well and was negative.  They placed staples in her head because of the scalp contusion.  Her blood pressure was apparently significantly elevated initially but it was much lower upon discharge.  She has been taking her BP regularly at home and  it has been low sometimes but not too high.  She has had a few other minor falls.  She is still going to the gym with a trainer 2 times a week and is going two other times a week.  No choking on foods.  Admits to intermittent diplopia with reading.    03/30/15 update:  The patient is following up today, accompanied by her husband who supplements the history.  I have spoken to her primary care physician over the telephone about her as well.  She is on carbidopa/levodopa 25/100, 2 tablets in the morning, one in the afternoon and one in the evening.  She is on pramipexole 0.5 mg, one tablet tid.  I asked her to hold her carbidopa/levodopa 50/200 at bedtime last visit but she didn't do that and is still on that.  Unfortunately, since our last visit she has had multiple falls (states more than she can count) and then a syncopal episode on October 13.  She had a CT of the brain after fall in October 6 that was nonacute.  She had a normal follow-up with her primary care physician on October 13 and her blood pressure was very low with systolic in the 60s.  She was told to go home and hydrate, but unfortunately she ended up going home and having a syncopal episode.  In the emergency room, she was given fluids and her blood pressures were actually quite high, as high as 181/80.  No low blood pressures were recorded in the emergency room as far as I can tell.  She was told by her primary care physician, the emergency room and myself to discontinue alcohol.  States that she hasn't had a drink since that ER visit.  She remains on Northera, 600 mg 3 times per day.  States that she was started on florinef earlier this week and husband feels that she is much better.  States that she was having fainting spells 2 times a day and none since starting the meds (not all sound like syncope and some perhaps near syncope).  Her PCP reports that pt decided on DNR and LOC orders filled out with him.  Husband just bought her a walker at  [employer] and he asks me about WC.  [facility]   Neuroimaging has  previously been performed.  It is available for my review today.  CT of the brain was done on 07/30/13 demonstrating encephalomalacia in the R frontal region and prior aneurysm clips.   Pt reports that the aneursym happened when she was pregnant and it ruptured.  PREVIOUS MEDICATIONS: none to date  ALLERGIES:   Allergies  Allergen Reactions  . Penicillins     *childhood allergy*    CURRENT MEDICATIONS:  Current Outpatient Prescriptions on File Prior to Visit  Medication Sig Dispense Refill  . atorvastatin (LIPITOR) 10 MG tablet Take 10 mg by mouth daily.    . bimatoprost (LUMIGAN) 0.01 % SOLN Place 1 drop into both eyes at bedtime.    .Marland Kitchen  carbidopa-levodopa (SINEMET CR) 50-200 MG per tablet Take 1 tablet by mouth at bedtime. 30 tablet 5  . carbidopa-levodopa (SINEMET IR) 25-100 MG per tablet TAKE 2 TABLETS IN THE AM, 1 TABLET AT NOON, 1 IN THE EVENING 120 tablet 5  . clonazePAM (KLONOPIN) 0.5 MG tablet Take 1 tablet (0.5 mg total) by mouth at bedtime. (Patient taking differently: Take 0.75 mg by mouth at bedtime. ) 30 tablet 5  . Droxidopa 100 MG CAPS Take 1 capsule by mouth 3 (three) times daily. (Patient taking differently: Take 6 capsules by mouth 3 (three) times daily. ) 90 capsule 3  . fluticasone (VERAMYST) 27.5 MCG/SPRAY nasal spray Place 2 sprays into the nose daily.    Marland Kitchen glucosamine-chondroitin 500-400 MG tablet Take 1 tablet by mouth daily.    Marland Kitchen LYSINE PO Take by mouth.    . Melatonin 5 MG CAPS Take by mouth at bedtime.    Marland Kitchen MYRBETRIQ 25 MG TB24 tablet Take 25 mg by mouth daily.     . pramipexole (MIRAPEX) 0.5 MG tablet Take 1 tablet (0.5 mg total) by mouth 3 (three) times daily. 90 tablet 5   No current facility-administered medications on file prior to visit.    PAST MEDICAL HISTORY:   Past Medical History  Diagnosis Date  . Aneurysm (HCC)     s/p clips  . Glaucoma   .  Parkinson's disease (HCC)   . Sleep apnea     PAST SURGICAL HISTORY:   Past Surgical History  Procedure Laterality Date  . Craniotomy    . Aneurym clipping      SOCIAL HISTORY:   Social History   Social History  . Marital Status: Married    Spouse Name: N/A  . Number of Children: N/A  . Years of Education: N/A   Occupational History  . retired     Copy, Music therapist   Social History Main Topics  . Smoking status: Former Games developer  . Smokeless tobacco: Not on file     Comment: quit 40 years ago  . Alcohol Use: 4.2 oz/week    0 Standard drinks or equivalent, 7 Glasses of wine per week     Comment: 1-2 glasses wine/day (6-8 oz in each glass)  . Drug Use: No  . Sexual Activity: Yes   Other Topics Concern  . Not on file   Social History Narrative    FAMILY HISTORY:   Family Status  Relation Status Death Age  . Mother Deceased 73    fall  . Father Deceased 61    CAD, renal failure  . Brother Deceased     CVA, MI after hip replacement  . Brother Alive     bladder CA  . Child Alive     healthy    ROS:  A complete 10 system review of systems was obtained and was unremarkable apart from what is mentioned above.  PHYSICAL EXAMINATION:    VITALS:   Filed Vitals:   03/30/15 0848  Height: 5' 8.5" (1.74 m)  Weight: 151 lb (68.493 kg)   Orthostatic VS for the past 24 hrs:  BP- Lying Pulse- Lying BP- Sitting Pulse- Sitting BP- Standing at 0 minutes Pulse- Standing at 0 minutes  03/30/15 0854 112/70 mmHg 79 96/56 mmHg 78 (!) 84/52 mmHg 89      GEN:  The patient appears stated age and is in NAD.   HEENT:  Normocephalic, atraumatic.  The mucous membranes are moist. The superficial temporal arteries are  without ropiness or tenderness. CV:  RRR Lungs:  CTAB.  She does have some inspiratory gasps. Neck/HEME:  There are no carotid bruits bilaterally.  Neurological examination:  Orientation: The patient is alert and oriented x3. Fund of knowledge is  appropriate.  Recent and remote memory are intact.  Attention and concentration are normal.    Able to name objects and repeat phrases. Cranial nerves: There is good facial symmetry.   Extraocular muscles are intact.  There are no square wave jerks.  The visual fields are full to confrontational testing. The speech is fluent and clear. Soft palate rises symmetrically and there is no tongue deviation. Hearing is intact to conversational tone. Sensation: Sensation is intact to light and pinprick throughout (facial, trunk, extremities). Vibration is intact at the bilateral big toe. There is no extinction with double simultaneous stimulation. There is no sensory dermatomal level identified.   Motor: Strength is 5/5 in the bilateral upper and lower extremities.   Shoulder shrug is equal and symmetric.  There is no pronator drift.   Movement examination: Tone: There is normal tone today in the UE/LE Abnormal movements: There is no tremor, even with distraction techniques. Coordination:  There is decremation with RAM's on the right Gait and Station: not tested due to significant orthostasis today    ASSESSMENT/PLAN:  1.  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   -pt and husband had multiple questions today re: MSA.  Asked about prognosis, future planning.  End of life care issues discussed  - Continue carbidopa/levodopa 25/100 2 tablets in the morning, with one in the afternoon and one in the evening.    -rigidity much improved after mirapex  0.5 mg tid. This medication could be contributing to [facility]. Decided to drop that to 0.25 mg tid given significant orthostasis and syncope/near sycope.  Risks, benefits, side effects and alternative therapies were discussed.  The opportunity to ask questions was given and they were answered to the best of my ability.  The patient expressed understanding and willingness to follow the outlined treatment protocols.  She denies any  compulsive behaviors.  -told her to hold carbidopa/levodopa 50/200 last time but she didn't because it helps nighttime cramping.   Continue for now  -Pt has not had EtOH for a week and encouraged her to stay away from it.    -The patient and her family would like a second opinion.  I certainly have no objection to that.  Would like to go [facility] and will supply that.    -Talked to her about her home situation.  Her bedroom is upstairs and I really don't want her walking up those stairs.  Would like them to try to find alternative (said have sunroom downstairs that they could turn into bedroom) 2.  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   -Long talk with the patient today.  I told her that about 27% of patients in the study with Northera were also on Florinef.  Apparently, Dr. Rennis Golden just added this medication although she was still very orthostatic in the office.    She has an appointment later this afternoon with Dr. Rennis Golden who kindly has been managing much of her orthostatic hypotension, so we opted to have her discuss this in detail with him as well.  I did tell the patient that there comes a time, regardless of orthostatic hypotension, that many of my MSA patients just have to sit and can  no longer walk.  I really think that this time is coming and perhaps we are already there given the number of falls.  States that she is going to try a walker first since she has not tried it previously.   Told her traditional walker won't help because she falls backwards.  Talked to her about the merry walker and showed her it and while the patient seemed open to that, her husband was resistant to that.  Did give them RX for it.  -Asked about transport chair.  After some discussion, her husband said that he would just buy this.  Told them happy to send [facility] company to the house to evaluate their doorways and her needs but they want to hold on that.  Will let me know if they change  their mind. 3.  Urinary frequency  -saw gyn.  Doing better on myrbetriq 4.  Sleep difficulty  -on klonopin and melatonin, 3 mg, and doing better.  Dr. Timothy Lasso refilling klonopin now. 5. Follow up is anticipated in the next few months, sooner should new neurologic issues arise.  Much greater than 50% of this visit was spent in counseling with the patient and the family.  Total face to face time:  45 min

## 2015-03-30 NOTE — Patient Instructions (Signed)
Your physician recommends that you schedule a follow-up appointment in 6 weeks with Dr Hilty. 

## 2015-03-30 NOTE — Patient Instructions (Signed)
1. Prescription given for Elizabeth Mcintosh 2. Decrease Mirapex to 0.25 mg three times daily. You can split your current pills in half until they are gone. A new prescription has been sent to [facility].  3. We will make a referral to [facility] for a second opinion. If you do not hear from them please call [phone].

## 2015-03-30 NOTE — Telephone Encounter (Signed)
Referral and records faxed to [facility] for second opinion to [phone] with confirmation received. They will contact patient directly with appt.

## 2015-04-01 ENCOUNTER — Encounter: Payer: Self-pay | Admitting: Internal Medicine

## 2015-04-01 NOTE — Progress Notes (Signed)
OFFICE NOTE  Chief Complaint:  Follow-up postural hypotension  Primary Care Physician: Gwen PoundsUSSO,[name], MD  HPI:  Elizabeth Mcintosh is a pleasant 69 year old female kindly referred to me by Dr. Arbutus Leasat with neurology for evaluation of postural hypotension and autonomic dysfunction. Mrs. 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 Unfortunately she has autonomic involvement and his had problems with hypotension and dizziness as well as positional presyncopal symptoms. She's been prescribed a lower extremity compression stockings in an abdominal binder and there is discussion about whether or not we need to start medications in order to help with blood pressure. She's been found to be orthostatic in the past and blood pressure today is actually quite low at 84/59 in the office. She's never had a true syncopal event however that is likely to happen in the near future. She denies any chest pain or worsening shortness of breath. She's never had an echocardiogram.  I saw Mrs. Korb back in the office today. She underwent an echocardiogram which shows normal LV systolic function and mild diastolic dysfunction. This is reassuring that her orthostatic hypotension is not related to a significant cardiomyopathy. I do believe is related to autonomic dysfunction. We talked about options for management and I feel that she may be a good candidate for the relatively new medicine Northera (droxidopa).   Mrs. 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 She is accompanied by her daughter who [place] in [place]. She did see her neurologist earlier this morning. To this point, she has been placed on Droxidopa and the dose has been titrated up to 600 mg 3 times a day. This is max dose therapy and should provide the most benefit. We have seen a favorable response in blood pressure. At the last office visit we were almost unable to get a blood pressure at rest in a seated  position, but today her blood pressure was 139/78. Unfortunately, she continues to have some syncopal episodes which her daughter feels are more frequent, but the episode should be less likely given the increase in blood pressure. She just recently got compression stockings and is starting to wear them. In addition she saw her primary care provider who recommended adding fludrocortisone to her current regimen. I agree with this therapy and the dose could be uptitrated to 0.2 and even 0.3 mg. She was also advised to work on adequate hydration and liberalize salt in her diet. There was also discussion between her and her primary care provider about advanced directives. I think this is an appropriate discussion although it does not necessarily mean end-of-life care, she should consider the fact that this disease seems to be progressing and that mortality is of fairly high to my understanding.  PMHx:  Past Medical History  Diagnosis Date  . Aneurysm (HCC)     s/p clips  . Glaucoma   . Parkinson's disease (HCC)   . Sleep apnea     Past Surgical History  Procedure Laterality Date  . Craniotomy    . Aneurym clipping      FAMHx:  Family History  Problem Relation Age of Onset  . Dementia Mother   . Heart attack Father   . Kidney failure Father   . Stroke Brother   . Cancer Brother     Prostate  . Diabetes Brother   . Aneurysm Brother     SOCHx:   reports that she has quit smoking. She does not have any smokeless  tobacco history on file. She reports that she drinks about 4.2 oz of alcohol per week. She reports that she does not use illicit drugs.  ALLERGIES:  Allergies  Allergen Reactions  . Penicillins     *childhood allergy*    ROS: A comprehensive review of systems was negative except for: Neurological: positive for dizziness and Parkinsonism  HOME MEDS: Current Outpatient Prescriptions  Medication Sig Dispense Refill  . AMBULATORY NON FORMULARY MEDICATION 1 Device by Does not  apply route daily. Merry Walker 1 Device 0  . atorvastatin (LIPITOR) 10 MG tablet Take 10 mg by mouth daily.    . bimatoprost (LUMIGAN) 0.01 % SOLN Place 1 drop into both eyes at bedtime.    . carbidopa-levodopa (SINEMET CR) 50-200 MG per tablet Take 1 tablet by mouth at bedtime. 30 tablet 5  . carbidopa-levodopa (SINEMET IR) 25-100 MG per tablet TAKE 2 TABLETS IN THE AM, 1 TABLET AT NOON, 1 IN THE EVENING 120 tablet 5  . clonazePAM (KLONOPIN) 0.5 MG tablet Take 1.5 tablets by mouth daily.  5  . Droxidopa 200 MG CAPS Take 2 tablets by mouth 3 (three) times daily.    . fludrocortisone (FLORINEF) 0.1 MG tablet Take 100 mcg by mouth daily.  5  . fluticasone (VERAMYST) 27.5 MCG/SPRAY nasal spray Place 2 sprays into the nose daily.    Marland Kitchen glucosamine-chondroitin 500-400 MG tablet Take 1 tablet by mouth daily.    Marland Kitchen LYSINE PO Take by mouth.    . Melatonin 5 MG CAPS Take by mouth at bedtime.    Marland Kitchen MYRBETRIQ 25 MG TB24 tablet Take 25 mg by mouth daily.     . pramipexole (MIRAPEX) 0.25 MG tablet Take 1 tablet (0.25 mg total) by mouth 3 (three) times daily. 90 tablet 2   No current facility-administered medications for this visit.    LABS/IMAGING: No results found for this or any previous visit (from the past 48 hour(s)). No results found.  WEIGHTS: Wt Readings from Last 3 Encounters:  03/30/15 150 lb 3.2 oz (68.13 kg)  03/30/15 151 lb (68.493 kg)  03/22/15 148 lb (67.132 kg)    VITALS: BP 139/78 mmHg  Pulse 81  Ht 5' 8.5" (1.74 m)  Wt 150 lb 3.2 oz (68.13 kg)  BMI 22.50 kg/m2  EXAM: Deferred  EKG: Deferred  ASSESSMENT: 1. Autonomic postural hypotension 2. 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 Depression 4. Syncope  PLAN: 1.   Mrs. Deihl I feel has at least partially responded to the addition of droxidopa, although is still having episodes of syncope. Her daughter is concerned about the increase in these episodes and whether that is related to worsening of the  disease. The 2 may be correlated. Blood pressure is actually much improved and I think there is little room for additional medication. I do agree with the addition of Florinef which should help with intravascular volume. She should remain hydrated and liberalize salt in her diet. She should also continue wearing the abdominal binders and lower extremity compression stockings. With her blood pressure that is high normal at baseline now, there probably is little benefit from adding an alpha agonist such as midodrine to her regimen at this time. I'll plan to see her back in a month or 2 for close follow-up.  Chrystie Nose, MD, [facility] Attending Cardiologist [facility]  Chrystie Nose 04/01/2015, 12:58 PM

## 2015-04-16 ENCOUNTER — Telehealth: Payer: Self-pay | Admitting: Internal Medicine

## 2015-04-16 NOTE — Telephone Encounter (Signed)
Spoke to patient. She has only 4 days left of Northera.   Elizabeth RaringCalled Northera @ [phone] - was informed that med is on file with up to date prior auth. Her pharmacy is [facility]   [facility] @ [phone] option 2, option 4 -- [facility], patient needs to call to arrange med shipment - phone # [phone]  Patient notified of this.

## 2015-04-16 NOTE — Telephone Encounter (Signed)
Please call,question about her Northera.

## 2015-05-06 ENCOUNTER — Emergency Department (HOSPITAL_COMMUNITY): Payer: Medicare PPO

## 2015-05-06 ENCOUNTER — Emergency Department (HOSPITAL_COMMUNITY)
Admission: EM | Admit: 2015-05-06 | Discharge: 2015-05-06 | Disposition: A | Discharge status: Home / Self Care | Payer: Medicare PPO | Attending: Emergency Medicine | Admitting: Emergency Medicine

## 2015-05-06 ENCOUNTER — Encounter (HOSPITAL_COMMUNITY): Payer: Self-pay | Admitting: *Deleted

## 2015-05-06 DIAGNOSIS — S0101XA Laceration without foreign body of scalp, initial encounter: Secondary | ICD-10-CM | POA: Insufficient documentation

## 2015-05-06 DIAGNOSIS — W01198A Fall on same level from slipping, tripping and stumbling with subsequent striking against other object, initial encounter: Secondary | ICD-10-CM | POA: Diagnosis not present

## 2015-05-06 DIAGNOSIS — Z79899 Other long term (current) drug therapy: Secondary | ICD-10-CM | POA: Diagnosis not present

## 2015-05-06 DIAGNOSIS — G20 Parkinson's disease: Secondary | ICD-10-CM | POA: Insufficient documentation

## 2015-05-06 DIAGNOSIS — Y92008 Other place in unspecified non-institutional (private) residence as the place of occurrence of the external cause: Secondary | ICD-10-CM | POA: Insufficient documentation

## 2015-05-06 DIAGNOSIS — Z7951 Long term (current) use of inhaled steroids: Secondary | ICD-10-CM | POA: Insufficient documentation

## 2015-05-06 DIAGNOSIS — IMO0002 Reserved for concepts with insufficient information to code with codable children: Secondary | ICD-10-CM

## 2015-05-06 DIAGNOSIS — Z8679 Personal history of other diseases of the circulatory system: Secondary | ICD-10-CM | POA: Insufficient documentation

## 2015-05-06 DIAGNOSIS — Z880 Allergy status to penicillin: Secondary | ICD-10-CM | POA: Insufficient documentation

## 2015-05-06 DIAGNOSIS — Y998 Other external cause status: Secondary | ICD-10-CM | POA: Insufficient documentation

## 2015-05-06 DIAGNOSIS — S0181XA Laceration without foreign body of other part of head, initial encounter: Secondary | ICD-10-CM | POA: Diagnosis present

## 2015-05-06 DIAGNOSIS — Z87891 Personal history of nicotine dependence: Secondary | ICD-10-CM | POA: Insufficient documentation

## 2015-05-06 DIAGNOSIS — Y9389 Activity, other specified: Secondary | ICD-10-CM | POA: Diagnosis not present

## 2015-05-06 DIAGNOSIS — H409 Unspecified glaucoma: Secondary | ICD-10-CM | POA: Diagnosis not present

## 2015-05-06 DIAGNOSIS — W19XXXA Unspecified fall, initial encounter: Secondary | ICD-10-CM

## 2015-05-06 HISTORY — DX: Hypotension, unspecified: I95.9

## 2015-05-06 MED ORDER — LIDOCAINE-EPINEPHRINE (PF) 2 %-1:200000 IJ SOLN
20.0000 mL | Freq: Once | INTRAMUSCULAR | Status: AC
  Administered 2015-05-06: 20 mL
  Filled 2015-05-06: qty 20

## 2015-05-06 NOTE — ED Notes (Signed)
Discharge instructions reviewed - voiced understanding 

## 2015-05-06 NOTE — ED Notes (Signed)
Head and laceration cleaned and dressed

## 2015-05-06 NOTE — Discharge Instructions (Signed)
Please read attached information. If you experience any new or worsening signs or symptoms please return to the emergency room for evaluation. Please follow-up with your primary care provider or specialist as discussed. Please use medication prescribed only as directed and discontinue taking if you have any concerning signs or symptoms.   °

## 2015-05-06 NOTE — ED Notes (Signed)
Patient presents from the waiting area with pressure dressing to head.  States she fell while going up the steps earlier today.  Denies LOC.  Patient alert and oriented.  Answers questions appropriately.

## 2015-05-06 NOTE — ED Notes (Signed)
The pt is c/o pain in the back of her head.  She tripped and fell going up steps  Today.    No loc.  The laceration is still bleeding.  Pressure bandage placed.  Alert oriented

## 2015-05-06 NOTE — ED Provider Notes (Signed)
CSN: [id number]     Arrival date & time 05/06/15  1931 History   First MD Initiated Contact with Patient 05/06/15 2020     Chief Complaint  Patient presents with  . Head Laceration    HPI   69 year old female presents today status post fall. Patient reports that she was walking up steps outside of her house when she tripped falling backwards landing on her butt and striking the back of her head. Patient denies any loss of consciousness, was able to get back to her feet with the assistance of her husband. Patient reports she's had several falls over the last several months, due to "clumsiness". Patient has a history of Parkinson's disease which makes coordination difficult at times. Patient reports bleeding to the posterior scalp that was controlled with direct pressure. She denies headache, but reports pain to the posterior aspect of her scalp, she denies neck pain but reports she has a "popping" noise in her neck when she moves it. Patient denies back pain, hip pain, lower extremity pain. She denies any loss of distal sensation strength or motor function, no changes in smell taste in vision. Patient does report baseline weakness on the right lower extremities due to the Parkinson's disease. Other than the bleeding and pain to the posterior scalp patient has no acute concerns today. Husband is present at the time of evaluation reports patient is at her baseline. She denies taking any blood thinners.   Past Medical History  Diagnosis Date  . Aneurysm (HCC)     s/p clips  . Glaucoma   . Parkinson's disease (HCC)   . Sleep apnea   . Hypotension    Past Surgical History  Procedure Laterality Date  . Craniotomy    . Aneurym clipping     Family History  Problem Relation Age of Onset  . Dementia Mother   . Heart attack Father   . Kidney failure Father   . Stroke Brother   . Cancer Brother     Prostate  . Diabetes Brother   . Aneurysm Brother    Social History  Substance Use Topics  .  Smoking status: Former Games developermoker  . Smokeless tobacco: None     Comment: quit 40 years ago  . Alcohol Use: 4.2 oz/week    7 Glasses of wine, 0 Standard drinks or equivalent per week     Comment: 1-2 glasses wine/day (6-8 oz in each glass)   OB History    Gravida Para Term Preterm AB TAB SAB Ectopic Multiple Living   2 2        2      Review of Systems  All other systems reviewed and are negative.   Allergies  Penicillins  Home Medications   Prior to Admission medications   Medication Sig Start Date End Date Taking? Authorizing Provider  AMBULATORY NON FORMULARY MEDICATION 1 Device by Does not apply route daily. Les PouMerry Walker 03/30/15   Rebecca S Tat, DO  atorvastatin (LIPITOR) 10 MG tablet Take 10 mg by mouth daily. 01/02/15   Historical Provider, MD  bimatoprost (LUMIGAN) 0.01 % SOLN Place 1 drop into both eyes at bedtime.    Historical Provider, MD  carbidopa-levodopa (SINEMET CR) 50-200 MG per tablet Take 1 tablet by mouth at bedtime. 10/26/14   Rebecca S Tat, DO  carbidopa-levodopa (SINEMET IR) 25-100 MG per tablet TAKE 2 TABLETS IN THE AM, 1 TABLET AT NOON, 1 IN THE EVENING 01/22/15   Octaviano Battyebecca S Tat, DO  clonazePAM (  KLONOPIN) 0.5 MG tablet Take 1.5 tablets by mouth daily. 03/04/15   Historical Provider, MD  Droxidopa 200 MG CAPS Take 2 tablets by mouth 3 (three) times daily.    Historical Provider, MD  fludrocortisone (FLORINEF) 0.1 MG tablet Take 100 mcg by mouth daily. 03/23/15   Historical Provider, MD  fluticasone (VERAMYST) 27.5 MCG/SPRAY nasal spray Place 2 sprays into the nose daily.    Historical Provider, MD  glucosamine-chondroitin 500-400 MG tablet Take 1 tablet by mouth daily.    Historical Provider, MD  LYSINE PO Take by mouth.    Historical Provider, MD  Melatonin 5 MG CAPS Take by mouth at bedtime.    Historical Provider, MD  MYRBETRIQ 25 MG TB24 tablet Take 25 mg by mouth daily.  10/19/14   Historical Provider, MD  pramipexole (MIRAPEX) 0.25 MG tablet Take 1 tablet (0.25  mg total) by mouth 3 (three) times daily. 03/30/15   Rebecca S Tat, DO   BP 175/85 mmHg  Pulse 84  Temp(Src) 97.9 F (36.6 C) (Oral)  Resp 16  SpO2 95%   Physical Exam  Constitutional: She is oriented to person, place, and time. She appears well-developed and well-nourished.  HENT:  Head: Normocephalic and atraumatic.  1.5 cm laceration to the posterior scalp. Surrounding hematoma, no deformity of the skull noted with palpation.  Eyes: Conjunctivae are normal. Pupils are equal, round, and reactive to light. Right eye exhibits no discharge. Left eye exhibits no discharge. No scleral icterus.  Neck: Normal range of motion. No JVD present. No tracheal deviation present.  Neck is supple with full active range of motion, nontender to palpation. Patient reports "popping" sensation to the neck with range of motion  Pulmonary/Chest: Effort normal. No stridor.  Neurological: She is alert and oriented to person, place, and time. She has normal strength. No cranial nerve deficit or sensory deficit. Gait abnormal. Coordination normal. GCS eye subscore is 4. GCS verbal subscore is 5. GCS motor subscore is 6.  Reflex Scores:      Patellar reflexes are 2+ on the right side and 2+ on the left side. Psychiatric: She has a normal mood and affect. Her behavior is normal. Judgment and thought content normal.  Nursing note and vitals reviewed.   ED Course  Procedures (including critical care time)  LACERATION REPAIR Performed by: Thermon Leyland Authorized by: Thermon Leyland Consent: Verbal consent obtained. Risks and benefits: risks, benefits and alternatives were discussed Consent given by: patient Patient identity confirmed: provided demographic data Prepped and Draped in normal sterile fashion Wound explored  Laceration Location: Posterior scalp  Laceration Length: 1.5 cm  No Foreign Bodies seen or palpated  Anesthesia: local infiltration  Local anesthetic: lidocaine 2% % 1  epinephrine  Anesthetic total: 4 mL ml  Irrigation method: syringe Amount of cleaning: standard  Skin closure: Simple   Number of sutures: 3   Technique: Simple interrupted using rapid Vicryl   Patient tolerance: Patient tolerated the procedure well with no immediate complications. Labs Review Labs Reviewed - No data to display  Imaging Review Ct Head Wo Contrast  05/06/2015  CLINICAL DATA:  PMH: Aneurysm 34 years ago. PATIENT FELL BACKWARDS AFTER WALKING UP 4-5 STEPS LAC TO RIGHT POSTERIOR SKULL PATIENT DENIES LOC HEADACHE HX OF ANEURYSM CLIP AND BRAIN SURG EXAM: CT HEAD WITHOUT CONTRAST CT CERVICAL SPINE WITHOUT CONTRAST TECHNIQUE: Multidetector CT imaging of the head and cervical spine was performed following the standard protocol without intravenous contrast. Multiplanar CT image reconstructions of the cervical  spine were also generated. COMPARISON:  03/15/2015 FINDINGS: CT HEAD FINDINGS There are stable postoperative findings including right craniectomy, circle of Willis aneurysm clip, right frontal and anterior right temporal encephalomalacia, and right caudate encephalomalacia. There is no intracranial hemorrhage or extra-axial fluid collection. No acute intracranial findings are evident, and there is no interval change from 03/15/2015. There is a right posterior parietal scalp hematoma. Calvarium and skullbase are intact. CT CERVICAL SPINE FINDINGS The vertebral column, pedicles and facet articulations are intact. There is no evidence of acute fracture. No acute soft tissue abnormalities are evident. There is mild to moderate cervical facet arthritis, greatest on the left from C2 through C6. No bone lesion or bony destruction. IMPRESSION: 1. Negative for acute intracranial traumatic injury. There is prior right craniectomy and aneurysm clipping with associated encephalomalacia, stable. 2. Negative for acute cervical spine fracture. Electronically Signed   By: Ellery Plunk M.D.   On:  05/06/2015 21:16   Ct Cervical Spine Wo Contrast  05/06/2015  CLINICAL DATA:  PMH: Aneurysm 34 years ago. PATIENT FELL BACKWARDS AFTER WALKING UP 4-5 STEPS LAC TO RIGHT POSTERIOR SKULL PATIENT DENIES LOC HEADACHE HX OF ANEURYSM CLIP AND BRAIN SURG EXAM: CT HEAD WITHOUT CONTRAST CT CERVICAL SPINE WITHOUT CONTRAST TECHNIQUE: Multidetector CT imaging of the head and cervical spine was performed following the standard protocol without intravenous contrast. Multiplanar CT image reconstructions of the cervical spine were also generated. COMPARISON:  03/15/2015 FINDINGS: CT HEAD FINDINGS There are stable postoperative findings including right craniectomy, circle of Willis aneurysm clip, right frontal and anterior right temporal encephalomalacia, and right caudate encephalomalacia. There is no intracranial hemorrhage or extra-axial fluid collection. No acute intracranial findings are evident, and there is no interval change from 03/15/2015. There is a right posterior parietal scalp hematoma. Calvarium and skullbase are intact. CT CERVICAL SPINE FINDINGS The vertebral column, pedicles and facet articulations are intact. There is no evidence of acute fracture. No acute soft tissue abnormalities are evident. There is mild to moderate cervical facet arthritis, greatest on the left from C2 through C6. No bone lesion or bony destruction. IMPRESSION: 1. Negative for acute intracranial traumatic injury. There is prior right craniectomy and aneurysm clipping with associated encephalomalacia, stable. 2. Negative for acute cervical spine fracture. Electronically Signed   By: Ellery Plunk M.D.   On: 05/06/2015 21:16   I have personally reviewed and evaluated these images and lab results as part of my medical decision-making.   EKG Interpretation None      MDM   Final diagnoses:  Fall, initial encounter  Laceration    Labs:  Imaging: CT head without contrast, CT neck without contrast-  no acute  findings  Consults:  Therapeutics:  Discharge Meds:   Assessment/Plan: 69 year old female presents today with a laceration to the back of her head status post fall. No bony abnormalities, no significant findings on CT evaluation. Patient has no neurological deficits, no loss of consciousness, only pain to the back of the scalp at this time. Patient was ambulated without difficulty, simple repair performed by myself with good wound closure. Patient was given wound care instructions, strict return precautions, truck did follow up with primary care for reevaluation if signs of infection present. Both the patient and her husband verbalized understanding and agreement to today's plan and had no further questions or concerns at time of discharge.  Patient's care was shared with Dr. Clayborne Dana today personally evaluated the patient and agreed to my assessment and plan  Eyvonne Mechanic, PA-C 05/06/15 2314  Marily Memos, MD 05/08/15 [phone]

## 2015-05-06 NOTE — ED Provider Notes (Signed)
Medical screening examination/treatment/procedure(s) were conducted as a shared visit with non-physician practitioner(s) and myself.  I personally evaluated the patient during the encounter.  Mechanical fall with laceration to posterior scalp. No other complaints. Cardiac, Pulmonary exams normal. She had no ttp in C spine on my exam, nor did she have ttp or rom of other extremities. Baseline neurologic function per patient and husband. Plan to clean and repair lac. CT head and C spine negative on my interpretation, will await radiology official reads for disposition.    EKG Interpretation None        Marily MemosJason Darnell Stimson, MD 05/08/15 1217

## 2015-05-11 ENCOUNTER — Ambulatory Visit (INDEPENDENT_AMBULATORY_CARE_PROVIDER_SITE_OTHER): Payer: Medicare PPO | Admitting: Internal Medicine

## 2015-05-11 ENCOUNTER — Encounter: Payer: Self-pay | Admitting: Internal Medicine

## 2015-05-11 VITALS — BP 101/64 | HR 79 | Ht 68.5 in | Wt 153.5 lb

## 2015-05-11 DIAGNOSIS — F329 Major depressive disorder, single episode, unspecified: Secondary | ICD-10-CM

## 2015-05-11 DIAGNOSIS — I951 Orthostatic hypotension: Secondary | ICD-10-CM

## 2015-05-11 DIAGNOSIS — G20 Parkinson's disease: Secondary | ICD-10-CM | POA: Diagnosis not present

## 2015-05-11 DIAGNOSIS — F32A Depression, unspecified: Secondary | ICD-10-CM

## 2015-05-11 NOTE — Progress Notes (Signed)
OFFICE NOTE  Chief Complaint:  Follow-up postural hypotension  Primary Care Physician: Gwen Pounds, MD  HPI:  JOBINA MAITA is a pleasant 69 year old female kindly referred to me by Dr. Arbutus Leas with neurology for evaluation of postural hypotension and autonomic dysfunction. Mrs. 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 Unfortunately she has autonomic involvement and his had problems with hypotension and dizziness as well as positional presyncopal symptoms. She's been prescribed a lower extremity compression stockings in an abdominal binder and there is discussion about whether or not we need to start medications in order to help with blood pressure. She's been found to be orthostatic in the past and blood pressure today is actually quite low at 84/59 in the office. She's never had a true syncopal event however that is likely to happen in the near future. She denies any chest pain or worsening shortness of breath. She's never had an echocardiogram.  I saw Mrs. Granholm back in the office today. She underwent an echocardiogram which shows normal LV systolic function and mild diastolic dysfunction. This is reassuring that her orthostatic hypotension is not related to a significant cardiomyopathy. I do believe is related to autonomic dysfunction. We talked about options for management and I feel that she may be a good candidate for the relatively new medicine Northera (droxidopa).   Mrs. 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 She is accompanied by her daughter who lives in [place]. She did see her neurologist earlier this morning. To this point, she has been placed on Droxidopa and the dose has been titrated up to 600 mg 3 times a day. This is max dose therapy and should provide the most benefit. We have seen a favorable response in blood pressure. At the last office visit we were almost unable to get a blood pressure at rest in a seated  position, but today her blood pressure was 139/78. Unfortunately, she continues to have some syncopal episodes which her daughter feels are more frequent, but the episode should be less likely given the increase in blood pressure. She just recently got compression stockings and is starting to wear them. In addition she saw her primary care provider who recommended adding fludrocortisone to her current regimen. I agree with this therapy and the dose could be uptitrated to 0.2 and even 0.3 mg. She was also advised to work on adequate hydration and liberalize salt in her diet. There was also discussion between her and her primary care provider about advanced directives. I think this is an appropriate discussion although it does not necessarily mean end-of-life care, she should consider the fact that this disease seems to be progressing and that mortality is of fairly high to my understanding.  I the pleasure of seeing Mrs. Ziomek back today in the office. She is accompanied by her husband. She recently had a fall for which she was taken to the emergency department and had to have sutures. This was reportedly a "mechanical fall". While walking up stairs. It may have to do with her abnormal gait. The family is noted that she's had significant improvement in her symptoms with the addition of fludrocortisone and Droxidopa. She has not had any more syncopal episodes. She continues to have significant swings in blood pressure with some blood pressures in the 90s systolic and other blood pressures as high as 200. She seems to be asymptomatic with this.  PMHx:  Past Medical History  Diagnosis Date  .  Aneurysm (HCC)     s/p clips  . Glaucoma   . Parkinson's disease (HCC)   . Sleep apnea   . Hypotension     Past Surgical History  Procedure Laterality Date  . Craniotomy    . Aneurym clipping      FAMHx:  Family History  Problem Relation Age of Onset  . Dementia Mother   . Heart attack Father   . Kidney  failure Father   . Stroke Brother   . Cancer Brother     Prostate  . Diabetes Brother   . Aneurysm Brother     SOCHx:   reports that she has quit smoking. She does not have any smokeless tobacco history on file. She reports that she drinks about 4.2 oz of alcohol per week. She reports that she does not use illicit drugs.  ALLERGIES:  Allergies  Allergen Reactions  . Penicillins     *childhood allergy*    ROS: A comprehensive review of systems was negative except for: Neurological: positive for dizziness and Parkinsonism  HOME MEDS: Current Outpatient Prescriptions  Medication Sig Dispense Refill  . AMBULATORY NON FORMULARY MEDICATION 1 Device by Does not apply route daily. Merry Walker 1 Device 0  . bimatoprost (LUMIGAN) 0.01 % SOLN Place 1 drop into both eyes at bedtime.    . carbidopa-levodopa (SINEMET CR) 50-200 MG per tablet Take 1 tablet by mouth at bedtime. 30 tablet 5  . carbidopa-levodopa (SINEMET IR) 25-100 MG per tablet TAKE 2 TABLETS IN THE AM, 1 TABLET AT NOON, 1 IN THE EVENING 120 tablet 5  . clonazePAM (KLONOPIN) 0.5 MG tablet Take 1.5 tablets by mouth daily.  5  . Droxidopa 200 MG CAPS Take 2 tablets by mouth 3 (three) times daily.    . fludrocortisone (FLORINEF) 0.1 MG tablet Take 0.1 mg by mouth daily. Take 1 & 1/2 tablets daily.  5  . fluticasone (VERAMYST) 27.5 MCG/SPRAY nasal spray Place 2 sprays into the nose daily.    Marland Kitchen glucosamine-chondroitin 500-400 MG tablet Take 1 tablet by mouth daily.    Marland Kitchen LYSINE PO Take by mouth.    . Melatonin 5 MG CAPS Take by mouth at bedtime.    Marland Kitchen MYRBETRIQ 25 MG TB24 tablet Take 25 mg by mouth daily.     . pramipexole (MIRAPEX) 0.25 MG tablet Take 1 tablet (0.25 mg total) by mouth 3 (three) times daily. 90 tablet 2   No current facility-administered medications for this visit.    LABS/IMAGING: No results found for this or any previous visit (from the past 48 hour(s)). No results found.  WEIGHTS: Wt Readings from Last 3  Encounters:  05/11/15 153 lb 8 oz (69.627 kg)  03/30/15 150 lb 3.2 oz (68.13 kg)  03/30/15 151 lb (68.493 kg)    VITALS: BP 101/64 mmHg  Pulse 79  Ht 5' 8.5" (1.74 m)  Wt 153 lb 8 oz (69.627 kg)  BMI 23.00 kg/m2  EXAM: Deferred  EKG: Deferred  ASSESSMENT: 1. Autonomic postural hypotension 2. 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 Depression 4. Syncope  PLAN: 1.   Mrs. Forse continues to have labile blood pressures but has had no further syncopal episodes. Although she has some high blood pressures at times, she is asymptomatic with it. In general her average blood pressure is fairly normal and I would recommend we continue her current medications. She has follow-up with her neurologist in January. I'll plan to see her back in 3 months.  Lisette Abu  Rennis GoldenHilty, MD, [facility]ACC Attending Cardiologist [facility]  Lisette AbuKenneth C Rashema Seawright 05/11/2015, 10:02 AM

## 2015-05-11 NOTE — Patient Instructions (Signed)
Medication Instructions:   CONTINUE SAME MEDICATIONS   If you need a refill on your cardiac medications before your next appointment, please call your pharmacy.  Labwork: NONE ORDER TODAY    Testing/Procedures: NONE ORDER TODAY    Follow-Up:    IN 3 MONTHS WITH DR HLTY    Any Other Special Instructions Will Be Listed Below (If Applicable).

## 2015-05-14 ENCOUNTER — Encounter: Payer: Medicare PPO | Admitting: Gynecology

## 2015-05-17 ENCOUNTER — Ambulatory Visit: Payer: Medicare PPO

## 2015-05-17 ENCOUNTER — Ambulatory Visit: Payer: Medicare PPO | Admitting: Physical Therapy

## 2015-05-17 ENCOUNTER — Ambulatory Visit: Payer: Medicare PPO | Admitting: Occupational Therapy

## 2015-05-17 ENCOUNTER — Ambulatory Visit: Payer: Medicare PPO | Attending: Internal Medicine | Admitting: Occupational Therapy

## 2015-05-17 DIAGNOSIS — R269 Unspecified abnormalities of gait and mobility: Secondary | ICD-10-CM | POA: Insufficient documentation

## 2015-05-17 DIAGNOSIS — R279 Unspecified lack of coordination: Secondary | ICD-10-CM | POA: Insufficient documentation

## 2015-05-17 DIAGNOSIS — R258 Other abnormal involuntary movements: Secondary | ICD-10-CM

## 2015-05-17 DIAGNOSIS — R29898 Other symptoms and signs involving the musculoskeletal system: Secondary | ICD-10-CM

## 2015-05-17 DIAGNOSIS — R471 Dysarthria and anarthria: Secondary | ICD-10-CM | POA: Insufficient documentation

## 2015-05-17 NOTE — Therapy (Signed)
[facility] [address] [place], [place], [address] Phone: [phone]   Fax:  [phone]  Patient Details  Name: Elizabeth Mcintosh MRN: [id number] Date of Birth: 03/21/1946 Referring Provider:  Creola Cornusso, John, MD  Encounter Date: 05/17/2015 Occupational Therapy Parkinson's Disease Screen  Physical Performance Test item #2 (simulated eating):  22.13 secs   Physical Performance Test item #4 (donning/doffing jacket):  14.75 secs  9-hole peg test:    RUE  25.52 secs        LUE  31.50 secs  Change in ability to perform ADLs/IADLs:  Decline in ADLS, pt requires increased assistance with ADLs.   Pt would benefit from occupational therapy evaluation due to  Decline in ADL performance.  Jermani Eberlein 05/17/2015, 11:29 AM Keene BreathKathryn Hollynn Garno, OTR/L [phone] Phone: [phone] 11:29 AM 05/17/2015 [facility] [address] [place], [place], [address] Phone: [phone]   Fax:  [phone]

## 2015-05-17 NOTE — Therapy (Signed)
Physical Therapy Parkinson's Disease Screen   Timed Up and Go test: 10.32 seconds normal; 11.6 seconds manual; 11.78 seconds cognitive  10 meter walk test: 3.01 ft/second (10.91 seconds total)  5 time sit to stand test:13.62 seconds with posterior lean  Elizabeth Mcintosh denies freezing  Elizabeth Mcintosh states 2 falls in the last 6 months and no near falls yet note from Dr Tat states multiple falls ("too many to count").  Above data collected by Elizabeth Mcintosh, PTA  Elizabeth Mcintosh, Elizabeth Mcintosh, has reviewed the above data and compared to previous bout of therapy.  Elizabeth Mcintosh has increased TUG time; Elizabeth Mcintosh has slightly improved 5x sit<>stand time, but with increased posterior lean.  Elizabeth Mcintosh has had several falls in the past 6 months.  Elizabeth Mcintosh would benefit from physical therapy evaluation for tune-up of exercises and to address recent falls.        Elizabeth Mcintosh, [place] [facility] 05/17/2015 10:12 AM Phone: [phone] Fax: [phone]  Reviewed data and Elizabeth Mcintosh recommends physical therapy evaluation at this time.  Elizabeth Mcintosh, Elizabeth Mcintosh Elizabeth Mcintosh, Elizabeth Mcintosh 05/18/2015 8:04 AM Phone: [phone] Fax: [phone]

## 2015-05-17 NOTE — Therapy (Signed)
[facility] [address] [place], [place], [address] Phone: [phone]   Fax:  [phone]  Patient Details  Name: Elizabeth Mcintosh MRN: [id number] Date of Birth: 01/15/1946 Referring Provider:  Kerin Salenat, Rebecca, MD  Encounter Date: 05/17/2015  Speech Therapy Parkinson's Disease Screen  Decibel Level today: 66dB  (WNL=70-72 dB) with sound level meter 30cm away from pt's mouth. She exhibits reduced breath support resulting in a usual (approx 70% of the time) harsh/hoarse voice. Pt's conversational volume has decreased since last treatment course.  Pt's affect is less bright than SLP remembers.  Pt has not experienced difficulty in swallowing warranting objective evaluation at this time.  Pt would benefit from the following speech therapy recommendations: Speech-language eval for dysarthria. Please order via EPIC if agreed   [facility] AscCHINKE,CARL , MS, CCC-SLP  05/17/2015, 8:26 AM  [facility] [address] [place], [place], [address] Phone: [phone]   Fax:  [phone]

## 2015-05-18 ENCOUNTER — Telehealth: Payer: Self-pay | Admitting: Neurology

## 2015-05-18 DIAGNOSIS — G232 Striatonigral degeneration: Secondary | ICD-10-CM

## 2015-05-18 NOTE — Telephone Encounter (Signed)
-----   Message from Colonel BaldKathryn B Rine, OT sent at 05/17/2015  7:27 PM EST ----- Dr. Georgina Quintat,  Elizabeth Mcintosh was seen today for screening by PT,OT, and ST.  Due to a decline in ADL performance, speech volume and recent falls, we are recommending PT,OT, ST evaluations. If you agree please make a referral for PT, OT, ST.  ([name] has requested to wait until January to begin therapy.)  Thanks, Keene BreathKathryn Rine, OTR/L

## 2015-05-18 NOTE — Telephone Encounter (Signed)
Order entered

## 2015-05-30 ENCOUNTER — Other Ambulatory Visit: Payer: Self-pay | Admitting: Neurology

## 2015-05-30 ENCOUNTER — Telehealth: Payer: Self-pay

## 2015-05-30 NOTE — Telephone Encounter (Signed)
Carbidopa Levodopa 50/200 refill requested. Per last office note- patient to remain on medication. Refill approved and sent to patient's pharmacy.   

## 2015-05-30 NOTE — Telephone Encounter (Signed)
Prior auth for [employer] [facility]

## 2015-05-31 ENCOUNTER — Telehealth: Payer: Self-pay | Admitting: Internal Medicine

## 2015-05-31 MED ORDER — DROXIDOPA 300 MG PO CAPS: ORAL_CAPSULE | ORAL | Status: DC

## 2015-05-31 NOTE — Telephone Encounter (Signed)
Pt says she need some form signed before tomorrow so she can get her medicine. Pt says she feels real lightheaded and dizzy.

## 2015-05-31 NOTE — Telephone Encounter (Signed)
Returned call to patient she stated she is almost out of Droxidopa 300 mg she takes 2 tablets three times a day.[facility] [phone] stated she has 11 refills left he will send prescription to patient.

## 2015-06-01 ENCOUNTER — Telehealth: Payer: Self-pay | Admitting: Physician Assistant

## 2015-06-01 ENCOUNTER — Telehealth: Payer: Self-pay

## 2015-06-01 ENCOUNTER — Telehealth: Payer: Self-pay | Admitting: Cardiology

## 2015-06-01 NOTE — Telephone Encounter (Signed)
No answer left message to call back if problems

## 2015-06-01 NOTE — Telephone Encounter (Signed)
Ms. Elizabeth Mcintosh called because she was out of her Sinemet because of a mistake by the compounding pharmacy. She is being started back on it and wanted to know if she needed to go back on her previous dose or if she needed to taper up.  I advised the patient that cardiology did not prescribe this medication, this medication was prescribed by Dr. Arbutus Leasat, the neurologist. I gave her Dr. Don Perkingat's phone number and requested she call the neurologist to asked this question.  Leanna BattlesBarrett, Rhonda, PA-C 06/01/2015 4:26 PM Beeper [phone]

## 2015-06-01 NOTE — Telephone Encounter (Signed)
[facility]ra [facility]30[facility]2017.

## 2015-06-19 ENCOUNTER — Ambulatory Visit: Payer: Medicare Other | Attending: Neurology | Admitting: Physical Therapy

## 2015-06-19 ENCOUNTER — Ambulatory Visit: Payer: Medicare Other

## 2015-06-19 ENCOUNTER — Ambulatory Visit: Payer: Medicare Other | Admitting: Occupational Therapy

## 2015-06-21 ENCOUNTER — Encounter: Payer: Self-pay | Admitting: Neurology

## 2015-06-21 ENCOUNTER — Ambulatory Visit (INDEPENDENT_AMBULATORY_CARE_PROVIDER_SITE_OTHER): Payer: Medicare Other | Admitting: Neurology

## 2015-06-21 VITALS — BP 132/88 | HR 77 | Ht 68.5 in | Wt 149.0 lb

## 2015-06-21 DIAGNOSIS — G4700 Insomnia, unspecified: Secondary | ICD-10-CM

## 2015-06-21 DIAGNOSIS — R55 Syncope and collapse: Secondary | ICD-10-CM | POA: Diagnosis not present

## 2015-06-21 DIAGNOSIS — I951 Orthostatic hypotension: Secondary | ICD-10-CM

## 2015-06-21 DIAGNOSIS — G232 Striatonigral degeneration: Secondary | ICD-10-CM

## 2015-06-21 NOTE — Progress Notes (Signed)
Elizabeth Mcintosh was seen today in the movement disorders clinic for neurologic consultation at the request of ElizabethJOHN M, MD.  The consultation is for the evaluation of abnormal gait and "awkwardness" and weakness on the R side.  Pt states that they were away in [place] and [place] in October, 2014 and she felt like she just didn't bounce back after that.  She just felt "slow" after.  In Jan, she went to ortho to get an injection in her shoulder b/c of an impingement syndrome but still felt slow after PT, although she did good in PT.  She tried to d/c all the meds that she thought that she thought would interfere and could cause slowness - benadryl and unisom.  She feels that her right arm doesn't swing.  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  11/22/13 update:  Pt returns today for f/u, accompanied by her daughter who supplements the history.  The patient was diagnosed with Parkinson's disease last visit.  We started levodopa.  The patient reports that she is moving easier.  Her friends noticed that she is more alert.  She did her physical therapies and is planning on starting a cardiovascular exercise program, but has not figured out how exactly to do that or what to do.  She and her daughter bring a long list of questions.  Her daughter asks me about recognition of depression.  The patient denies this, but her daughter states that she does not think that the patient would be able to recognize it, and her daughter thinks that there is some depression.  The patient is very active, especially in regards to travel.  She will be traveling to [place] in July and then will be traveling again overseas in October.  She asks me about refilling her Ambien for insomnia.  Her daughter asks me about drinking alcohol.  Last visit, the patient reported that she drank about 3 or 4 glasses of wine per night, each totaling 6-8 ounces.  No falls.  No hallucinations.  No lightheadedness.    02/22/14  update:  Pt accompanied by her son, who supplements the history.  Pt saw Dr. Conley Canal since last visit.  No evidence of dementia.  Believed had adjustment d/o related to dx of PD and pt agreed to go to counseling with Dr. Conley Canal.  Dr. Conley Canal also discussed with her tapering alcohol use, as I have also.  Pt states that she was told that she could still have 3 drinks per night.   She has increased her carbidopa/levodopa 25/100 to 2 in the AM, 1 in the afternoon, 1 in the evening.  Pt states that she has more energy after going up in the AM.  Her BP was really low today, and she states that it "is always low."  She is sometimes dizzy but it is not bad.  She is getting up in the middle of the night frequently to use the bathroom and cannot sleep.  She wonders if the klonopin contributes to that.  She is exercising more; went 5 days last week.    06/27/14 update:  The patient is following up today.  She is accompanied by her husband who supplements the history.  She is currently on carbidopa/levodopa 25/100, 2 tablets in the morning, one in the afternoon and one in the evening.  Pramipexole was started last visit and worked up to 0.5 g 3 times a day.  She stopped  this and ended up restarting it in November.  She has not noticed a huge difference with this medication.  She did call me in November with an episode of vision change where everything went bright.  It lasted for about 7-10 minutes and she felt somewhat dizzy.  She is not able to have an MRI because of prior aneurysm clips.  A CT of the brain was done and did not reveal any changes.  A carotid ultrasound demonstrated less than 50% stenosis bilaterally.  She was just evaluated for PT/OT/ST on 06/08/2014.  She has been doing a lot of travel - [place], [place], [place] and she did well with a lot of walking.  Her husband states that she did pretty well.  She is working out with a Systems analyst with at [employer].  No falls but feels that she is "lurching"  forward.  Mood is good.  Trouble sleeping.  States that Dr. Timothy Mcintosh increased her klonopin to 1.5 tablets at night.  Still drinking 2 glasses wine per night but that is down from previously.  10/26/14 update:  The patient is following up today, accompanied by her daughter who supplements the history.  She is on carbidopa/levodopa 25/100, 2 in the morning, one in the afternoon and one in the evening along with pramipexole, 0.5 mg 3 times per day.  She states that she misses one lunchtime pill a week because she forgets. Pt states that she fell 2 days ago.   She was quickly walking into the kitchen and fell into the drying rack in front of the washer and she fell into her shoulder.  No LOC.  Her husband helped her get back up.  Her daughter thinks that she had another fall not long after our last visit; she stepped outside on the ice and went out to get the paper and fell.  She has just finished PT/OT/ST and she states that she learned strategies and she learned exercises.  She has a Systems analyst at the [facility] that she sees once per week and she goes 3 other times throughout the week.  She is on a combination of clonazepam and 3 mg of melatonin for insomnia.  She states that is working well.  She states that her PCP started myrbetriq since last visit and it has helped her bladder.  She does state that she is having cramping of the feet and legs about 3 nights a week.  She continues to drink a glass of wine at lunchtime and 1 "small" glass of liquor in the evening.  She has developed some diplopia and is getting new glasses.  She states that she will not have prisms in the glasses.  12/28/14 update:  The patient is following up today.  She is accompanied by her son and daughter who supplement the history.   She is on carbidopa/levodopa 25/100, 2 tablets in the morning, one in the afternoon and one in the evening in addition to pramipexole, 0.5 mg 3 times per day.  Last visit, I also added carbidopa/levodopa 50/200 at  night because she was complaining about toe curling.  This really helped that problem.   Since our last visit, however, there have been multiple calls from the patient as well as from one of her physician friends complaining about "spells."  These spells will consist of having fragmented vision as if she is looking through a prism and then she feels like she is moving slowly.  She had a CTA of the  head and neck because of these.  It was essentially unremarkable.  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  There was no evidence of recurrent aneurysm.  Her physician friend called me on 11/07/2014 to tell me that he had witnessed these episodes.  The patient complained about bright flashing lights before the episode and then she seemed a little rigid and sunk to the ground.  He was not sure if there was loss of consciousness as she just sunk to the ground, but he did note that when he spoke to her on the ground, she was completely awake and aware.  She had an EEG that did not demonstrate any seizures and then had an ambulatory EEG that just demonstrated temporal delta, without epileptiform activity.  She does have a cardiac consult pending on 01/04/2015.   Her daughter states that a "spell" seems to start with a swaying and then the body seems to be unable to support her.  She seems slow to communicate but is able.  It seems unrelated to dosing timing.  After an episode she seems to need to be in bed all day.  There may be some lightheadedness.  If she sees a prism of light, it lasts a few min and doesn't occur before but during an episode.  Today, they say there is never LOC.  Her face seems white during an episode.  An episode never happens while seated.  An episode is happening in clusters.  A week ago, she had 3 in a day and then this past week she had 1 episode.    Besides for the episodes, her daughter states that they have seen a steep physical decline.  They have seen more  shuffling.  The right leg feels like a "peg leg."   Daughter relates that in June she fell hard on concrete and hit her head and had a very large knot on her posterior occiput and refused to go and get it evaluated.   She is still exercising but hasn't been the last 2 weeks because she hasn't been feeling as well.  She is drinking a glass of wine a day.  She is still on klonopin, 1.5 tablets at night (increased in Jan by Dr. Timothy Mcintosh).  02/28/15 update:  The patient has a history of multiple system atrophy.  She is accompanied by her husband who supplements the history.    She is on carbidopa/levodopa 25/100, 2 tablets in the morning, one the afternoon and one in the evening as well as pramipexole 0.5 mg 3 times per day.  She had a CT of the brain last visit that was negative.  Last visit, I felt that she was likely having "spells" because of orthostatic hypotension although she was not orthostatic in my office.  I did give her a prescription for an abdominal binder as well as compression stockings.  She is wearing the binder today but couldn't get on the stockings.  She ultimately saw Dr. Rennis Golden and I reviewed his records and am appreciative of his input.  The patient was orthostatic in his office so he felt that a tilt table was not indicated.  He did start her on Northera.  She is now on 600 mg tid.  She ended up in the emergency room on 01/31/2015 at [facility] after a fall.  She did not have a "spell" but she just missed a step.  She missed a step and fell backwards into her  head.  She had another CT of the brain done and there was no evidence of intracerebral bleeding or hemorrhage although there was a moderate sized posterior scalp contusion.  A CT of the spine was done as well and was negative.  They placed staples in her head because of the scalp contusion.  Her blood pressure was apparently significantly elevated initially but it was much lower upon discharge.  She has been taking her BP regularly at home and  it has been low sometimes but not too high.  She has had a few other minor falls.  She is still going to the gym with a trainer 2 times a week and is going two other times a week.  No choking on foods.  Admits to intermittent diplopia with reading.    03/30/15 update:  The patient is following up today, accompanied by her husband who supplements the history.  I have spoken to her primary care physician over the telephone about her as well.  She is on carbidopa/levodopa 25/100, 2 tablets in the morning, one in the afternoon and one in the evening.  She is on pramipexole 0.5 mg, one tablet tid.  I asked her to hold her carbidopa/levodopa 50/200 at bedtime last visit but she didn't do that and is still on that.  Unfortunately, since our last visit she has had multiple falls (states more than she can count) and then a syncopal episode on October 13.  She had a CT of the brain after fall in October 6 that was nonacute.  She had a normal follow-up with her primary care physician on October 13 and her blood pressure was very low with systolic in the 60s.  She was told to go home and hydrate, but unfortunately she ended up going home and having a syncopal episode.  In the emergency room, she was given fluids and her blood pressures were actually quite high, as high as 181/80.  No low blood pressures were recorded in the emergency room as far as I can tell.  She was told by her primary care physician, the emergency room and myself to discontinue alcohol.  States that she hasn't had a drink since that ER visit.  She remains on Northera, 600 mg 3 times per day.  States that she was started on florinef earlier this week and husband feels that she is much better.  States that she was having fainting spells 2 times a day and none since starting the meds (not all sound like syncope and some perhaps near syncope).  Her PCP reports that pt decided on DNR and LOC orders filled out with him.  Husband just bought her a walker at  [employer] and he asks me about WC.  [facility] would [facility]  06/21/15 update:  Patient follows up today, accompanied by her husband who supplements history.  I have reviewed prior records made available to me.  The patient is on carbidopa/levodopa 25/100, 2 tablets in the morning, one in the afternoon, one in the evening and carbidopa/levodopa 50/200 at night.  I asked her decrease her pramipexole last visit from 0.5 mg 3 times a day to 0.25 mg 3 times a day because of orthostasis and syncope.  She is not sure if she did that or not.   She continues on northera, 600 mg 3 times per day for this along with florinef 0.1 mg, 1-1/2 tablets daily.  She has done much better with this, and has not had  further syncope.  She did go to the emergency room on 05/06/2015 after a mechanical fall on her stairs.  A ct of the brain was negative.  She has not fallen since then.  She has had some near falls.  She has a PT re-eval upcoming in Feb.   She did ask me last visit for a second opinion at [facility] and has an appointment on 07/09/2015 with a general neurologist at [facility].  Her husband states that a friend of theirs sees a PD specialist at [facility] and has had DBS and thinks that this is the best place to go.  Asks me why I think that she has MSA and not PD.  PREVIOUS MEDICATIONS: none to date  ALLERGIES:   Allergies  Allergen Reactions  . Penicillins     *childhood allergy*    CURRENT MEDICATIONS:  Current Outpatient Prescriptions on File Prior to Visit  Medication Sig Dispense Refill  . AMBULATORY NON FORMULARY MEDICATION 1 Device by Does not apply route daily. Merry Walker 1 Device 0  . bimatoprost (LUMIGAN) 0.01 % SOLN Place 1 drop into both eyes at bedtime.    . carbidopa-levodopa (SINEMET CR) 50-200 MG tablet TAKE 1 TABLET BY MOUTH AT BEDTIME 30 tablet 5  . carbidopa-levodopa (SINEMET IR) 25-100 MG per tablet TAKE 2 TABLETS IN THE AM, 1 TABLET AT NOON, 1 IN THE EVENING 120 tablet 5  . clonazePAM  (KLONOPIN) 0.5 MG tablet Take 1.5 tablets by mouth daily.  5  . Droxidopa 300 MG CAPS Take 2 tablets three times a day 168 capsule 10  . fludrocortisone (FLORINEF) 0.1 MG tablet Take 0.1 mg by mouth daily. Take 1 & 1/2 tablets daily.  5  . fluticasone (VERAMYST) 27.5 MCG/SPRAY nasal spray Place 2 sprays into the nose daily.    Marland Kitchen LYSINE PO Take by mouth.    . Melatonin 5 MG CAPS Take by mouth at bedtime.    Marland Kitchen MYRBETRIQ 25 MG TB24 tablet Take 25 mg by mouth daily.     . pramipexole (MIRAPEX) 0.25 MG tablet Take 1 tablet (0.25 mg total) by mouth 3 (three) times daily. 90 tablet 2   No current facility-administered medications on file prior to visit.    PAST MEDICAL HISTORY:   Past Medical History  Diagnosis Date  . Aneurysm (HCC)     s/p clips  . Glaucoma   . Parkinson's disease (HCC)   . Sleep apnea   . Hypotension     PAST SURGICAL HISTORY:   Past Surgical History  Procedure Laterality Date  . Craniotomy    . Aneurym clipping      SOCIAL HISTORY:   Social History   Social History  . Marital Status: Married    Spouse Name: N/A  . Number of Children: N/A  . Years of Education: N/A   Occupational History  . retired     Copy, Music therapist   Social History Main Topics  . Smoking status: Former Games developer  . Smokeless tobacco: Not on file     Comment: quit 40 years ago  . Alcohol Use: 4.2 oz/week    7 Glasses of wine, 0 Standard drinks or equivalent per week     Comment: 1-2 glasses wine/day (6-8 oz in each glass)  . Drug Use: No  . Sexual Activity: Yes   Other Topics Concern  . Not on file   Social History Narrative    FAMILY HISTORY:   Family Status  Relation Status Death  Age  . Mother Deceased 28    fall  . Father Deceased 80    CAD, renal failure  . Brother Deceased     CVA, MI after hip replacement  . Brother Alive     bladder CA  . Child Alive     healthy    ROS:  A complete 10 system review of systems was obtained and was unremarkable apart  from what is mentioned above.  PHYSICAL EXAMINATION:    VITALS:   Filed Vitals:   06/21/15 1043  BP: 132/88  Pulse: 77  Height: 5' 8.5" (1.74 Mcintosh)  Weight: 149 lb (67.586 kg)   No data found.     GEN:  The patient appears stated age and is in NAD.   HEENT:  Normocephalic, atraumatic.  The mucous membranes are moist. The superficial temporal arteries are without ropiness or tenderness. CV:  RRR Lungs:  CTAB.  She does have some inspiratory gasps. Neck/HEME:  There are no carotid bruits bilaterally.  Neurological examination:  Orientation: The patient is alert and oriented x3. Fund of knowledge is appropriate.  Recent and remote memory are intact.  Attention and concentration are normal.    Able to name objects and repeat phrases. Cranial nerves: There is good facial symmetry.   Extraocular muscles are intact.  There are no square wave jerks.  The visual fields are full to confrontational testing. The speech is fluent and clear. Soft palate rises symmetrically and there is no tongue deviation. Hearing is intact to conversational tone. Sensation: Sensation is intact to light and pinprick throughout (facial, trunk, extremities). Vibration is intact at the bilateral big toe. There is no extinction with double simultaneous stimulation. There is no sensory dermatomal level identified.   Motor: Strength is 5/5 in the bilateral upper and lower extremities.   Shoulder shrug is equal and symmetric.  There is no pronator drift.   Movement examination: Tone: There is mild increased tone in the RUE Abnormal movements: There is no tremor, even with distraction techniques. Coordination:  There is decremation with RAM's on the right Gait and Station: walks slowly down the hall with good stride length and mild gait instability   ASSESSMENT/PLAN:  1.  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   -pt and husband had multiple questions today re: MSA.  They  asked about why I didn't think that she had PD and discussed differences and how she meets criteria for MSA and has had very significant OH early in disease.  - Continue carbidopa/levodopa 25/100 2 tablets in the morning, with one in the afternoon and one in the evening.    -needs to check her overall dosing of pramipexole.  Last visit, I lowered it because of orthostasis and she should be on 0.25 mg 3 times per day.  She is a little bit more rigid today, but that is something that we will need to just except.  -Continue carbidopa/levodopa 50/200 which seems to be helping nighttime cramping  -Pt has enough coming appointment for a second opinion at [facility].  As above, they also asked me about [facility].  I really have no objection to this, but reminded her that the friend that had DBS at [facility] also has Parkinson's disease and she does not.  She would not be a DBS candidate.  -Talked to her again about her home situation.  Her bedroom is upstairs and I really don't want her walking up those stairs.  Would like them to  try to find alternative (said have sunroom downstairs that they could turn into bedroom).  She continues to use her bedroom upstairs and they state that there is no bathtub or shower downstairs, although there is a toilet.  I told them that they really need to think about solutions for the future, especially if she is going to stay in the home.  -She came in today without any type of ambulatory assistive device.  I encouraged her to use at least a walker at all times.  I actually talked to her last visit about a transport chair and she has that at home now, but has not been using it lately. 2.  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  She is also on Florinef, 0.1 mg, 1-1/2 tablets daily.  She seems to be doing well on this combination. 3.  Urinary frequency  -saw gyn.  On Myrbetriq, which she finds intermittently helpful. 4.  Sleep difficulty  -on  klonopin and melatonin, 3 mg, and doing better.  Dr. Timothy Mcintosh refilling klonopin now.  She asked me if we could find an alternative to clonazepam, and I asked her why and she stated because she could not drink alcohol with this.  I told her that I would not want her drinking alcohol regardless. 5. Follow up is anticipated in the next few months, sooner should new neurologic issues arise.  She wanted to space out her visits somewhat, which I have no objection to.  Much greater than 50% of this visit was spent in counseling with the patient and the family.  Total face to face time:  40 min

## 2015-07-03 ENCOUNTER — Ambulatory Visit: Payer: Medicare PPO | Admitting: Neurology

## 2015-07-10 ENCOUNTER — Encounter: Payer: Medicare PPO | Admitting: Occupational Therapy

## 2015-07-10 ENCOUNTER — Ambulatory Visit: Payer: Medicare PPO | Admitting: Physical Therapy

## 2015-07-10 ENCOUNTER — Ambulatory Visit: Payer: Medicare Other

## 2015-07-12 ENCOUNTER — Ambulatory Visit: Payer: Medicare Other | Attending: Neurology | Admitting: Speech Pathology

## 2015-07-12 ENCOUNTER — Ambulatory Visit: Payer: Medicare Other | Admitting: Occupational Therapy

## 2015-07-12 ENCOUNTER — Ambulatory Visit: Payer: Medicare Other | Admitting: Physical Therapy

## 2015-07-12 DIAGNOSIS — R29898 Other symptoms and signs involving the musculoskeletal system: Secondary | ICD-10-CM | POA: Diagnosis present

## 2015-07-12 DIAGNOSIS — R279 Unspecified lack of coordination: Secondary | ICD-10-CM | POA: Diagnosis present

## 2015-07-12 DIAGNOSIS — M2560 Stiffness of unspecified joint, not elsewhere classified: Secondary | ICD-10-CM | POA: Diagnosis present

## 2015-07-12 DIAGNOSIS — R293 Abnormal posture: Secondary | ICD-10-CM

## 2015-07-12 DIAGNOSIS — R269 Unspecified abnormalities of gait and mobility: Secondary | ICD-10-CM | POA: Diagnosis present

## 2015-07-12 DIAGNOSIS — R471 Dysarthria and anarthria: Secondary | ICD-10-CM | POA: Insufficient documentation

## 2015-07-12 DIAGNOSIS — R258 Other abnormal involuntary movements: Secondary | ICD-10-CM | POA: Insufficient documentation

## 2015-07-12 DIAGNOSIS — R2681 Unsteadiness on feet: Secondary | ICD-10-CM | POA: Diagnosis present

## 2015-07-12 DIAGNOSIS — R2689 Other abnormalities of gait and mobility: Secondary | ICD-10-CM

## 2015-07-12 NOTE — Patient Instructions (Signed)
   Loud AH!!! 5x twice a day - Big Breath first!  Read sentences aloud at effort of 6/10 - big breath before each sentence  Practice big breaths in for 5 count, out for 5 count

## 2015-07-12 NOTE — Therapy (Signed)
[facility] [address] [place], [place], [address] Phone: [phone]   Fax:  [phone]  Occupational Therapy Evaluation  Patient Details  Name: Elizabeth Mcintosh MRN: [id number] Date of Birth: 1945/12/09 No Data Recorded  Encounter Date: 07/12/2015      OT End of Session - 07/12/15 1250    Visit Number 1   Number of Visits 17   Date for OT Re-Evaluation 09/08/15   Authorization Type [employer] Medicare, no auth, no visit limit, G-code needed   Authorization - Visit Number 1   Authorization - Number of Visits 10   OT Start Time 1102   OT Stop Time 1145   OT Time Calculation (min) 43 min   Activity Tolerance Patient tolerated treatment well   Behavior During Therapy [facility] for tasks assessed/performed      Past Medical History  Diagnosis Date  . Aneurysm (HCC)     s/p clips  . Glaucoma   . Parkinson's disease (HCC)   . Sleep apnea   . Hypotension     Past Surgical History  Procedure Laterality Date  . Craniotomy    . Aneurym clipping      There were no vitals filed for this visit.  Visit Diagnosis:  Bradykinesia  Rigidity  Lack of coordination  Abnormal posture  Decreased functional mobility  Unsteadiness  Joint stiffness      Subjective Assessment - 07/12/15 1107    Subjective  Pt reports that she hasn't fallen since Thanksgiving (going up steps on corner)   Pertinent History Parkinsonism diagnosis 2015, MSA diagnosis 2016, hx of aneurysm clipping, glaucoma, orthostatic hypotension, hx of multiple falls   Patient Stated Goals improve coordination, ROM, ADLs   Currently in Pain? No/denies  pain in R heel at night at times, but sometimes at night-- has had MD address, unknown cause   Effect of Pain on Daily Activities OT will not address due to nature/location           [facility] OT Assessment - 07/12/15 1110    Assessment   Diagnosis MSA   Referring Provider --  Dr. Lurena Joiner Tat   Onset Date --   2015 Parkinsonism, 2016 MSA diagnosis   Prior Therapy OT, PT, ST last d/c 10/2014   Precautions   Precautions Fall   Balance Screen   Has the patient fallen in the past 6 months Yes   How many times? multiple  last was last Thanksgiving   Home  Environment   Family/patient expects to be discharged to: Private residence   Lives With Spouse   Prior Function   Level of Independence Needs assistance with transfers;Independent with basic ADLs   Leisure [employer] personal trainer 1-2 times per week   Comments no longer traveling, [employer] with trainer 1-2x, 1-2x week with friend   ADL   Eating/Feeding --  spills every meal, difficulty cutting food   Eating/Feeding Patient Percentage --  difficulty spearing food with fork   Grooming --  incr time   Upper Body Bathing Modified independent   Lower Body Bathing Moderate assistance   Upper Body Dressing Increased time  difficulty with fastening bra, wearing binder   Lower Body Dressing Increased time  needs A for compression stockings   Toilet Tranfer --  grab bar, urgency (potty chair at night)   Toileting - Clothing Manipulation Modified independent   Toileting -  Therapist, occupational bars  shower seat   ADL comments Pt reports incr time for bed mobility   IADL   Shopping --  husband performs now   [employer] --  husband performs now   Meal Prep --  husband performs now, pt can do snack prep   Community Mobility --  husband driving now, stopped last summer   Medication Management Is responsible for taking medication in correct dosages at correct time   Prior Level of Function Financial Management husband has always performed    Mobility   Mobility Status Independent;History of falls   Written Expression   Dominant Hand Right   Handwriting --  mod micrographia, 100% today for sentence   Written Experience --  pt reports writing  typically worse    Vision - History   Baseline Vision Bifocals  but only needs for reading   Visual History Glaucome   Additional Comments denies diplopia currently (had some with reading prior to glasses adjustment)   Cognition   Overall Cognitive Status Cognition to be further assessed in functional context PRN  [facility] for eval today, pt denies changes   Bradyphrenia Yes  mild per pt report    Observation/Other Assessments   Observations Rounded shoulders    Standing Functional Reach Test 12 inches bilaterally   Other Surveys  Select   Physical Performance Test   Yes   Simulated Eating Time (seconds) 16.28   Donning Doffing Jacket Time (seconds) 61.38, 55.41sec (with no-minimal trunk movment noted)   Donning Doffing Jacket Comments Buttoning/unbuttoning 3 buttons on table in 43.71sec   Coordination   9 Hole Peg Test Right;Left   Right 9 Hole Peg Test 42.94   Left 9 Hole Peg Test 37.12   Box and Blocks R-38blocks, L-38 blocks   Tremors none noted   Tone   Assessment Location Right Upper Extremity;Left Upper Extremity   ROM / Strength   AROM / PROM / Strength AROM;PROM   AROM   Overall AROM  Deficits   Overall AROM Comments Grossly WNL except R shoulder flex 120* (L 140*) and with -25* elbow ext at end range shoulder flex, but -20* at lower shoulder range, decr finger abduction R hand, despite cueing   PROM   Overall PROM  Deficits   Overall PROM Comments decr R shoulder and R elbow ext    RUE Tone   RUE Tone --  mild-mod   LUE Tone   LUE Tone Mild                         OT Education - 07/12/15 1708    Education Details importance of performing HEP/Stretching on days that she doesn't go to the gym, use of trunk rotation with donning/doffing jacket, results of eval (compared to last round of therapy)   Person(s) Educated Patient   Methods Explanation   Comprehension Verbalized understanding          OT Short Term Goals - 07/12/15 1654    OT SHORT  TERM GOAL #1   Title Pt will be independent with updated HEP.--ck 09/09/15   Time 4   Period Weeks   Status New   OT SHORT TERM GOAL #2   Title Pt will write at least 3 sentences with 100% legibility and only min decr in size.    Time 4   Period Weeks   Status New   OT SHORT TERM GOAL #3   Title Pt will demo at least 125*  R shoulder flex for functional reaching with -20* elbow extension or less.   Baseline 120*   Time 4   Period Weeks   Status New   OT SHORT TERM GOAL #4   Title Pt will improve ability/ease with donning/doffing jacket as shown by improving time on PPT#4 by at least 8sec.   Baseline 55.41sec   Time 4   Period Weeks   Status New           OT Long Term Goals - July 15, 2015 1656    OT LONG TERM GOAL #1   Title Pt will verbalize understanding of updated AE/strategies to increase ease/independence with ADLs/IADLs prn.--09/09/15   Time 8   Period Weeks   Status New   OT LONG TERM GOAL #2   Title Pt will improve coordination/functional reaching for ADLs as shown by improving score on box and blocks test by at least 5 blocks bilaterally.   Baseline R-38 blocks, L-38 blocks   Time 8   Period Weeks   Status New   OT LONG TERM GOAL #3   Title Pt will demo at least 130* R shoulder flex for functional reaching.   Baseline 120*   Time 8   Period Weeks   Status New   OT LONG TERM GOAL #4   Title Pt will improve coordination for ADLs as shown by improving time on 9-hole peg test by at least 5 sec with RUE.   Baseline R-42.94sec   Time 8   Period Weeks   Status New   OT LONG TERM GOAL #5   Title Pt will improve ability/ease with donning/doffing jacket as shown by improving time on PPT#4 by at least 15sec.   Baseline 55.41sec   Time 8   Period Weeks   Status New               Plan - 07/15/2015 1252    Clinical Impression Statement Pt is a 70 y.o. female with diagnosis of Parkinsons in 2015 and MSA diagnosis in 2016.  Pt presents with incr bradykinesia and  rigidity, decr coordination, decr RUE ROM, decr balance/functional mobility for ADLs and continued decr posture.  Pt would benefit from occupational therapy to address these deficits in order to incr ease with ADLs, prevent future complications, and improve quality of life.   Pt will benefit from skilled therapeutic intervention in order to improve on the following deficits (Retired) Decreased coordination;Decreased activity tolerance;Impaired tone;Impaired UE functional use;Decreased mobility;Decreased balance;Decreased range of motion  bradykinesia   Rehab Potential Good   OT Frequency 2x / week   OT Duration 8 weeks  +eval    OT Treatment/Interventions Self-care/ADL training;Neuromuscular education;Building services engineer;Therapeutic exercise   Plan PWR! exercises (supine, seated, PWR! hands)   Consulted and Agree with Plan of Care Patient          G-Codes - 07/15/2015 1712    Functional Assessment Tool Used PPT#4 in 55.41sec, mod micrographia, R shoulder flex 120* with -25* elbow ext for functional reach   Functional Limitation Self care   Self Care Current Status (W0981) At least 40 percent but less than 60 percent impaired, limited or restricted   Self Care Goal Status (X9147) At least 1 percent but less than 20 percent impaired, limited or restricted      Problem List Patient Active Problem List   Diagnosis Date Noted  . 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 Depression 11/22/2013  . 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, [place], [address] Phone: [phone]   Fax:  [phone]  Name: Elizabeth Mcintosh MRN: [id number] Date of Birth: 1945-12-30  Willa Frater, OTR/L 07/12/2015 5:14 PM

## 2015-07-12 NOTE — Therapy (Signed)
[facility] [address] [place], [place], [address] Phone: [phone]   Fax:  [phone]  Speech Language Pathology Evaluation  Patient Details  Name: Elizabeth Mcintosh MRN: [id number] Date of Birth: August 15, 1945 Referring Provider: Dr. Lurena Joiner Tat  Encounter Date: 07/12/2015      End of Session - 07/12/15 1203    Activity Tolerance Patient tolerated treatment well      Past Medical History  Diagnosis Date  . Aneurysm (HCC)     s/p clips  . Glaucoma   . Parkinson's disease (HCC)   . Sleep apnea   . Hypotension     Past Surgical History  Procedure Laterality Date  . Craniotomy    . Aneurym clipping      There were no vitals filed for this visit.  Visit Diagnosis: Dysarthria - Plan: SLP plan of care cert/re-cert      Subjective Assessment - 07/12/15 0940    Subjective "I have been doing well - I'm very croaky today"   Currently in Pain? No/denies            SLP Evaluation [facility] - 07/12/15 0940    SLP Visit Information   SLP Received On 07/12/15   Referring Provider Dr. Lurena Joiner Tat   Onset Date Spring 2014-symptoms, diagnosed 2015   Medical Diagnosis MSA   Subjective   Patient/Family Stated Goal To get rid of the frogginess and to speak a little more loudly   General Information   HPI Pt with s/s Parkinson's Disease in 2014 and began treatment with orthopedic MD with different medical hypothesis. In 2015 was diagnosed with Parkinson's.    Mobility Status walk independently   Prior Functional Status   Cognitive/Linguistic Baseline Within functional limits   Type of Home House    Lives With Spouse   Available Support Family   Vocation Retired   Pain Assessment   Pain Assessment No/denies pain   Cognition   Overall Cognitive Status Within Functional Limits for tasks assessed   Oral Motor/Sensory Function   Overall Oral Motor/Sensory Function Appears within functional limits for tasks assessed    Facial Strength Within Functional Limits   Velum Within Functional Limits   Motor Speech   Overall Motor Speech Impaired   Respiration Impaired   Level of Impairment Sentence   Phonation Low vocal intensity   Resonance Within functional limits   Articulation Impaired   Intelligibility Intelligibility reduced   Sentence 75-100% accurate   Conversation 75-100% accurate   Motor Planning Witnin functional limits   Motor Speech Errors Aware   Effective Techniques Increased vocal intensity   Phonation Impaired   Volume Soft                      ADULT SLP TREATMENT - 07/12/15 0940    General Information   Behavior/Cognition Alert;Cooperative;Pleasant mood   Cognitive-Linquistic Treatment   Treatment focused on Dysarthria   Skilled Treatment Intiated training for deep breathing - pt with difficulty with abdmonimal breathing due to abdominal compression binder. Trained pt in loud /a/ for home practice. Pt demonstrated adequate breath support with reading simple sentences with occasional to usual ,min A.   10 minutes of conversational speech was reduced today, at average 67dB (WNL= average 70-72dB) with range of 63 to 70dB, when a sound level meter was placed 30 cm away from pt's mouth. Overall intelligibility for this listener in a quiet environment was approx 95%. Production of loud /a/ averaged  dB84 (range of 82 to 86) and min to mod cues occasionally needed for loudness.   Oral motor assessment revealed WFL lingual ROM and WFL lingual strength. Labial ROM was [facility] and strength was [facility]. Velar ROM appeared [facility].   Pt rated effort level at 6/10 for production of loud /a/ (10=maximal effort). In sentence tasks, pt was asked to use the same amount of effort as with loud /a/. Loudness average with this increased effort was 70dB (range of 68 to 72) with min A usually for loudness. Pt would benefit from skilled ST in order to improve speech intelligibility and pt's QOL.    Assessment /  Recommendations / Plan   Plan Continue with current plan of care           SLP Education - 07/15/15 [phone]    Education provided Yes   Education Details compensations for dysarthria, goals of therapy, loud /a/   Person(s) Educated Patient   Methods Explanation;Demonstration;Verbal cues;Handout   Comprehension Verbalized understanding;Verbal cues required          SLP Short Term Goals - Jul 15, 2015 1203    SLP SHORT TERM GOAL #1   Title Pt will demonstrate loud /a/ of 85 dB over 3 sessions with rare min A   Time 4   Period Weeks   Status New   SLP SHORT TERM GOAL #2   Title Pt will average 70dB during structured speech tasks with rare min A   Time 4   Period Weeks   Status New   SLP SHORT TERM GOAL #3   Title Pt will average 70dB over 5 minute conversation with rare min A   Time 4   Period Weeks   Status New          SLP Long Term Goals - 15-Jul-2015 1205    SLP LONG TERM GOAL #1   Title Pt will maintain average of 70dB over 8 munute conversation with occasional min A   Time 8   Period Weeks   Status New   SLP LONG TERM GOAL #2   Title Pt will speak audibly in noisy environment during 8 minute conversation with rare min A   Time 8   Period Weeks   Status New          Plan - 15-Jul-2015 1203    Duration --  8 weeks   Treatment/Interventions Compensatory techniques;Internal/external aids;SLP instruction and feedback;Functional tasks;Patient/family education   Potential to Achieve Goals Good   Potential Considerations Severity of impairments   Consulted and Agree with Plan of Care Patient          G-Codes - 07/15/15 10/09/05    Functional Assessment Tool Used noms, skilled clinical observation   Functional Limitations Motor speech   Motor Speech Current Status [phone]) At least 20 percent but less than 40 percent impaired, limited or restricted   Motor Speech Goal Status (W0981) At least 1 percent but less than 20 percent impaired, limited or restricted       Problem List Patient Active Problem List   Diagnosis Date Noted  . 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 Depression 11/22/2013  . 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 [place], [place], [address] Phone: [phone]   Fax:  [phone]  Name: Elizabeth Mcintosh MRN: [id number] Date of Birth: December 21, 1945

## 2015-07-12 NOTE — Therapy (Signed)
[facility] [address] [place], [place], [address] Phone: [phone]   Fax:  [phone]  Physical Therapy Evaluation  Patient Details  Name: Elizabeth Mcintosh MRN: [id number] Date of Birth: 1945/12/04 Referring Provider: Lurena Joiner Tat  Encounter Date: 07/12/2015      PT End of Session - 07/13/15 1711    Visit Number 1   Number of Visits 17   Date for PT Re-Evaluation 09/11/15   Authorization Type UHC Medicare-Gcode every 10th visit   PT Start Time 1020   PT Stop Time 1102   PT Time Calculation (min) 42 min   Activity Tolerance Patient tolerated treatment well   Behavior During Therapy [facility] for tasks assessed/performed      Past Medical History  Diagnosis Date  . Aneurysm (HCC)     s/p clips  . Glaucoma   . Parkinson's disease (HCC)   . Sleep apnea   . Hypotension     Past Surgical History  Procedure Laterality Date  . Craniotomy    . Aneurym clipping      There were no vitals filed for this visit.  Visit Diagnosis:  Abnormality of gait  Bradykinesia  Rigidity  Lack of coordination  Abnormal posture      Subjective Assessment - 07/12/15 1022    Subjective Pt is a 70 year old female who presents to OP PT with diagnosis of MSA (Parkinsonism).  She reports having several falls in the past 6-9 months (4 or 5 falls).  One fall occurred on the stairs, several falls were due to tripping.  She reports tendency to fall backwards.  She uses a cane rarely, uses a walker never.   Patient Stated Goals Pt's goal for therapy is to get a little more steady with walking.   Currently in Pain? No/denies            [facility] PT Assessment - 07/12/15 1028    Assessment   Medical Diagnosis MSA-Parkinsonism   Referring Provider Lurena Joiner Tat   Onset Date/Surgical Date --  Thanksgiving time-falls/screens 05/17/15   Precautions   Precautions Fall   Balance Screen   Has the patient fallen in the past 6 months Yes   How  many times? 4-5   Has the patient had a decrease in activity level because of a fear of falling?  No   Is the patient reluctant to leave their home because of a fear of falling?  No   Home Environment   Living Environment Private residence   Living Arrangements Spouse/significant other   Available Help at Discharge Family   Type of Home House   Home Access Stairs to enter   Entrance Stairs-Number of Steps 15   Entrance Stairs-Rails Can reach both  for 6 steps, then one rail on L   Home Layout One level;Multi-level;Bed/bath upstairs   Home Equipment [place] - single point;Walker - 2 wheels;Transport chair   Prior Function   Level of [place] Needs assistance with ADLs;Needs assistance with gait;Needs assistance with transfers  Needs assist at times   Leisure [facility] personal trainer 1-2 times per week   Comments reports walking track at [facility] 1-2 times per week   ROM / Strength   AROM / PROM / Strength Strength   Strength   Overall Strength Deficits   Overall Strength Comments grossly tested 4/5 bilateral lower extremities   Transfers   Transfers Sit to Stand;Stand to Sit   Sit to Stand 5: Supervision;With upper extremity assist;From  chair/3-in-1;With armrests   Five time sit to stand comments  16.28  comes up to crouched standing position   Stand to Sit 5: Supervision;With upper extremity assist;With armrests;To chair/3-in-1  prefers UE support   Comments Pt describes difficulty getting up from chairs at home, having to push up with arms.  Also describes difficulty with pushing chairs up to table   Ambulation/Gait   Ambulation/Gait Yes   Ambulation/Gait Assistance 5: Supervision   Ambulation Distance (Feet) 150 Feet   Assistive device None   Gait Pattern Step-through pattern;Decreased arm swing - right;Decreased arm swing - left;Decreased step length - right;Decreased dorsiflexion - right;Decreased trunk rotation;Narrow base of support;Poor foot clearance - right   Ambulation  Surface Level;Indoor   Gait velocity 11.56 sec= 2.84 ft/sec   Standardized Balance Assessment   Standardized Balance Assessment Timed Up and Go Test;Four Square Step Test   Timed Up and Go Test   Normal TUG (seconds) 13.48   Manual TUG (seconds) 13.67   Cognitive TUG (seconds) 13.51  doesn't fully complete turn to sit in chair   TUG Comments Pt demonstrates decreased safety awareness and decreased body position sense when turning to sit.   Four Square Step Test    Trial One  15.14  initial hesitation in backwards direction          Mini-BESTest: Balance Evaluation Systems Test  2005-2013 [facility] [employer]. All rights reserved. ________________________________________________________________________________________Anticipatory_________Subscore___2__/6 1. SIT TO STAND Instruction: "Cross your arms across your chest. Try not to use your hands unless you must.Do not let your legs lean against the back of the chair when you stand. Please stand up now." (2) Normal: Comes to stand without use of hands and stabilizes independently. (1) Moderate: Comes to stand WITH use of hands on first attempt. X (0) Severe: Unable to stand up from chair without assistance, OR needs several attempts with use of hands. 2. RISE TO TOES Instruction: "Place your feet shoulder width apart. Place your hands on your hips. Try to rise as high as you can onto your toes. I will count out loud to 3 seconds. Try to hold this pose for at least 3 seconds. Look straight ahead. Rise now." (2) Normal: Stable for 3 s with maximum height. (1) Moderate: Heels up, but not full range (smaller than when holding hands), OR noticeable instability for 3 s. X (0) Severe: < 3 s. 3. STAND ON ONE LEG Instruction: "Look straight ahead. Keep your hands on your hips. Lift your leg off of the ground behind you without touching or resting your raised leg upon your other standing leg. Stay standing on one leg as long as  you can. Look straight ahead. Lift now." Left: Time in Seconds Trial 1:_4 sec____Trial 2:__1.67 sec___ (2) Normal: 20 s. (1) Moderate: < 20 s. (0) Severe: Unable. Right: Time in Seconds Trial 1:_2 sec____Trial 2:___0__ (increased lean to R) (2) Normal: 20 s. (1) Moderate: < 20 s. (0) Severe: Unable X To score each side separately use the trial with the longest time. To calculate the sub-score and total score use the side [left or right] with the lowest numerical score [i.e. the worse side]. ______________________________________________________________________________________Reactive Postural Control___________Subscore:__6___/6 4. COMPENSATORY STEPPING CORRECTION- FORWARD Instruction: "Stand with your feet shoulder width apart, arms at your sides. Lean forward against my hands beyond your forward limits. When I let go, do whatever is necessary, including taking a step, to avoid a fall." (2) Normal: Recovers independently with a single, large step (second realignment  step is allowed).X (1) Moderate: More than one step used to recover equilibrium. (0) Severe: No step, OR would fall if not caught, OR falls spontaneously. 5. COMPENSATORY STEPPING CORRECTION- BACKWARD Instruction: "Stand with your feet shoulder width apart, arms at your sides. Lean backward against my hands beyond your backward limits. When I let go, do whatever is necessary, including taking a step, to avoid a fall." (2) Normal: Recovers independently with a single, large step.X (1) Moderate: More than one step used to recover equilibrium. (0) Severe: No step, OR would fall if not caught, OR falls spontaneously. 6. COMPENSATORY STEPPING CORRECTION- LATERAL Instruction: "Stand with your feet together, arms down at your sides. Lean into my hand beyond your sideways limit. When I let go, do whatever is necessary, including taking a step, to avoid a fall." Left (2) Normal: Recovers independently with 1 stepX (crossover or  lateral OK). (1) Moderate: Several steps to recover equilibrium. (0) Severe: Falls, or cannot step. Right (2) Normal: Recovers independently with 1 stepX (crossover or lateral OK). (1) Moderate: Several steps to recover equilibrium. (0) Severe: Falls, or cannot step. Use the side with the lowest score to calculate sub-score and total score. ____________________________________________________________________________________Sensory Orientation_____________Subscore:___4______/6 7. STANCE (FEET TOGETHER); EYES OPEN, FIRM SURFACE Instruction: "Place your hands on your hips. Place your feet together until almost touching. Look straight ahead. Be as stable and still as possible, until I say stop." Time in seconds:________ (2) Normal: 30 s. (1) Moderate: < 30 s.X (0) Severe: Unable. 8. STANCE (FEET TOGETHER); EYES CLOSED, FOAM SURFACE Instruction: "Step onto the foam. Place your hands on your hips. Place your feet together until almost touching. Be as stable and still as possible, until I say stop. I will start timing when you close your eyes." Time in seconds:________ (2) Normal: 30 s. (1) Moderate: < 30 s. X (0) Severe: Unable. 9. INCLINE- EYES CLOSED Instruction: "Step onto the incline ramp. Please stand on the incline ramp with your toes toward the top. Place your feet shoulder width apart and have your arms down at your sides. I will start timing when you close your eyes." Time in seconds:________ (2) Normal: Stands independently 30 s and aligns with gravity. X (1) Moderate: Stands independently <30 s OR aligns with surface. (0) Severe: Unable. _________________________________________________________________________________________Dynamic Gait ______Subscore__6______/10 10. CHANGE IN GAIT SPEED Instruction: "Begin walking at your normal speed, when I tell you 'fast', walk as fast as you can. When I say 'slow', walk very slowly." (2) Normal: Significantly changes walking speed  without imbalance. (1) Moderate: Unable to change walking speed or signs of imbalance. X (0) Severe: Unable to achieve significant change in walking speed AND signs of imbalance. 11. WALK WITH HEAD TURNS - HORIZONTAL Instruction: "Begin walking at your normal speed, when I say "right", turn your head and look to the right. When I say "left" turn your head and look to the left. Try to keep yourself walking in a straight line." (2) Normal: performs head turns with no change in gait speed and good balance. (1) Moderate: performs head turns with reduction in gait speed. X (0) Severe: performs head turns with imbalance. 12. WALK WITH PIVOT TURNS Instruction: "Begin walking at your normal speed. When I tell you to 'turn and stop', turn as quickly as you can, face the opposite direction, and stop. After the turn, your feet should be close together." (2) Normal: Turns with feet close FAST (< 3 steps) with good balance. (1) Moderate: Turns with feet close SLOW (>4  steps) with good balance. X (0) Severe: Cannot turn with feet close at any speed without imbalance. 13. STEP OVER OBSTACLES Instruction: "Begin walking at your normal speed. When you get to the box, step over it, not around it and keep walking." (2) Normal: Able to step over box with minimal change of gait speed and with good balance. (1) Moderate: Steps over box but touches box OR displays cautious behavior by slowing gait. X (0) Severe: Unable to step over box OR steps around box. 14. TIMED UP & GO WITH DUAL TASK [3 METER WALK] Instruction TUG: "When I say 'Go', stand up from chair, walk at your normal speed across the tape on the floor, turn around, and come back to sit in the chair." Instruction TUG with Dual Task: "Count backwards by threes starting at ___. When I say 'Go', stand up from chair, walk at your normal speed across the tape on the floor, turn around, and come back to sit in the chair. Continue counting backwards the entire  time." TUG: ________seconds; Dual Task TUG: ________seconds (2) Normal: No noticeable change in sitting, standing or walking while backward counting when compared to TUG without Dual Task.X (1) Moderate: Dual Task affects either counting OR walking (>10%) when compared to the TUG without Dual Task. (0) Severe: Stops counting while walking OR stops walking while counting. When scoring item 14, if subject's gait speed slows more than 10% between the TUG without and with a Dual Task the score should be decreased by a point. TOTAL SCORE: ____18____/28                     PT Short Term Goals - 07/13/15 1724    PT SHORT TERM GOAL #1   Title Pt will perform HEP with family supervision for improved balance, transfers, and gait.  TARGET 08/11/15   Time 4   Period Weeks   Status New   PT SHORT TERM GOAL #2   Title Pt will perform transfers from 18" surfaces and below with minimal use of hands, using proper and safe technique, 8 of 10 trials, for improved transfer safety.   Time 4   Period Weeks   Status New   PT SHORT TERM GOAL #3   Title Pt will improve 4-square step test to less than 14 seconds with no loss of balance.   Time 4   Period Weeks   Status New   PT SHORT TERM GOAL #4   Title Pt will verbalize understanding of fall prevention within the home environment.   Time 4   Period Weeks   Status New           PT Long Term Goals - 07/13/15 1726    PT LONG TERM GOAL #1   Title Pt will improve Mini-Bestest score to at least 22/28 for decreased fall risk.   Time 8   Period Weeks   Status New   PT LONG TERM GOAL #2   Title Pt will demonstrate proper, safe technique for pushing/pulling chairs away from table, modified independently, 4 of 5 trials.   Time 8   Period Weeks   Status New   PT LONG TERM GOAL #3   Title Pt will ambulate at least 500 ft with appropriate assistive device modified independently, without loss of balance.   Time 8   Period Weeks   Status  New   PT LONG TERM GOAL #4   Title Pt will perform floor>stand transfer with minimal  UE support, modified independently, for safe fall recovery.               Plan - 07/13/15 1714    Clinical Impression Statement Pt is a 70 year old female who presents to OP PT with history of multiple systems atrophy (MSA) with recent history of several falls in the past 6 months.  She was screened at our clinic in early December, identifying the need for follow up PT evaluation.  Pt demonstrates decreased lower extremity strength, decreased safety awareness with gait and transfers, decreased timing and coordination of gait, recent history of falls, postural abnormality, decreased balance.  Pt is at fall risk per [facility] and has difficulty with turning to sit safely with transfers and has difficulty negotiating posterior direction in 4-square step test.  She reports difficulty with transfers at home and difficulty with pushing/pulling table chair up to dinner table.  Pt would benefit from further skilled PT to address the above deficits to improve functional mobility and decrease fall risk.   Pt will benefit from skilled therapeutic intervention in order to improve on the following deficits Abnormal gait;Decreased balance;Decreased mobility;Decreased safety awareness;Decreased coordination;Difficulty walking;Impaired flexibility;Postural dysfunction   Rehab Potential Good   PT Frequency 2x / week   PT Duration 8 weeks  plus eval   PT Treatment/Interventions ADLs/Self Care Home Management;Therapeutic exercise;Therapeutic activities;Functional mobility training;Gait training;DME Instruction;Balance training;Neuromuscular re-education;Patient/family education   PT Next Visit Plan Initiate HEP-try OTAGO, balance strategy exercises, gait with assistive device   Consulted and Agree with Plan of Care Patient          G-Codes - 2015/08/04 1730    Functional Assessment Tool Used Four square step test 15.14  sec, Mini Bestest 18/28, at least 4 falls past 6 months   Functional Limitation Mobility: Walking and moving around   Mobility: Walking and Moving Around Current Status [phone]) At least 40 percent but less than 60 percent impaired, limited or restricted   Mobility: Walking and Moving Around Goal Status [phone]) At least 20 percent but less than 40 percent impaired, limited or restricted       Problem List Patient Active Problem List   Diagnosis Date Noted  . 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 Depression 11/22/2013  . 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 07/13/2015, 5:32 PM Gean Maidens., PT [facility] [address] [place], [place], [address] Phone: [phone]   Fax:  [phone]  Name: Elizabeth Mcintosh MRN: [id number] Date of Birth: 1946/04/30

## 2015-07-26 ENCOUNTER — Ambulatory Visit: Payer: Medicare Other | Admitting: Occupational Therapy

## 2015-07-26 ENCOUNTER — Ambulatory Visit: Payer: Medicare Other | Admitting: Physical Therapy

## 2015-07-26 DIAGNOSIS — R29898 Other symptoms and signs involving the musculoskeletal system: Secondary | ICD-10-CM

## 2015-07-26 DIAGNOSIS — R258 Other abnormal involuntary movements: Secondary | ICD-10-CM

## 2015-07-26 DIAGNOSIS — R471 Dysarthria and anarthria: Secondary | ICD-10-CM | POA: Diagnosis not present

## 2015-07-26 DIAGNOSIS — R2681 Unsteadiness on feet: Secondary | ICD-10-CM

## 2015-07-26 DIAGNOSIS — R269 Unspecified abnormalities of gait and mobility: Secondary | ICD-10-CM

## 2015-07-26 DIAGNOSIS — R279 Unspecified lack of coordination: Secondary | ICD-10-CM

## 2015-07-26 DIAGNOSIS — R293 Abnormal posture: Secondary | ICD-10-CM

## 2015-07-26 NOTE — Therapy (Signed)
[facility] [address] [place], [place], [address] Phone: [phone]   Fax:  [phone]  Physical Therapy Treatment  Patient Details  Name: Elizabeth Mcintosh MRN: [id number] Date of Birth: June 10, 1945 Referring Provider: Lurena Joiner Tat  Encounter Date: 07/26/2015      PT End of Session - 07/26/15 1053    Visit Number 2   Number of Visits 17   Date for PT Re-Evaluation 09/11/15   Authorization Type UHC Medicare-Gcode every 10th visit   PT Start Time 0933   PT Stop Time 1016   PT Time Calculation (min) 43 min   Equipment Utilized During Treatment Gait belt   Activity Tolerance Patient tolerated treatment well   Behavior During Therapy [facility] for tasks assessed/performed      Past Medical History  Diagnosis Date  . Aneurysm (HCC)     s/p clips  . Glaucoma   . Parkinson's disease (HCC)   . Sleep apnea   . Hypotension     Past Surgical History  Procedure Laterality Date  . Craniotomy    . Aneurym clipping      There were no vitals filed for this visit.  Visit Diagnosis:  Bradykinesia  Rigidity  Unsteadiness  Abnormality of gait      Subjective Assessment - 07/26/15 0936    Subjective No falls, no changes since eval   Patient Stated Goals Pt's goal for therapy is to get a little more steady with walking.   Currently in Pain? No/denies                         [facility] Adult PT Treatment/Exercise - 07/26/15 0001    Transfers   Transfers Sit to Stand;Stand to Sit   Sit to Stand 5: Supervision;Without upper extremity assist;From elevated surface;From chair/3-in-1;4: Min guard   Stand to Sit 5: Supervision;4: Min guard;Without upper extremity assist;To elevated surface;To chair/3-in-1;Uncontrolled descent   Number of Reps 10 reps;Other sets (comment)  22", 20, then 18" surfaces   Transfer Cueing Cues needed for initial forward lean and to hinge at hips to control descent into sitting   Comments Also performed block practice, repetition of proper techniqe to pull chair away from table, safely and efficiently scoot up to table, then safely/efficiently push chair away from table and stand to walk away from table, using large amplitude, deliberate movement patterns, based on pt's c/o that this is number one problem for her at home/in restaurants.   High Level Balance   High Level Balance Activities Side stepping;Backward walking  Forward/back at counter, 4 reps cues for pacing   High Level Balance Comments At counter, practiced wide BOS lateral weightshifting, then stagger stance forward/back weigthshifting (added to HEP) to address improved standing balance and safety, at least 10 reps each exercise with min guard assistance and UE support.                PT Education - 07/26/15 1049    Education provided Yes   Education Details HEP-transfer practice, getting up to and away from table, stagger stance position in standing-see instructions   Person(s) Educated Patient   Methods Explanation;Demonstration;Handout   Comprehension Verbalized understanding;Returned demonstration;Need further instruction          PT Short Term Goals - 07/13/15 1724    PT SHORT TERM GOAL #1   Title Pt will perform HEP with family supervision for improved balance, transfers, and gait.  TARGET 08/11/15  Time 4   Period Weeks   Status New   PT SHORT TERM GOAL #2   Title Pt will perform transfers from 18" surfaces and below with minimal use of hands, using proper and safe technique, 8 of 10 trials, for improved transfer safety.   Time 4   Period Weeks   Status New   PT SHORT TERM GOAL #3   Title Pt will improve 4-square step test to less than 14 seconds with no loss of balance.   Time 4   Period Weeks   Status New   PT SHORT TERM GOAL #4   Title Pt will verbalize understanding of fall prevention within the home environment.   Time 4   Period Weeks   Status New           PT Long  Term Goals - 07/13/15 1726    PT LONG TERM GOAL #1   Title Pt will improve Mini-Bestest score to at least 22/28 for decreased fall risk.   Time 8   Period Weeks   Status New   PT LONG TERM GOAL #2   Title Pt will demonstrate proper, safe technique for pushing/pulling chairs away from table, modified independently, 4 of 5 trials.   Time 8   Period Weeks   Status New   PT LONG TERM GOAL #3   Title Pt will ambulate at least 500 ft with appropriate assistive device modified independently, without loss of balance.   Time 8   Period Weeks   Status New   PT LONG TERM GOAL #4   Title Pt will perform floor>stand transfer with minimal UE support, modified independently, for safe fall recovery.               Plan - 07/26/15 1053    Clinical Impression Statement Focus of treatment session today was on proper, safe, efficient technique for sit<>stand transfers as well as get up to and away from table using arm and armless chairs.  This address pt's number one complaint for home and social restaurant situations.  Pt responds well to cues for pacing and for technique and is able to return demonstrate technique with close supervision and min cues by end of session.  Pt will continue to benefit from further skilled PT to address balance, transfers, and gait for improved functional mobility/decreased risk of falls.   Pt will benefit from skilled therapeutic intervention in order to improve on the following deficits Abnormal gait;Decreased balance;Decreased mobility;Decreased safety awareness;Decreased coordination;Difficulty walking;Impaired flexibility;Postural dysfunction   Rehab Potential Good   PT Frequency 2x / week   PT Duration 8 weeks  plus eval   PT Treatment/Interventions ADLs/Self Care Home Management;Therapeutic exercise;Therapeutic activities;Functional mobility training;Gait training;DME Instruction;Balance training;Neuromuscular re-education;Patient/family education   PT Next Visit  Plan Review HEP given this visit; continue to work on balance activities, including weightshifting and step strategies; gait/functional activities   Consulted and Agree with Plan of Care Patient        Problem List Patient Active Problem List   Diagnosis Date Noted  . 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 Depression 11/22/2013  . 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 07/26/2015, 10:58 AM  Gean Maidens., PT  [facility] [address] [place], [place], [address] Phone: [phone]   Fax:  [phone]  Name: Elizabeth Mcintosh MRN: [id number] Date of Birth: [birth year]

## 2015-07-26 NOTE — Patient Instructions (Addendum)
Sit to Stand Transfers:  1. Scoot out to the edge of the chair 2. Place your feet flat on the floor, shoulder width apart.  Make sure your feet are tucked just under your knees. 3. Lean forward (nose over toes) with momentum, and stand up tall with your best posture.  If you need to use your arms, use them as a quick boost up to stand. 4. If you are in a low or soft chair, you can lean back and then forward up to stand, in order to get more momentum. 5. Once you are standing, make sure you are looking ahead and standing tall.  To sit down:  1. Back up until you feel the chair behind your legs. 2. Bend at you hips, reaching  Back for you chair, if needed, then slowly squat to sit down on your chair.    For pulling up to the table: -Scoot up to the edge of the chair and tuck your feet under your knees.   -"Stick your face to your plate" (lifting your bottom off the chair) and pull the chair towards/under you. -Sit back down in the chair.  Repeat the above steps as many times as you need, using big, deliberate movements until you are sitting close enough to the table.  For pushing away from the table: -Scoot to edge of the chair and tuck your feet under your knees. "Stick your face to your plate" lifting your bottom off the chair and push the chair away from you. -Sit back down in the chair.  Repeat the above steps as many times as you need, using big deliberate movements until you have enough room to step away from the table.  Make sure that you fully stand up and get your balance before you step away from the table.    Provided patient with handout depicting stagger stance foot position for stance position at counter, just in general standing, to allow for improved diagonal/ant-post weightshift in standing to avoid posterior loss of balance or increased forward flexed posture.

## 2015-07-27 NOTE — Therapy (Signed)
[facility] [address] [place], [place], [address] Phone: [phone]   Fax:  [phone]  Occupational Therapy Treatment  Patient Details  Name: Elizabeth Mcintosh MRN: [id number] Date of Birth: 01-21-1946 No Data Recorded  Encounter Date: 07/26/2015      OT End of Session - 07/27/15 1443    Visit Number 2   Number of Visits 17   Date for OT Re-Evaluation 09/08/15   Authorization Type U[employer] Medicare, no auth, no visit limit, G-code needed   Authorization - Visit Number 2   Authorization - Number of Visits 10   OT Start Time 1021   OT Stop Time 1100   OT Time Calculation (min) 39 min   Activity Tolerance Patient tolerated treatment well   Behavior During Therapy [facility] for tasks assessed/performed      Past Medical History  Diagnosis Date  . Aneurysm (HCC)     s/p clips  . Glaucoma   . Parkinson's disease (HCC)   . Sleep apnea   . Hypotension     Past Surgical History  Procedure Laterality Date  . Craniotomy    . Aneurym clipping      There were no vitals filed for this visit.  Visit Diagnosis:  Bradykinesia  Rigidity  Lack of coordination  Abnormal posture      Subjective Assessment - 07/27/15 1442    Subjective  Pt reports no falls since November   Pertinent History Parkinsonism diagnosis 2015, MSA diagnosis 2016, hx of aneurysm clipping, glaucoma, orthostatic hypotension, hx of multiple falls   Patient Stated Goals improve coordination, ROM, ADLs   Currently in Pain? No/denies     Treatment: Reviewed strategies for handwriting, with  emphasis on larger size. Handwriting exercises then writing sentences, moderate to severe micrographia after several words, mod/ max v.c.                    [facility]) - 07/27/15 1444    PWR! exercises Moves in sitting;Moves in supine   PWR! Up 10   PWR! Rock 10   PWR! Twist 10   PWR! Step 10   Comments min -mod v.c. for larger amplitude  movements   PWR! Up 10   PWR! Rock 10   PWR! Twist 10   PWR! Step 10   Comments min-mod v.c. for larger movements             OT Education - 07/27/15 1446    Education provided Yes   Education Details PWR! basic 4 seated and supine   Person(s) Educated Patient   Methods Explanation;Demonstration;Handout;Verbal cues   Comprehension Verbalized understanding;Returned demonstration          OT Short Term Goals - 07/12/15 1654    OT SHORT TERM GOAL #1   Title Pt will be independent with updated HEP.--ck 09/09/15   Time 4   Period Weeks   Status New   OT SHORT TERM GOAL #2   Title Pt will write at least 3 sentences with 100% legibility and only min decr in size.    Time 4   Period Weeks   Status New   OT SHORT TERM GOAL #3   Title Pt will demo at least 125* R shoulder flex for functional reaching with -20* elbow extension or less.   Baseline 120*   Time 4   Period Weeks   Status New   OT SHORT TERM GOAL #4   Title Pt will improve  ability/ease with donning/doffing jacket as shown by improving time on PPT#4 by at least 8sec.   Baseline 55.41sec   Time 4   Period Weeks   Status New           OT Long Term Goals - 07/12/15 1656    OT LONG TERM GOAL #1   Title Pt will verbalize understanding of updated AE/strategies to increase ease/independence with ADLs/IADLs prn.--09/09/15   Time 8   Period Weeks   Status New   OT LONG TERM GOAL #2   Title Pt will improve coordination/functional reaching for ADLs as shown by improving score on box and blocks test by at least 5 blocks bilaterally.   Baseline R-38 blocks, L-38 blocks   Time 8   Period Weeks   Status New   OT LONG TERM GOAL #3   Title Pt will demo at least 130* R shoulder flex for functional reaching.   Baseline 120*   Time 8   Period Weeks   Status New   OT LONG TERM GOAL #4   Title Pt will improve coordination for ADLs as shown by improving time on 9-hole peg test by at least 5 sec with RUE.   Baseline  R-42.94sec   Time 8   Period Weeks   Status New   OT LONG TERM GOAL #5   Title Pt will improve ability/ease with donning/doffing jacket as shown by improving time on PPT#4 by at least 15sec.   Baseline 55.41sec   Time 8   Period Weeks   Status New               Problem List Patient Active Problem List   Diagnosis Date Noted  . 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 Depression 11/22/2013  . 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 [place], [place], [address] Phone: [phone]   Fax:  [phone]  Name: Elizabeth Mcintosh MRN: [id number] Date of Birth: 1945/06/30

## 2015-07-31 ENCOUNTER — Ambulatory Visit: Payer: Medicare Other | Admitting: Physical Therapy

## 2015-07-31 ENCOUNTER — Ambulatory Visit: Payer: Medicare Other | Admitting: Speech Pathology

## 2015-07-31 DIAGNOSIS — R258 Other abnormal involuntary movements: Secondary | ICD-10-CM

## 2015-07-31 DIAGNOSIS — R29898 Other symptoms and signs involving the musculoskeletal system: Secondary | ICD-10-CM

## 2015-07-31 DIAGNOSIS — R471 Dysarthria and anarthria: Secondary | ICD-10-CM | POA: Diagnosis not present

## 2015-07-31 DIAGNOSIS — R2689 Other abnormalities of gait and mobility: Secondary | ICD-10-CM

## 2015-07-31 NOTE — Therapy (Signed)
[facility] [address] [place], [place], [address] Phone: [phone]   Fax:  [phone]  Physical Therapy Treatment  Patient Details  Name: Elizabeth Mcintosh MRN: [id number] Date of Birth: 1945/12/27 Referring Provider: Lurena Joiner Tat  Encounter Date: 07/31/2015      PT End of Session - 07/31/15 2207    Visit Number 3   Number of Visits 17   Date for PT Re-Evaluation 09/11/15   Authorization Type UHC Medicare-Gcode every 10th visit   PT Start Time 0851   PT Stop Time 0930   PT Time Calculation (min) 39 min   Activity Tolerance Patient tolerated treatment well   Behavior During Therapy [facility] for tasks assessed/performed      Past Medical History  Diagnosis Date  . Aneurysm (HCC)     s/p clips  . Glaucoma   . Parkinson's disease (HCC)   . Sleep apnea   . Hypotension     Past Surgical History  Procedure Laterality Date  . Craniotomy    . Aneurym clipping      There were no vitals filed for this visit.  Visit Diagnosis:  Bradykinesia  Decreased functional mobility  Rigidity      Subjective Assessment - 07/31/15 0854    Subjective My family was estactic about how I could get up to and away from the table, even at a restaurant over the weekend.   Patient Stated Goals Pt's goal for therapy is to get a little more steady with walking.   Currently in Pain? No/denies                         [facility] Adult PT Treatment/Exercise - 07/31/15 0855    Bed Mobility   Bed Mobility Sit to Supine;Right Sidelying to Sit;Rolling Right  HOB elevated to simulate home;    Rolling Right 4: Min guard  Cues for technique for incr. momentum   Right Sidelying to Sit 4: Min guard;HOB elevated  cues for incr. momentum initiated with trunk rotation   Sit to Supine 5: Supervision   Sit to Supine - Details (indicate cue type and reason) Practiced sequence of supine>sit x 3 reps, with cues for increased momentum UEs  and lower extremities coordinated to sit EOB.   Transfers   Transfers Sit to Stand;Stand to Sit   Sit to Stand 5: Supervision;Without upper extremity assist;From elevated surface;From chair/3-in-1;4: Min guard   Sit to Stand Details (indicate cue type and reason) Needs occasional cues for increased initial forward lean  Cues to fully stand with best posture   Stand to Sit 5: Supervision;4: Min guard;Without upper extremity assist;To elevated surface;To chair/3-in-1   Number of Reps 10 reps;Other sets (comment)  from 20", 18", then 16" surfaces 10 reps each   Transfer Cueing Round robin sit<>stand activity to various height chairs for turning to sit practice x 8 reps with supervision and cues for full turn to sit.   Comments Pt return demonstrates technique for coming up to and away from table, using large amplitdue movements to scoot chair.  Pt needs minimal verbal cues for large amplitude movement with significant forward lean.   Exercises   Exercises Lumbar   Lumbar Exercises: Stretches   Single Knee to Chest Stretch 3 reps;20 seconds   Lower Trunk Rotation 3 reps;20 seconds  Also rocking side to side 5 reps   Pelvic Tilt 5 reps   Lumbar Exercises: Supine   Other Supine  Lumbar Exercises In hooklying position:  marching in place x 10 reps                PT Education - 07/31/15 2206    Education provided Yes   Education Details HEP-low back flexibility, bed mobility for sleeping   Person(s) Educated Patient   Methods Explanation;Demonstration;Handout   Comprehension Verbalized understanding;Returned demonstration;Verbal cues required          PT Short Term Goals - 07/13/15 1724    PT SHORT TERM GOAL #1   Title Pt will perform HEP with family supervision for improved balance, transfers, and gait.  TARGET 08/11/15   Time 4   Period Weeks   Status New   PT SHORT TERM GOAL #2   Title Pt will perform transfers from 18" surfaces and below with minimal use of hands, using  proper and safe technique, 8 of 10 trials, for improved transfer safety.   Time 4   Period Weeks   Status New   PT SHORT TERM GOAL #3   Title Pt will improve 4-square step test to less than 14 seconds with no loss of balance.   Time 4   Period Weeks   Status New   PT SHORT TERM GOAL #4   Title Pt will verbalize understanding of fall prevention within the home environment.   Time 4   Period Weeks   Status New           PT Long Term Goals - 07/13/15 1726    PT LONG TERM GOAL #1   Title Pt will improve Mini-Bestest score to at least 22/28 for decreased fall risk.   Time 8   Period Weeks   Status New   PT LONG TERM GOAL #2   Title Pt will demonstrate proper, safe technique for pushing/pulling chairs away from table, modified independently, 4 of 5 trials.   Time 8   Period Weeks   Status New   PT LONG TERM GOAL #3   Title Pt will ambulate at least 500 ft with appropriate assistive device modified independently, without loss of balance.   Time 8   Period Weeks   Status New   PT LONG TERM GOAL #4   Title Pt will perform floor>stand transfer with minimal UE support, modified independently, for safe fall recovery.               Plan - 07/31/15 2207    Clinical Impression Statement Pt feels/demonstrates she has made improvement in functional activity of scooting chairs up to and away from table.  Pt also identifies bed mobility as issue, including comfortable sleeping positions due to need to elevate head of bed because of blood pressure issues.  Pt responds well to cues for use of momentum, technique for supine>sit.  Pt will continue to benefit from further skilled PT to address balance, transfers, gait and functional mobility.   Pt will benefit from skilled therapeutic intervention in order to improve on the following deficits Abnormal gait;Decreased balance;Decreased mobility;Decreased safety awareness;Decreased coordination;Difficulty walking;Impaired flexibility;Postural  dysfunction   Rehab Potential Good   PT Frequency 2x / week   PT Duration 8 weeks  plus eval   PT Treatment/Interventions ADLs/Self Care Home Management;Therapeutic exercise;Therapeutic activities;Functional mobility training;Gait training;DME Instruction;Balance training;Neuromuscular re-education;Patient/family education   PT Next Visit Plan Review HEP given this visit, bed mobility review, continue to work torwards balance, weigthshifting and step strategies   Consulted and Agree with Plan of Care Patient  Problem List Patient Active Problem List   Diagnosis Date Noted  . 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 Depression 11/22/2013  . 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 07/31/2015, 10:13 PM Gean Maidens., PT [facility] [address] [place], [place], [address] Phone: [phone]   Fax:  [phone]  Name: Elizabeth Mcintosh MRN: [id number] Date of Birth: 1946/06/05

## 2015-07-31 NOTE — Patient Instructions (Addendum)
Lower Trunk Rotation Stretch    Keeping back flat and feet together, rotate knees to left side. Hold _10___ seconds. Repeat _3___ times per set.  Do __1-2__ sessions per day.  You can also do this as a gentle rocking, side to side motion (Use this motion to start to bring yourself up to sitting on the side of the bed)  http://orth.exer.us/123   Copyright  VHI. All rights reserved.  Knee-to-Chest Stretch: Unilateral    With hand behind right knee, pull knee in to chest until a comfortable stretch is felt in lower back and buttocks. Keep back relaxed. Hold _15-30___ seconds. Repeat ___3_ times per set. Do __1-2__ sessions per day.  http://orth.exer.us/127   Copyright  VHI. All rights reserved.  Hip Flexion - Supine    Lying on back, knees bent, feet on floor, bend hips, bringing knees toward trunk.  Alternate side to side, as in marching.  Repeat _10__ times. Do _1-2_ times per day.  Copyright  VHI. All rights reserved.  Sleeping on Back    Place pillow under knees. A pillow with cervical support and a roll around waist are also helpful.   Copyright  VHI. All rights reserved.

## 2015-07-31 NOTE — Therapy (Signed)
[facility] [address] [place], [place], [address] Phone: [phone]   Fax:  [phone]  Speech Language Pathology Treatment  Patient Details  Name: Elizabeth Mcintosh MRN: [id number] Date of Birth: 09/02/1945 Referring Provider: Dr. Lurena Joiner Tat  Encounter Date: 07/31/2015      End of Session - 07/31/15 1008    Visit Number 2   Number of Visits 17   Date for SLP Re-Evaluation 09/06/15   Authorization Type UHC - Berkley Harvey requested   SLP Start Time [phone]   SLP Stop Time  1013   SLP Time Calculation (min) 40 min      Past Medical History  Diagnosis Date  . Aneurysm (HCC)     s/p clips  . Glaucoma   . Parkinson's disease (HCC)   . Sleep apnea   . Hypotension     Past Surgical History  Procedure Laterality Date  . Craniotomy    . Aneurym clipping      There were no vitals filed for this visit.  Visit Diagnosis: Dysarthria      Subjective Assessment - 07/31/15 0932    Subjective "I'm froggy today"               ADULT SLP TREATMENT - 07/31/15 0933    General Information   Behavior/Cognition Alert;Cooperative;Pleasant mood   Pain Assessment   Pain Assessment No/denies pain   Cognitive-Linquistic Treatment   Treatment focused on Dysarthria   Skilled Treatment Loud /a/ to re-calibrate loudness with usual mod A for breath support for volume - average 85dB.  Faciliated loud volume with breath support  with structured speech tasks with usual min verbal  and visual cues average 70dB. Simple conversation with usual min cues for loudness average 70dB   Assessment / Recommendations / Plan   Plan Continue with current plan of care   Progression Toward Goals   Progression toward goals Progressing toward goals          SLP Education - 07/31/15 0959    Education provided Yes   Education Details breath support for volume   Person(s) Educated Patient   Methods Explanation;Demonstration;Handout;Verbal cues    Comprehension Verbalized understanding;Returned demonstration          SLP Short Term Goals - 07/31/15 1007    SLP SHORT TERM GOAL #1   Title Pt will demonstrate loud /a/ of 85 dB over 3 sessions with rare min A   Time 3   Period Weeks   Status On-going   SLP SHORT TERM GOAL #2   Title Pt will average 70dB during structured speech tasks with rare min A   Time 3   Period Weeks   Status On-going   SLP SHORT TERM GOAL #3   Title Pt will average 70dB over 5 minute conversation with rare min A   Time 3   Period Weeks   Status On-going          SLP Long Term Goals - 07/31/15 1008    SLP LONG TERM GOAL #1   Title Pt will maintain average of 70dB over 8 munute conversation with occasional min A   Time 7   Period Weeks   Status New   SLP LONG TERM GOAL #2   Title Pt will speak audibly in noisy environment during 8 minute conversation with rare min A   Time 7   Period Weeks   Status New   SLP LONG TERM GOAL #3   Time  7          Plan - 07/31/15 1001    Clinical Impression Statement Pt required usual min to mod A for breath support to maintain adequate volume during structured tasks and simple conversation. Continue skilled ST to maximize intellgibility.    Treatment/Interventions Compensatory techniques;Internal/external aids;SLP instruction and feedback;Functional tasks;Patient/family education   Potential to Achieve Goals Good   Potential Considerations Severity of impairments   Consulted and Agree with Plan of Care Patient        Problem List Patient Active Problem List   Diagnosis Date Noted  . 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 Depression 11/22/2013  . 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 [place], [place], [address] Phone: [phone]   Fax:  [phone]   Name: Elizabeth Mcintosh MRN:  [id number] Date of Birth: Nov 28, 1945

## 2015-07-31 NOTE — Patient Instructions (Signed)
Continue loud /a/   Do similarities/differences aloud

## 2015-08-01 ENCOUNTER — Ambulatory Visit: Payer: Medicare Other

## 2015-08-01 ENCOUNTER — Ambulatory Visit: Payer: Medicare Other | Admitting: Occupational Therapy

## 2015-08-01 ENCOUNTER — Ambulatory Visit: Payer: Medicare Other | Admitting: Physical Therapy

## 2015-08-01 VITALS — BP 80/50

## 2015-08-01 DIAGNOSIS — R471 Dysarthria and anarthria: Secondary | ICD-10-CM | POA: Diagnosis not present

## 2015-08-01 DIAGNOSIS — R2681 Unsteadiness on feet: Secondary | ICD-10-CM

## 2015-08-01 DIAGNOSIS — R293 Abnormal posture: Secondary | ICD-10-CM

## 2015-08-01 DIAGNOSIS — R258 Other abnormal involuntary movements: Secondary | ICD-10-CM

## 2015-08-01 DIAGNOSIS — R279 Unspecified lack of coordination: Secondary | ICD-10-CM

## 2015-08-01 DIAGNOSIS — R29898 Other symptoms and signs involving the musculoskeletal system: Secondary | ICD-10-CM

## 2015-08-01 DIAGNOSIS — R2689 Other abnormalities of gait and mobility: Secondary | ICD-10-CM

## 2015-08-01 NOTE — Patient Instructions (Signed)
  Do loud "ah" 5 times, twice a day.

## 2015-08-01 NOTE — Therapy (Signed)
[facility] [address] [place], [place], [address] Phone: [phone]   Fax:  [phone]  Speech Language Pathology Treatment  Patient Details  Name: Elizabeth Mcintosh MRN: [id number] Date of Birth: Feb 04, 1946 Referring Provider: Dr. Lurena Joiner Tat  Encounter Date: 08/01/2015      End of Session - 08/01/15 1356    Visit Number 3   Number of Visits 17   Date for SLP Re-Evaluation 09/06/15   SLP Start Time 1103   SLP Stop Time  1145   SLP Time Calculation (min) 42 min   Activity Tolerance Patient tolerated treatment well      Past Medical History  Diagnosis Date  . Aneurysm (HCC)     s/p clips  . Glaucoma   . Parkinson's disease (HCC)   . Sleep apnea   . Hypotension     Past Surgical History  Procedure Laterality Date  . Craniotomy    . Aneurym clipping      There were no vitals filed for this visit.  Visit Diagnosis: Dysarthria      Subjective Assessment - 08/01/15 1112    Subjective "I only did one set yesterday."   Currently in Pain? No/denies               ADULT SLP TREATMENT - 08/01/15 1113    General Information   Behavior/Cognition Alert;Cooperative;Pleasant mood   Cognitive-Linquistic Treatment   Treatment focused on Dysarthria   Skilled Treatment Recalibration of proper conversational loudness done by loud /a/ today with average 85dB and usual min A for loudness. Pt with reduced vocal quality approx half of the time requiring min-mod A occasionally for abdominal push. In picture descrtiption tasks pt req'd occasional mod A for loudness and abdominal push; and usual mod A  for loudness and abdominal push with descriptions >1 sentence. Overall loudness average 68dB. Conversation re: pt's family was average 67dB.    Assessment / Recommendations / Plan   Plan Continue with current plan of care   Progression Toward Goals   Progression toward goals Progressing toward goals          SLP  Education - 08/01/15 1355    Education provided Yes   Education Details why loud /a/ is necessary   Person(s) Educated Patient   Methods Explanation   Comprehension Verbalized understanding;Need further instruction          SLP Short Term Goals - 08/01/15 1357    SLP SHORT TERM GOAL #1   Title Pt will demonstrate loud /a/ of 85 dB over 3 sessions with rare min A   Baseline 2 sessions 08-01-15   Time 3   Period Weeks   Status On-going   SLP SHORT TERM GOAL #2   Title Pt will average 70dB during structured speech tasks with rare min A   Time 3   Period Weeks   Status On-going   SLP SHORT TERM GOAL #3   Title Pt will average 70dB over 5 minute conversation with rare min A   Time 3   Period Weeks   Status On-going          SLP Long Term Goals - 08/01/15 1357    SLP LONG TERM GOAL #1   Title Pt will maintain average of 70dB over 8 munute conversation with occasional min A   Time 7   Period Weeks   Status New   SLP LONG TERM GOAL #2   Title Pt will speak audibly  in noisy environment during 8 minute conversation with rare min A   Time 7   Period Weeks   Status New   SLP LONG TERM GOAL #3   Time 7          Plan - 08/01/15 1356    Clinical Impression Statement Pt required usual min to mod A for breath support to maintain adequate volume during structured tasks and simple conversation. Continue skilled ST to maximize intellgibility.    Speech Therapy Frequency 2x / week   Duration --  7 weeks   Treatment/Interventions Compensatory techniques;Internal/external aids;SLP instruction and feedback;Functional tasks;Patient/family education   Potential to Achieve Goals Good   Potential Considerations Severity of impairments   Consulted and Agree with Plan of Care Patient        Problem List Patient Active Problem List   Diagnosis Date Noted  . 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 Depression 11/22/2013  . 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 [place], [place], [address] Phone: [phone]   Fax:  [phone]   Name: KEEANA PIERATT MRN: [id number] Date of Birth: [birth year]

## 2015-08-02 NOTE — Therapy (Signed)
[facility] [address] [place], [place], [address] Phone: [phone]   Fax:  [phone]  Occupational Therapy Treatment  Patient Details  Name: Elizabeth Mcintosh MRN: [id number] Date of Birth: 08/31/1945 No Data Recorded  Encounter Date: 08/01/2015      OT End of Session - 08/01/15 1213    Visit Number 3   Number of Visits 17   Date for OT Re-Evaluation 09/08/15   Authorization Type UHC Medicare, no auth, no visit limit, G-code needed   Authorization - Visit Number 3   Authorization - Number of Visits 10   OT Start Time 1150   OT Stop Time 1230   OT Time Calculation (min) 40 min   Activity Tolerance Patient tolerated treatment well   Behavior During Therapy [facility] for tasks assessed/performed      Past Medical History  Diagnosis Date  . Aneurysm (HCC)     s/p clips  . Glaucoma   . Parkinson's disease (HCC)   . Sleep apnea   . Hypotension     Past Surgical History  Procedure Laterality Date  . Craniotomy    . Aneurym clipping      Filed Vitals:   08/01/15 1154  BP: 80/50    Visit Diagnosis:  Rigidity  Lack of coordination  Abnormal posture  Decreased functional mobility      Subjective Assessment - 08/01/15 1209    Subjective  Pt reports    Pertinent History Parkinsonism diagnosis 2015, MSA diagnosis 2016, hx of aneurysm clipping, glaucoma, orthostatic hypotension, hx of multiple falls   Patient Stated Goals improve coordination, ROM, ADLs          Therapist monitored BP as it was low during session.  Functional reaching to copy small peg design on vertical surface at mid to high level, min v.c. For correct design, min-mod difficulty for coordination                  [facility]) - 08/01/15 1215    PWR! exercises Moves in sitting   PWR! Up 20   PWR! Rock 20   PWR! Twist 20   PWR! Step 10   Comments min v.c. for larger amplitude movments               OT Short  Term Goals - 07/12/15 1654    OT SHORT TERM GOAL #1   Title Pt will be independent with updated HEP.--ck 09/09/15   Time 4   Period Weeks   Status New   OT SHORT TERM GOAL #2   Title Pt will write at least 3 sentences with 100% legibility and only min decr in size.    Time 4   Period Weeks   Status New   OT SHORT TERM GOAL #3   Title Pt will demo at least 125* R shoulder flex for functional reaching with -20* elbow extension or less.   Baseline 120*   Time 4   Period Weeks   Status New   OT SHORT TERM GOAL #4   Title Pt will improve ability/ease with donning/doffing jacket as shown by improving time on PPT#4 by at least 8sec.   Baseline 55.41sec   Time 4   Period Weeks   Status New           OT Long Term Goals - 07/12/15 1656    OT LONG TERM GOAL #1   Title Pt will verbalize understanding of updated AE/strategies to increase  ease/independence with ADLs/IADLs prn.--09/09/15   Time 8   Period Weeks   Status New   OT LONG TERM GOAL #2   Title Pt will improve coordination/functional reaching for ADLs as shown by improving score on box and blocks test by at least 5 blocks bilaterally.   Baseline R-38 blocks, L-38 blocks   Time 8   Period Weeks   Status New   OT LONG TERM GOAL #3   Title Pt will demo at least 130* R shoulder flex for functional reaching.   Baseline 120*   Time 8   Period Weeks   Status New   OT LONG TERM GOAL #4   Title Pt will improve coordination for ADLs as shown by improving time on 9-hole peg test by at least 5 sec with RUE.   Baseline R-42.94sec   Time 8   Period Weeks   Status New   OT LONG TERM GOAL #5   Title Pt will improve ability/ease with donning/doffing jacket as shown by improving time on PPT#4 by at least 15sec.   Baseline 55.41sec   Time 8   Period Weeks   Status New               Plan - 08/01/15 1211    Clinical Impression Statement Pt is progressing towards goals for coordination and HEP. Pt returns demonstration PWR!  exercises in seated with min v.c.   Pt will benefit from skilled therapeutic intervention in order to improve on the following deficits (Retired) Decreased coordination;Decreased activity tolerance;Impaired tone;Impaired UE functional use;Decreased mobility;Decreased balance;Decreased range of motion   Rehab Potential Good   OT Frequency 2x / week   OT Duration 8 weeks   OT Treatment/Interventions Self-care/ADL training;Neuromuscular education;Building services engineer;Therapeutic exercise   Plan monitor BP, adapted strategies for ADLS/IADLS   Consulted and Agree with Plan of Care Patient        Problem List Patient Active Problem List   Diagnosis Date Noted  . 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 Depression 11/22/2013  . 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 [place], [place], [address] Phone: [phone]   Fax:  [phone]  Name: Elizabeth Mcintosh MRN: [id number] Date of Birth: Jul 30, 1945

## 2015-08-02 NOTE — Therapy (Signed)
[facility] [address] [place], [place], [address] Phone: [phone]   Fax:  [phone]  Physical Therapy Treatment  Patient Details  Name: Elizabeth Mcintosh MRN: [id number] Date of Birth: 08-Jan-1946 Referring Provider: Lurena Joiner Tat  Encounter Date: 08/01/2015      PT End of Session - 08/02/15 1642    Visit Number 4   Number of Visits 17   Date for PT Re-Evaluation 09/11/15   Authorization Type UHC Medicare-Gcode every 10th visit   PT Start Time 1024   PT Stop Time 1102   PT Time Calculation (min) 38 min   Activity Tolerance Patient tolerated treatment well   Behavior During Therapy [facility] for tasks assessed/performed      Past Medical History  Diagnosis Date  . Aneurysm (HCC)     s/p clips  . Glaucoma   . Parkinson's disease (HCC)   . Sleep apnea   . Hypotension     Past Surgical History  Procedure Laterality Date  . Craniotomy    . Aneurym clipping      There were no vitals filed for this visit.  Visit Diagnosis:  Bradykinesia  Decreased functional mobility  Unsteadiness  Rigidity      Subjective Assessment - 08/01/15 1026    Subjective Slept better last night.  I used a small pillow under my knees, but didn't notice much difference.  Did use the bed mobility tips.   Patient Stated Goals Pt's goal for therapy is to get a little more steady with walking.   Currently in Pain? No/denies                         [facility] Adult PT Treatment/Exercise - 08/01/15 1028    Bed Mobility   Bed Mobility Sit to Supine;Right Sidelying to Sit;Rolling Right  HOB elevated to simulate home   Right Sidelying to Sit 4: Min guard;HOB elevated  cues for momentum to initiate   Sit to Supine 5: Supervision   Sit to Supine - Details (indicate cue type and reason) 3 reps supin<>sit with cues for momentum for ease of mobility   High Level Balance   High Level Balance Activities --  Quarter turns R and L,  once each with UE support   High Level Balance Comments At counter, practiced wide BOS lateral weightshifting, then stagger stance forward/back weigthshifting to address improved standing balance and safety, at least 10 reps each exercise with min guard assistance and UE support.  Step and weigthshift side x 10 reps, then step and weigthshift back x 10 reps with UE support.  Forward/back walking at counter with UE support, with cues for slowed pacing/wide BOS of backwards walking to improve safety with short distance retro gait.   Lumbar Exercises: Stretches   Single Knee to Chest Stretch 3 reps;20 seconds  review of HEP   Lower Trunk Rotation 3 reps;20 seconds  Review of HEP   Lumbar Exercises: Supine   Other Supine Lumbar Exercises In hooklying position:  marching in place x 10 reps  Review of HEP     Pt demonstrates understanding of HEP provided last visit.  After standing exercise, pt c/o dizziness with turning.  Automatic blood pressure reading 57/36.  Manual BP reading 78/50, with pt having decreased/no c/o dizziness in sitting.             PT Short Term Goals - 07/13/15 1724    PT SHORT TERM GOAL #1  Title Pt will perform HEP with family supervision for improved balance, transfers, and gait.  TARGET 08/11/15   Time 4   Period Weeks   Status New   PT SHORT TERM GOAL #2   Title Pt will perform transfers from 18" surfaces and below with minimal use of hands, using proper and safe technique, 8 of 10 trials, for improved transfer safety.   Time 4   Period Weeks   Status New   PT SHORT TERM GOAL #3   Title Pt will improve 4-square step test to less than 14 seconds with no loss of balance.   Time 4   Period Weeks   Status New   PT SHORT TERM GOAL #4   Title Pt will verbalize understanding of fall prevention within the home environment.   Time 4   Period Weeks   Status New           PT Long Term Goals - 07/13/15 1726    PT LONG TERM GOAL #1   Title Pt will improve  Mini-Bestest score to at least 22/28 for decreased fall risk.   Time 8   Period Weeks   Status New   PT LONG TERM GOAL #2   Title Pt will demonstrate proper, safe technique for pushing/pulling chairs away from table, modified independently, 4 of 5 trials.   Time 8   Period Weeks   Status New   PT LONG TERM GOAL #3   Title Pt will ambulate at least 500 ft with appropriate assistive device modified independently, without loss of balance.   Time 8   Period Weeks   Status New   PT LONG TERM GOAL #4   Title Pt will perform floor>stand transfer with minimal UE support, modified independently, for safe fall recovery.               Plan - 08/02/15 1642    Clinical Impression Statement Pt appears to have improved functional mobility performance after sustained practice in therapy sessions.  Today's session focused briefly on bed mobility and exercise review, then standing exercise and weigthshifting at counter.  With practice for turning, pt c/o dizziness and noted to have low blood pressure near end ofsession.  Pt will continue to benefit from further skilled PT to address balance, trasnfers, gait and functional moblity.   Pt will benefit from skilled therapeutic intervention in order to improve on the following deficits Abnormal gait;Decreased balance;Decreased mobility;Decreased safety awareness;Decreased coordination;Difficulty walking;Impaired flexibility;Postural dysfunction   Rehab Potential Good   PT Frequency 2x / week   PT Duration 8 weeks  plus eval   PT Treatment/Interventions ADLs/Self Care Home Management;Therapeutic exercise;Therapeutic activities;Functional mobility training;Gait training;DME Instruction;Balance training;Neuromuscular re-education;Patient/family education   PT Next Visit Plan continue to work torwards balance, weigthshifting and step strategies, turning activities; car transfers as able   Consulted and Agree with Plan of Care Patient        Problem  List Patient Active Problem List   Diagnosis Date Noted  . 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 Depression 11/22/2013  . 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 08/02/2015, 4:47 PM Gean Maidens., PT [facility] [address] [place], [place], [address] Phone: [phone]   Fax:  [phone]  Name: Elizabeth Mcintosh MRN: [id number] Date of Birth: 1945-10-26

## 2015-08-07 ENCOUNTER — Ambulatory Visit: Payer: Medicare Other | Admitting: Physical Therapy

## 2015-08-07 ENCOUNTER — Ambulatory Visit: Payer: Medicare Other | Admitting: Occupational Therapy

## 2015-08-07 ENCOUNTER — Ambulatory Visit: Payer: Medicare Other | Admitting: Speech Pathology

## 2015-08-07 DIAGNOSIS — R471 Dysarthria and anarthria: Secondary | ICD-10-CM | POA: Diagnosis not present

## 2015-08-07 DIAGNOSIS — R258 Other abnormal involuntary movements: Secondary | ICD-10-CM

## 2015-08-07 DIAGNOSIS — R279 Unspecified lack of coordination: Secondary | ICD-10-CM

## 2015-08-07 DIAGNOSIS — R29898 Other symptoms and signs involving the musculoskeletal system: Secondary | ICD-10-CM

## 2015-08-07 DIAGNOSIS — R269 Unspecified abnormalities of gait and mobility: Secondary | ICD-10-CM

## 2015-08-07 NOTE — Therapy (Signed)
[facility] [address] [place], [place], [address] Phone: [phone]   Fax:  [phone]  Occupational Therapy Treatment  Patient Details  Name: Elizabeth Mcintosh MRN: [id number] Date of Birth: 09-01-1945 No Data Recorded  Encounter Date: 08/07/2015      OT End of Session - 08/07/15 1120    Visit Number 4   Number of Visits 17   Date for OT Re-Evaluation 09/08/15   Authorization Type UHC Medicare, no auth, no visit limit, G-code needed   Authorization - Visit Number 4   Authorization - Number of Visits 10   OT Start Time 1104   OT Stop Time 1145   OT Time Calculation (min) 41 min   Activity Tolerance Patient tolerated treatment well   Behavior During Therapy [facility] for tasks assessed/performed      Past Medical History  Diagnosis Date  . Aneurysm (HCC)     s/p clips  . Glaucoma   . Parkinson's disease (HCC)   . Sleep apnea   . Hypotension     Past Surgical History  Procedure Laterality Date  . Craniotomy    . Aneurym clipping      There were no vitals filed for this visit.  Visit Diagnosis:  Bradykinesia  Rigidity  Lack of coordination      Subjective Assessment - 08/07/15 1107    Subjective  Nothing new   Pertinent History Parkinsonism diagnosis 2015, MSA diagnosis 2016, hx of aneurysm clipping, glaucoma, orthostatic hypotension, hx of multiple falls   Patient Stated Goals improve coordination, ROM, ADLs   Currently in Pain? No/denies                              OT Education - 08/07/15 1122    Education Details PWR! hands (basic 4);  Coordination HEP with focuse on large amplitude movements; Emphasized importance of coordination HEP as well    Person(s) Educated Patient   Methods Explanation;Demonstration;Tactile cues;Verbal cues;Handout   Comprehension Verbalized understanding;Returned demonstration;Verbal cues required;Need further instruction  mod cueing for large  amplitude movements, particularly with RUE          OT Short Term Goals - 07/12/15 1654    OT SHORT TERM GOAL #1   Title Pt will be independent with updated HEP.--ck 09/09/15   Time 4   Period Weeks   Status New   OT SHORT TERM GOAL #2   Title Pt will write at least 3 sentences with 100% legibility and only min decr in size.    Time 4   Period Weeks   Status New   OT SHORT TERM GOAL #3   Title Pt will demo at least 125* R shoulder flex for functional reaching with -20* elbow extension or less.   Baseline 120*   Time 4   Period Weeks   Status New   OT SHORT TERM GOAL #4   Title Pt will improve ability/ease with donning/doffing jacket as shown by improving time on PPT#4 by at least 8sec.   Baseline 55.41sec   Time 4   Period Weeks   Status New           OT Long Term Goals - 07/12/15 1656    OT LONG TERM GOAL #1   Title Pt will verbalize understanding of updated AE/strategies to increase ease/independence with ADLs/IADLs prn.--09/09/15   Time 8   Period Weeks   Status New   OT  LONG TERM GOAL #2   Title Pt will improve coordination/functional reaching for ADLs as shown by improving score on box and blocks test by at least 5 blocks bilaterally.   Baseline R-38 blocks, L-38 blocks   Time 8   Period Weeks   Status New   OT LONG TERM GOAL #3   Title Pt will demo at least 130* R shoulder flex for functional reaching.   Baseline 120*   Time 8   Period Weeks   Status New   OT LONG TERM GOAL #4   Title Pt will improve coordination for ADLs as shown by improving time on 9-hole peg test by at least 5 sec with RUE.   Baseline R-42.94sec   Time 8   Period Weeks   Status New   OT LONG TERM GOAL #5   Title Pt will improve ability/ease with donning/doffing jacket as shown by improving time on PPT#4 by at least 15sec.   Baseline 55.41sec   Time 8   Period Weeks   Status New               Plan - 08/07/15 1120    Clinical Impression Statement Pt is progressing  towards goals for coordination, but needs mod cueing for large amplitude movements, particulary with R hand (pt due for meds at noon today).  Pt also easily distracted in gym today which affected amplitude.   Plan monitor BP prn, strategies for ADLs/IADs   OT Home Exercise Plan Education issued:  PWR! hands HEP, Coordination HEP 08/07/15   Consulted and Agree with Plan of Care Patient        Problem List Patient Active Problem List   Diagnosis Date Noted  . 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 Depression 11/22/2013  . 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 [place], [place], [address] Phone: [phone]   Fax:  [phone]  Name: Elizabeth Mcintosh MRN: [id number] Date of Birth: 12-Mar-1946  Willa Frater, OTR/L [facility] [address] [place], [place]  [address] [phone] phone [phone] 08/07/2015 11:54 AM

## 2015-08-07 NOTE — Therapy (Signed)
[facility] [address] [place], [place], [address] Phone: [phone]   Fax:  [phone]  Physical Therapy Treatment  Patient Details  Name: Elizabeth Mcintosh MRN: [id number] Date of Birth: 11-Dec-1945 Referring Provider: Lurena Joiner Tat  Encounter Date: 08/07/2015      PT End of Session - 08/07/15 1548    Visit Number 5   Number of Visits 17   Date for PT Re-Evaluation 09/11/15   Authorization Type UHC Medicare-Gcode every 10th visit   PT Start Time 0934   PT Stop Time 1014   PT Time Calculation (min) 40 min   Equipment Utilized During Treatment Gait belt   Activity Tolerance Patient tolerated treatment well  No c/o dizziness/blood pressure issues this visit   Behavior During Therapy [facility] for tasks assessed/performed      Past Medical History  Diagnosis Date  . Aneurysm (HCC)     s/p clips  . Glaucoma   . Parkinson's disease (HCC)   . Sleep apnea   . Hypotension     Past Surgical History  Procedure Laterality Date  . Craniotomy    . Aneurym clipping      There were no vitals filed for this visit.  Visit Diagnosis:  Rigidity  Bradykinesia  Abnormality of gait      Subjective Assessment - 08/07/15 0935    Subjective PIllows under my knees did help out with sleeping-not as much discomfort.   Patient Stated Goals Pt's goal for therapy is to get a little more steady with walking.   Currently in Pain? No/denies                         [facility] Adult PT Treatment/Exercise - 08/07/15 0940    Transfers   Transfers Sit to Stand;Stand to Sit   Sit to Stand 5: Supervision;Without upper extremity assist;From elevated surface;From chair/3-in-1;4: Min guard   Stand to Sit 5: Supervision;4: Min guard;Without upper extremity assist;To elevated surface;To chair/3-in-1   Number of Reps 10 reps;Other sets (comment)  from 22", 18", then 16" surfaces   Transfer Cueing Cues for initial increased forward  lean to avoid posterior pull upon standing   High Level Balance   High Level Balance Activities Side stepping;Backward walking  4-5 reps each direction at counter with UE support   High Level Balance Comments At counter, practiced wide BOS lateral weightshifting, then stagger stance forward/back weigthshifting to address improved standing balance and safety, at least 10 reps each exercise with min guard assistance and UE support.  Step and weigthshift side x 10 reps, then step and weigthshift back x 10 reps with UE support.  Forward/back walking at counter with UE support, with cues for slowed pacing/wide BOS of backwards walking to improve safety with short distance retro gait.   Self-Care   Self-Care Other Self-Care Comments   Other Self-Care Comments  Seated circulation exercises; discussed ease of donning/doffing compression stockings with husband assist    Seated exercises including ankle pumps x 10, marching x 10, and LAQ x 10 reps to be performed after sitting long periods in attempts to avoid quick blood pressure changes upon standing.            PT Education - 08/07/15 1547    Education provided Yes   Education Details tips for improved ease of donning/doffing compression stockings; seated leg exercises to complete prior to standing to help to alleviate quick changes in blood pressure upon standing  Person(s) Educated Patient   Methods Explanation;Demonstration   Comprehension Verbalized understanding;Returned demonstration          PT Short Term Goals - 07/13/15 1724    PT SHORT TERM GOAL #1   Title Pt will perform HEP with family supervision for improved balance, transfers, and gait.  TARGET 08/11/15   Time 4   Period Weeks   Status New   PT SHORT TERM GOAL #2   Title Pt will perform transfers from 18" surfaces and below with minimal use of hands, using proper and safe technique, 8 of 10 trials, for improved transfer safety.   Time 4   Period Weeks   Status New   PT  SHORT TERM GOAL #3   Title Pt will improve 4-square step test to less than 14 seconds with no loss of balance.   Time 4   Period Weeks   Status New   PT SHORT TERM GOAL #4   Title Pt will verbalize understanding of fall prevention within the home environment.   Time 4   Period Weeks   Status New           PT Long Term Goals - 07/13/15 1726    PT LONG TERM GOAL #1   Title Pt will improve Mini-Bestest score to at least 22/28 for decreased fall risk.   Time 8   Period Weeks   Status New   PT LONG TERM GOAL #2   Title Pt will demonstrate proper, safe technique for pushing/pulling chairs away from table, modified independently, 4 of 5 trials.   Time 8   Period Weeks   Status New   PT LONG TERM GOAL #3   Title Pt will ambulate at least 500 ft with appropriate assistive device modified independently, without loss of balance.   Time 8   Period Weeks   Status New   PT LONG TERM GOAL #4   Title Pt will perform floor>stand transfer with minimal UE support, modified independently, for safe fall recovery.               Plan - 08/07/15 1549    Clinical Impression Statement Pt continues to note improved functional mobility as well as improved ease of performance of counter balance activities, including backwards walking.  Pt does continue to need occasional cues for proper, safe technique with transfers to avoid posterior lean upon standing and with slowed descent upon sitting.  Pt will continue to benefit from further skilled PT to address balance, transfers, gait and functional mobility.   Pt will benefit from skilled therapeutic intervention in order to improve on the following deficits Abnormal gait;Decreased balance;Decreased mobility;Decreased safety awareness;Decreased coordination;Difficulty walking;Impaired flexibility;Postural dysfunction   Rehab Potential Good   PT Frequency 2x / week   PT Duration 8 weeks  plus eval   PT Treatment/Interventions ADLs/Self Care Home  Management;Therapeutic exercise;Therapeutic activities;Functional mobility training;Gait training;DME Instruction;Balance training;Neuromuscular re-education;Patient/family education   PT Next Visit Plan continue to work torwards balance, weigthshifting and step strategies, (may add to HEP);  turning activities; car transfers as able; NEED to CHECK STGs   Consulted and Agree with Plan of Care Patient        Problem List Patient Active Problem List   Diagnosis Date Noted  . 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 Depression 11/22/2013  . 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 08/07/2015, 3:53 PM  Gean Maidens., PT  [facility] [address]  [place], [place], [address] Phone: [phone]   Fax:  [phone]  Name: Elizabeth Mcintosh MRN: [id number] Date of Birth: [birth year]

## 2015-08-07 NOTE — Patient Instructions (Signed)
   Continue deep breathing and abdominal push to get loud  Loud /a/ 5x twice a day  Practice conversation with focus on loud voice 5 - 10 minutes a day - think loud, use big breath

## 2015-08-07 NOTE — Patient Instructions (Addendum)
PWR! Hands  With arms stretched out in front of you (elbows straight), perform the following:  PWR! Rock: Move wrists up and down [employer]! Twist: Twist palms up and down BIG  Then, start with elbows bent and hands closed.  PWR! Step: Touch index finger to thumb while keeping other fingers straight. Flick fingers out BIG (thumb out/straighten fingers). Repeat with other fingers. (Step your thumb to each finger).  PWR! Hands: Push hands out BIG. Elbows straight, wrists up, fingers open and spread apart BIG. (Can also perform by pushing down on table, chair, knees. Push above head, out to the side, behind you, in front of you.)   ** Make each movement big and deliberate so that you feel the movement.  Perform at least 10 repetitions 1x/day, but perform PWR! hands throughout the day when you are having trouble using your hands (picking up/manipulating small objects, writing, eating, typing, sewing, buttoning, etc.).   Coordination Exercises  Perform the following exercises for 20 minutes at least 4-5 times per day. Perform with both hand(s). Perform using big movements.   Flipping Cards: Place deck of cards on the table. Flip cards over by opening your hand big to grasp and then turn your palm up big.  Deal cards: Hold 1/2 or whole deck in your hand. Use thumb to push card off top of deck with one big push.  Rotate ball with fingertips: Pick up with fingers/thumb and move as much as you can with each turn/movement (clockwise and counter-clockwise).  Toss ball from one hand to the other: Toss big/high.  Toss ball in the air and catch with the same hand: Toss big/high.  Pick up coins and place in coin bank or container: Pick up with big, intentional movements. Do not drag coin to the edge.  Pick up 5-10 coins one at a time and hold in palm. Then, move coins from palm to fingertips one at time and place in coin bank/container.  Practice writing: Slow down, write big, and focus on  forming each letter.  Practice typing.  Fasten nuts/bolts or put on bottle caps: Turn as much/as big as you can with each turn.

## 2015-08-07 NOTE — Therapy (Signed)
[facility] [address] [place], [place], [address] Phone: [phone]   Fax:  [phone]  Speech Language Pathology Treatment  Patient Details  Name: Elizabeth Mcintosh MRN: [id number] Date of Birth: Apr 30, 1946 Referring Provider: Dr. Lurena Joiner Tat  Encounter Date: 08/07/2015      End of Session - 08/07/15 1103    Visit Number 4   Number of Visits 17   Date for SLP Re-Evaluation 09/06/15   Authorization Type UHC - Berkley Harvey requested   SLP Start Time 1016   SLP Stop Time  1102   SLP Time Calculation (min) 46 min   Activity Tolerance Patient tolerated treatment well      Past Medical History  Diagnosis Date  . Aneurysm (HCC)     s/p clips  . Glaucoma   . Parkinson's disease (HCC)   . Sleep apnea   . Hypotension     Past Surgical History  Procedure Laterality Date  . Craniotomy    . Aneurym clipping      There were no vitals filed for this visit.  Visit Diagnosis: Dysarthria      Subjective Assessment - 08/07/15 1020    Subjective "I did some /a/'s this morning"               ADULT SLP TREATMENT - 08/07/15 1020    General Information   Behavior/Cognition Alert;Cooperative;Pleasant mood   Pain Assessment   Pain Assessment No/denies pain   Cognitive-Linquistic Treatment   Treatment focused on Dysarthria   Skilled Treatment Loud /a/ to recalibrate loudness for conversation average of 86dB after initial ST modeling - oral reading also to facilitate loudness for structured speech tasks - average 71 dB with rare min A. Structured speech tasks generating story for an action photo with average 68dB with usual min cues - for abdmonimal breathing/push for loudness average 68dB.   Assessment / Recommendations / Plan   Plan Continue with current plan of care   Progression Toward Goals   Progression toward goals Progressing toward goals            SLP Short Term Goals - 08/07/15 1101    SLP SHORT TERM  GOAL #1   Title Pt will demonstrate loud /a/ of 85 dB over 3 sessions with rare min A   Baseline 3 sessions 08/07/15   Time 3   Period Weeks   Status Achieved   SLP SHORT TERM GOAL #2   Title Pt will average 70dB during structured speech tasks with rare min A   Time 2   Period Weeks   Status On-going   SLP SHORT TERM GOAL #3   Title Pt will average 70dB over 5 minute conversation with rare min A   Time 2   Period Weeks   Status On-going          SLP Long Term Goals - 08/07/15 1102    SLP LONG TERM GOAL #1   Title Pt will maintain average of 70dB over 8 munute conversation with occasional min A   Time 5   Period Weeks   Status New   SLP LONG TERM GOAL #2   Title Pt will speak audibly in noisy environment during 8 minute conversation with rare min A   Time 5   Period Weeks   Status New   SLP LONG TERM GOAL #3   Time --   Status Revised          Plan - 08/07/15  1101    Clinical Impression Statement Pt required usual min to mod A for breath support to maintain adequate volume during structured tasks and simple conversation. Continue skilled ST to maximize intellgibility.    Duration 2 weeks   Treatment/Interventions Compensatory techniques;Internal/external aids;SLP instruction and feedback;Functional tasks;Patient/family education   Potential to Achieve Goals Good   Potential Considerations Severity of impairments   Consulted and Agree with Plan of Care Patient        Problem List Patient Active Problem List   Diagnosis Date Noted  . 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 Depression 11/22/2013  . 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 [place], [place], [address] Phone: [phone]   Fax:  [phone]   Name: Elizabeth Mcintosh MRN: [id number] Date of Birth: 1946/01/21

## 2015-08-09 ENCOUNTER — Ambulatory Visit: Payer: Medicare Other | Admitting: Speech Pathology

## 2015-08-09 ENCOUNTER — Ambulatory Visit: Payer: Medicare Other | Admitting: Occupational Therapy

## 2015-08-09 ENCOUNTER — Encounter: Payer: Self-pay | Admitting: Occupational Therapy

## 2015-08-09 ENCOUNTER — Ambulatory Visit: Payer: Medicare Other | Attending: Neurology | Admitting: Physical Therapy

## 2015-08-09 DIAGNOSIS — R29898 Other symptoms and signs involving the musculoskeletal system: Secondary | ICD-10-CM

## 2015-08-09 DIAGNOSIS — R293 Abnormal posture: Secondary | ICD-10-CM | POA: Diagnosis present

## 2015-08-09 DIAGNOSIS — R2681 Unsteadiness on feet: Secondary | ICD-10-CM

## 2015-08-09 DIAGNOSIS — M2560 Stiffness of unspecified joint, not elsewhere classified: Secondary | ICD-10-CM | POA: Diagnosis present

## 2015-08-09 DIAGNOSIS — R471 Dysarthria and anarthria: Secondary | ICD-10-CM | POA: Insufficient documentation

## 2015-08-09 DIAGNOSIS — R279 Unspecified lack of coordination: Secondary | ICD-10-CM

## 2015-08-09 DIAGNOSIS — R269 Unspecified abnormalities of gait and mobility: Secondary | ICD-10-CM | POA: Insufficient documentation

## 2015-08-09 DIAGNOSIS — R2689 Other abnormalities of gait and mobility: Secondary | ICD-10-CM | POA: Diagnosis present

## 2015-08-09 DIAGNOSIS — R258 Other abnormal involuntary movements: Secondary | ICD-10-CM | POA: Insufficient documentation

## 2015-08-09 NOTE — Therapy (Signed)
[facility] [address] [place], [place], [address] Phone: [phone]   Fax:  [phone]  Speech Language Pathology Treatment  Patient Details  Name: ZOIEE WIMMER MRN: [id number] Date of Birth: 1945-07-07 Referring Provider: Dr. Lurena Joiner Tat  Encounter Date: 08/09/2015      End of Session - 08/09/15 1318    Visit Number 5   Number of Visits 17   Date for SLP Re-Evaluation 09/06/15   Authorization Type UHC - Berkley Harvey requested   SLP Start Time 1233   SLP Stop Time  1315   SLP Time Calculation (min) 42 min      Past Medical History  Diagnosis Date  . Aneurysm (HCC)     s/p clips  . Glaucoma   . Parkinson's disease (HCC)   . Sleep apnea   . Hypotension     Past Surgical History  Procedure Laterality Date  . Craniotomy    . Aneurym clipping      There were no vitals filed for this visit.  Visit Diagnosis: Dysarthria      Subjective Assessment - 08/09/15 1236    Subjective "I have been practicing loud conversation with my husband - I haven't heard as many whats"   Currently in Pain? No/denies               ADULT SLP TREATMENT - 08/09/15 1240    General Information   Behavior/Cognition Alert;Cooperative;Pleasant mood   Pain Assessment   Pain Assessment No/denies pain   Cognitive-Linquistic Treatment   Treatment focused on Dysarthria   Skilled Treatment Loud /a/ to recalibrate loudness average 86dB with occasional cues for abdominal push and loudness.. Structures speech tasks verbally sequencing steps of ADL's average of 68dB with occasional min verbal cues. Simple conversation over 8 minutes average if 70dB with occasoinall min cues for breath support and loud voice.   Assessment / Recommendations / Plan   Plan Continue with current plan of care   Progression Toward Goals   Progression toward goals Progressing toward goals            SLP Short Term Goals - 08/09/15 1317    SLP SHORT  TERM GOAL #1   Title Pt will demonstrate loud /a/ of 85 dB over 3 sessions with rare min A   Baseline 3 sessions 08/07/15   Time 3   Period Weeks   Status Achieved   SLP SHORT TERM GOAL #2   Title Pt will average 70dB during structured speech tasks with rare min A   Time 2   Period Weeks   Status On-going   SLP SHORT TERM GOAL #3   Title Pt will average 70dB over 5 minute conversation with rare min A   Time 2   Period Weeks   Status On-going          SLP Long Term Goals - 08/09/15 1317    SLP LONG TERM GOAL #1   Title Pt will maintain average of 70dB over 8 munute conversation with occasional min A   Time 5   Period Weeks   Status New   SLP LONG TERM GOAL #2   Title Pt will speak audibly in noisy environment during 8 minute conversation with rare min A   Time 5   Period Weeks   Status On-going   SLP LONG TERM GOAL #3   Status Revised          Plan - 08/09/15 1312  Clinical Impression Statement Pt required occasional  min to mod A for breath support to maintain adequate volume during structured tasks and simple conversation. Continue skilled ST to maximize intellgibility.    Speech Therapy Frequency 2x / week   Duration 2 weeks   Treatment/Interventions Compensatory techniques;Internal/external aids;SLP instruction and feedback;Functional tasks;Patient/family education   Potential to Achieve Goals Good   Potential Considerations Severity of impairments   Consulted and Agree with Plan of Care Patient        Problem List Patient Active Problem List   Diagnosis Date Noted  . 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 Depression 11/22/2013  . 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 [place], [place], [address] Phone: [phone]   Fax:  [phone]   Name: LANITA STAMMEN MRN: [id number] Date of Birth:  21-Jun-1945

## 2015-08-09 NOTE — Therapy (Signed)
[facility] [address] [place], [place], [address] Phone: [phone]   Fax:  [phone]  Occupational Therapy Treatment  Patient Details  Name: Elizabeth Mcintosh MRN: [id number] Date of Birth: 1946-05-06 No Data Recorded  Encounter Date: 08/09/2015      OT End of Session - 08/09/15 1430    Visit Number 5   Number of Visits 17   Date for OT Re-Evaluation 09/08/15   Authorization Type UHC Medicare, no auth, no visit limit, G-code needed   Authorization - Visit Number 5   Authorization - Number of Visits 10   OT Start Time 1405   OT Stop Time 1445   OT Time Calculation (min) 40 min   Activity Tolerance Patient tolerated treatment well   Behavior During Therapy [facility] for tasks assessed/performed      Past Medical History  Diagnosis Date  . Aneurysm (HCC)     s/p clips  . Glaucoma   . Parkinson's disease (HCC)   . Sleep apnea   . Hypotension     Past Surgical History  Procedure Laterality Date  . Craniotomy    . Aneurym clipping      There were no vitals filed for this visit.  Visit Diagnosis:  Bradykinesia  Rigidity  Lack of coordination  Abnormal posture  Decreased functional mobility  Unsteadiness      Subjective Assessment - 08/09/15 1427    Subjective  Pt reports that she can't get compression hose on   Pertinent History Parkinsonism diagnosis 2015, MSA diagnosis 2016, hx of aneurysm clipping, glaucoma, orthostatic hypotension, hx of multiple falls   Patient Stated Goals improve coordination, ROM, ADLs   Currently in Pain? No/denies                      OT Treatments/Exercises (OP) - 08/09/15 0001    ADLs   UB Dressing Practiced donning/doffing jacket in standing with min cueing for large amplitde movement strategies.  Pt with improvement/incr ease with use of strategies.   Neurological Re-education Exercises   Other Exercises 1 In sitting, functional reaching with each UE  in ER and overhead to place large pegs in vertical pegboard incorporating trunk rotation and wt. shift.  Pt with min cueing for use of large amplitude movements/PWR! hands.             PWR [facility]) - 08/09/15 1540    PWR! exercises Moves in supine   PWR! Up 10   PWR! Rock 20   PWR! Twist 20   PWR! Step 20   Comments with min v.c. for larger amplitude movements              OT Education - 08/09/15 1554    Education Details stocking aid for compression hose; Recommendation for cross body bag/fanning pack for phone when home alone in case of a fall   Person(s) Educated Patient   Methods Explanation;Handout   Comprehension Verbalized understanding          OT Short Term Goals - 08/09/15 1556    OT SHORT TERM GOAL #1   Title Pt will be independent with updated HEP.--ck 08/09/15   Baseline --------   Time 4   Period Weeks   Status New   OT SHORT TERM GOAL #2   Title Pt will write at least 3 sentences with 100% legibility and only min decr in size.    Baseline --------   Time 4   Period  Weeks   Status New   OT SHORT TERM GOAL #3   Title Pt will demo at least 125* R shoulder flex for functional reaching with -20* elbow extension or less.   Baseline 120*   Time 4   Period Weeks   Status New   OT SHORT TERM GOAL #4   Title Pt will improve ability/ease with donning/doffing jacket as shown by improving time on PPT#4 by at least 8sec.   Baseline 55.41sec   Time 4   Period Weeks   Status New           OT Long Term Goals - 07/12/15 1656    OT LONG TERM GOAL #1   Title Pt will verbalize understanding of updated AE/strategies to increase ease/independence with ADLs/IADLs prn.--09/09/15   Time 8   Period Weeks   Status New   OT LONG TERM GOAL #2   Title Pt will improve coordination/functional reaching for ADLs as shown by improving score on box and blocks test by at least 5 blocks bilaterally.   Baseline R-38 blocks, L-38 blocks   Time 8   Period Weeks   Status New    OT LONG TERM GOAL #3   Title Pt will demo at least 130* R shoulder flex for functional reaching.   Baseline 120*   Time 8   Period Weeks   Status New   OT LONG TERM GOAL #4   Title Pt will improve coordination for ADLs as shown by improving time on 9-hole peg test by at least 5 sec with RUE.   Baseline R-42.94sec   Time 8   Period Weeks   Status New   OT LONG TERM GOAL #5   Title Pt will improve ability/ease with donning/doffing jacket as shown by improving time on PPT#4 by at least 15sec.   Baseline 55.41sec   Time 8   Period Weeks   Status New               Plan - 08/09/15 1533    Clinical Impression Statement Pt is progressing towards goals and demo improved ability to don/doff jacket using big movement strategies after review.   Plan review donning/doffing jacket, begin checking STGs   OT Home Exercise Plan Education issued:  PWR! hands HEP, Coordination HEP 08/07/15   Consulted and Agree with Plan of Care Patient        Problem List Patient Active Problem List   Diagnosis Date Noted  . 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 Depression 11/22/2013  . 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 [place], [place], [address] Phone: [phone]   Fax:  [phone]  Name: Elizabeth Mcintosh MRN: [id number] Date of Birth: 1945-10-22  Willa Frater, OTR/L [facility] [address] [place], [place]  [address] [phone] phone [phone] 08/09/2015 4:09 PM

## 2015-08-09 NOTE — Patient Instructions (Signed)

## 2015-08-10 ENCOUNTER — Encounter: Payer: Self-pay | Admitting: Internal Medicine

## 2015-08-10 ENCOUNTER — Ambulatory Visit (INDEPENDENT_AMBULATORY_CARE_PROVIDER_SITE_OTHER): Payer: Medicare Other | Admitting: Internal Medicine

## 2015-08-10 VITALS — BP 80/50 | HR 73 | Ht 68.5 in | Wt 146.8 lb

## 2015-08-10 DIAGNOSIS — G232 Striatonigral degeneration: Secondary | ICD-10-CM | POA: Diagnosis not present

## 2015-08-10 DIAGNOSIS — I951 Orthostatic hypotension: Secondary | ICD-10-CM | POA: Diagnosis not present

## 2015-08-10 NOTE — Patient Instructions (Signed)
Your physician wants you to follow-up in: September 2017 with Dr. Rennis GoldenHilty. You will receive a reminder letter in the mail two months in advance. If you don't receive a letter, please call our office to schedule the follow-up appointment.

## 2015-08-10 NOTE — Therapy (Signed)
[facility] [address] [place], [place], [address] Phone: [phone]   Fax:  [phone]  Physical Therapy Treatment  Patient Details  Name: Elizabeth Mcintosh MRN: [id number] Date of Birth: 1946/04/23 Referring Provider: Wells Guiles Tat  Encounter Date: 08/09/2015      PT End of Session - 08/10/15 1231    Visit Number 6   Number of Visits 17   Date for PT Re-Evaluation 09/11/15   Authorization Type UHC Medicare-Gcode every 10th visit   PT Start Time 1318   PT Stop Time 1400   PT Time Calculation (min) 42 min   Equipment Utilized During Treatment Gait belt   Activity Tolerance Patient tolerated treatment well  No c/o dizziness/blood pressure issues this visit   Behavior During Therapy [facility] for tasks assessed/performed      Past Medical History  Diagnosis Date  . Aneurysm ([place])     s/p clips  . Glaucoma   . Parkinson's disease ([place])   . Sleep apnea   . Hypotension     Past Surgical History  Procedure Laterality Date  . Craniotomy    . Aneurym clipping      There were no vitals filed for this visit.  Visit Diagnosis:  Bradykinesia  Rigidity  Abnormality of gait  Abnormal posture      Subjective Assessment - 08/09/15 1319    Subjective Have not taken my PD medications before my early lunch today.   Patient Stated Goals Pt's goal for therapy is to get a little more steady with walking.   Currently in Pain? No/denies                         [facility] Adult PT Treatment/Exercise - 08/10/15 0001    Transfers   Transfers Sit to Stand;Stand to Sit   Sit to Stand 5: Supervision;Without upper extremity assist;From elevated surface;From chair/3-in-1;4: Min guard   Stand to Sit 5: Supervision;4: Min guard;Without upper extremity assist;To elevated surface;To chair/3-in-1   Number of Reps 10 reps;Other sets (comment)  from 18" and 16" surfaces   Transfer Cueing Performed transfers at table,  pulling chair up to table, pushing away from table, with cues for large amplitude deliberate movement pattern.   Comments Pt performs 8 of 10 reps of sit<>stand from each surface without posterior lean.   Ambulation/Gait   Stairs Yes   Stairs Assistance 5: Supervision  Cues for full foot placement ascending; foot clearance desc.   Stair Management Technique Two rails;Alternating pattern   Number of Stairs 4  5 reps   Height of Stairs 6   Pre-Gait Activities At 6" step and 12" step:  alternating step taps x 10 reps each with cues for full foot clearance; at 6"  step:  step up/up-down/down each foot leading 10 reps, then forward step ups x 10 reps each leg, then forward step taps>hip/knee extended position each leg x 10 reps with UE support   Self-Care   Self-Care Other Self-Care Comments   Other Self-Care Comments  Discussed fall prevention in home environment; discussed fall prevention in regards to patient's Parkinsonism.  Discussed progress towards goals and plans to continue therapy towards LTGs.  Discussed pt's gait at the current time-feel that in therapy, pt is improving gait pattern as well as awareness of gait, and do not recommend assistive device at this time.  However, did discuss importance of utilizing transport chair for long distances, outdoor surfaces. Pt identifies turns  and floor>stand transfers as something to work on in therapy.                PT Education - 08/10/15 1231    Education provided Yes   Education Details Fall prevention, progress towards goals   Person(s) Educated Patient   Methods Explanation;Handout   Comprehension Verbalized understanding          PT Short Term Goals - 08/09/15 1334    PT SHORT TERM GOAL #1   Title Pt will perform HEP with family supervision for improved balance, transfers, and gait.  TARGET 08/11/15   Time 4   Period Weeks   Status Achieved   PT SHORT TERM GOAL #2   Title Pt will perform transfers from 18" surfaces and  below with minimal use of hands, using proper and safe technique, 8 of 10 trials, for improved transfer safety.   Time 4   Period Weeks   Status Partially Met   PT SHORT TERM GOAL #3   Title Pt will improve 4-square step test to less than 14 seconds with no loss of balance.   Baseline 11.56 sec 08/09/15   Time 4   Period Weeks   Status Achieved   PT SHORT TERM GOAL #4   Title Pt will verbalize understanding of fall prevention within the home environment.   Time 4   Period Weeks   Status Achieved           PT Long Term Goals - 07/13/15 1726    PT LONG TERM GOAL #1   Title Pt will improve Mini-Bestest score to at least 22/28 for decreased fall risk.   Time 8   Period Weeks   Status New   PT LONG TERM GOAL #2   Title Pt will demonstrate proper, safe technique for pushing/pulling chairs away from table, modified independently, 4 of 5 trials.   Time 8   Period Weeks   Status New   PT LONG TERM GOAL #3   Title Pt will ambulate at least 500 ft with appropriate assistive device modified independently, without loss of balance.   Time 8   Period Weeks   Status New   PT LONG TERM GOAL #4   Title Pt will perform floor>stand transfer with minimal UE support, modified independently, for safe fall recovery.               Plan - 08/10/15 1232    Clinical Impression Statement Pt has met STG #1, 3, and 4.  STG #2 partially met.  Pt has improved functional mobility, but she continues to benefit from cues for reminders of large amplitude, deliberate paced movements.  She appears to have improved awareness of PD deficits and is responding well to functional activity practice and activities for HEP.  Pt will continue to benefit from further skilled PT to address balance, transfers, gait and functional mobility.   Pt will benefit from skilled therapeutic intervention in order to improve on the following deficits Abnormal gait;Decreased balance;Decreased mobility;Decreased safety  awareness;Decreased coordination;Difficulty walking;Impaired flexibility;Postural dysfunction   Rehab Potential Good   PT Frequency 2x / week   PT Duration 8 weeks  plus eval   PT Treatment/Interventions ADLs/Self Care Home Management;Therapeutic exercise;Therapeutic activities;Functional mobility training;Gait training;DME Instruction;Balance training;Neuromuscular re-education;Patient/family education   PT Next Visit Plan continue to work torwards balance, weigthshifting and step strategies, (may add to HEP);  turning activities; car transfers as able; turnin practice, floor>stand   Consulted and Agree with Plan of  Care Patient        Problem List Patient Active Problem List   Diagnosis Date Noted  . 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 Depression 11/22/2013  . 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 08/10/2015, 12:35 PM Frazier Butt., PT [place] [address] [place], [place], [address] Phone: [phone]   Fax:  [phone]  Name: Elizabeth Mcintosh MRN: [id number] Date of Birth: Jul 10, 1945

## 2015-08-12 DIAGNOSIS — G232 Striatonigral degeneration: Secondary | ICD-10-CM | POA: Insufficient documentation

## 2015-08-12 NOTE — Progress Notes (Signed)
OFFICE NOTE  Chief Complaint:  Follow-up postural hypotension  Primary Care Physician: Gwen PoundsUSSO,[name], MD  HPI:  Elizabeth Mcintosh is a pleasant 70 year old female kindly referred to me by Dr. Arbutus Leasat with neurology for evaluation of postural hypotension and autonomic dysfunction. 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 Unfortunately she has autonomic involvement and his had problems with hypotension and dizziness as well as positional presyncopal symptoms. She's been prescribed a lower extremity compression stockings in an abdominal binder and there is discussion about whether or not we need to start medications in order to help with blood pressure. She's been found to be orthostatic in the past and blood pressure today is actually quite low at 84/59 in the office. She's never had a true syncopal event however that is likely to happen in the near future. She denies any chest pain or worsening shortness of breath. She's never had an echocardiogram.  I saw Elizabeth Mcintosh back in the office today. She underwent an echocardiogram which shows normal LV systolic function and mild diastolic dysfunction. This is reassuring that her orthostatic hypotension is not related to a significant cardiomyopathy. I do believe is related to autonomic dysfunction. We talked about options for management and I feel that she may be a good candidate for the relatively new medicine Northera (droxidopa).   Elizabeth Mcintosh returns today for follow-up of her autonomic postural hypotension. She is accompanied by her daughter who lives in [place]. She did see her neurologist earlier this morning. To this point, she has been placed on Droxidopa and the dose has been titrated up to 600 mg 3 times a day. This is max dose therapy and should provide the most benefit. We have seen a favorable response in blood pressure. At the last office visit we were almost unable to get a blood pressure at rest in a seated  position, but today her blood pressure was 139/78. Unfortunately, she continues to have some syncopal episodes which her daughter feels are more frequent, but the episode should be less likely given the increase in blood pressure. She just recently got compression stockings and is starting to wear them. In addition she saw her primary care provider who recommended adding fludrocortisone to her current regimen. I agree with this therapy and the dose could be uptitrated to 0.2 and even 0.3 mg. She was also advised to work on adequate hydration and liberalize salt in her diet. There was also discussion between her and her primary care provider about advanced directives. I think this is an appropriate discussion although it does not necessarily mean end-of-life care, she should consider the fact that this disease seems to be progressing and that mortality is of fairly high to my understanding.  I the pleasure of seeing Elizabeth Mcintosh back today in the office. She is accompanied by her husband. She recently had a fall for which she was taken to the emergency department and had to have sutures. This was reportedly a "mechanical fall". While walking up stairs. It may have to do with her abnormal gait. The family is noted that she's had significant improvement in her symptoms with the addition of fludrocortisone and Droxidopa. She has not had any more syncopal episodes. She continues to have significant swings in blood pressure with some blood pressures in the 90s systolic and other blood pressures as high as 200. She seems to be asymptomatic with this.  Elizabeth Mcintosh returns today in the office for follow-up. She  continues to have lability in her blood pressures. Blood pressure today was 80/50 in the office however she did not feel orthostatic. 4 choices she's had no further falls. She denies any recurrent syncope. At times she may have some significant hypertension as well. There does not appear to be a clear pattern of  hypertension related to Droxidopa, therefore I continued her on the highest tolerated dose that medication. She is also on fludrocortisone and could potentially have increases in that medicine although as her symptoms have stabilized I would like to keep her on her current dose. She went to [facility] for a second opinion on her diagnosis and saw a neurologist and is awaiting another visit with an additional neurologic specialist (? Movement disorder specialist)  PMHx:  Past Medical History  Diagnosis Date  . Aneurysm (HCC)     s/p clips  . Glaucoma   . Parkinson's disease (HCC)   . Sleep apnea   . Hypotension     Past Surgical History  Procedure Laterality Date  . Craniotomy    . Aneurym clipping      FAMHx:  Family History  Problem Relation Age of Onset  . Dementia Mother   . Heart attack Father   . Kidney failure Father   . Stroke Brother   . Cancer Brother     Prostate  . Diabetes Brother   . Aneurysm Brother     SOCHx:   reports that she has quit smoking. She does not have any smokeless tobacco history on file. She reports that she drinks about 4.2 oz of alcohol per week. She reports that she does not use illicit drugs.  ALLERGIES:  Allergies  Allergen Reactions  . Penicillins     *childhood allergy*    ROS: A comprehensive review of systems was negative except for: Neurological: positive for dizziness and Parkinsonism  HOME MEDS: Current Outpatient Prescriptions  Medication Sig Dispense Refill  . AMBULATORY NON FORMULARY MEDICATION 1 Device by Does not apply route daily. Merry Walker 1 Device 0  . bimatoprost (LUMIGAN) 0.01 % SOLN Place 1 drop into both eyes at bedtime.    . carbidopa-levodopa (SINEMET CR) 50-200 MG tablet TAKE 1 TABLET BY MOUTH AT BEDTIME 30 tablet 5  . carbidopa-levodopa (SINEMET IR) 25-100 MG per tablet TAKE 2 TABLETS IN THE AM, 1 TABLET AT NOON, 1 IN THE EVENING 120 tablet 5  . Droxidopa 300 MG CAPS Take 2 tablets three times a day 168 capsule  10  . fludrocortisone (FLORINEF) 0.1 MG tablet Take 0.1 mg by mouth daily. Take 1 & 1/2 tablets daily.  5  . fluticasone (VERAMYST) 27.5 MCG/SPRAY nasal spray Place 2 sprays into the nose daily.    Marland Kitchen LYSINE PO Take by mouth.    . Melatonin 5 MG CAPS Take by mouth at bedtime.    Marland Kitchen MYRBETRIQ 25 MG TB24 tablet Take 25 mg by mouth daily.     . pramipexole (MIRAPEX) 0.25 MG tablet Take 1 tablet (0.25 mg total) by mouth 3 (three) times daily. 90 tablet 2   No current facility-administered medications for this visit.    LABS/IMAGING: No results found for this or any previous visit (from the past 48 hour(s)). No results found.  WEIGHTS: Wt Readings from Last 3 Encounters:  08/10/15 146 lb 12.8 oz (66.588 kg)  06/21/15 149 lb (67.586 kg)  05/11/15 153 lb 8 oz (69.627 kg)    VITALS: BP 80/50 mmHg  Pulse 73  Ht 5' 8.5" (1.74  m)  Wt 146 lb 12.8 oz (66.588 kg)  BMI 21.99 kg/m2  EXAM: General appearance: alert and no distress Lungs: clear to auscultation bilaterally Heart: regular rate and rhythm, S1, S2 normal, no murmur, click, rub or gallop Extremities: extremities normal, atraumatic, no cyanosis or edema Skin: Skin color, texture, turgor normal. No rashes or lesions Neurologic: Mental status: Alert, oriented, thought content appropriate, Mask facies, does seem somewhat more animated today  EKG: Normal sinus rhythm at 73  ASSESSMENT: 1. Autonomic postural hypotension 2. 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 Depression 4. Syncope  PLAN: 1.   Elizabeth Mcintosh continues to have labile blood pressures but has had no further syncopal episodes. Although she has some high blood pressures at times, she is asymptomatic with it. In general her average blood pressure is fairly normal and I would recommend we continue her current medications. [facility] and will be going back for follow-up in May. She will also see Dr. tablet as well. I'm happy  to see her back in the Fall, likely 6 months from now.  Thanks for allowing me to participate in her care.  Chrystie NoseKenneth C. Tamim Skog, MD, [facility] Attending Cardiologist [facility]  Chrystie NoseKenneth C Andriana Casa 08/12/2015, 12:56 PM

## 2015-08-14 ENCOUNTER — Ambulatory Visit: Payer: Medicare Other | Admitting: Physical Therapy

## 2015-08-14 ENCOUNTER — Ambulatory Visit: Payer: Medicare Other | Admitting: Occupational Therapy

## 2015-08-14 ENCOUNTER — Ambulatory Visit: Payer: Medicare Other

## 2015-08-14 DIAGNOSIS — R258 Other abnormal involuntary movements: Secondary | ICD-10-CM

## 2015-08-14 DIAGNOSIS — R269 Unspecified abnormalities of gait and mobility: Secondary | ICD-10-CM

## 2015-08-14 DIAGNOSIS — R471 Dysarthria and anarthria: Secondary | ICD-10-CM

## 2015-08-14 DIAGNOSIS — R29898 Other symptoms and signs involving the musculoskeletal system: Secondary | ICD-10-CM

## 2015-08-14 DIAGNOSIS — R279 Unspecified lack of coordination: Secondary | ICD-10-CM

## 2015-08-14 DIAGNOSIS — R293 Abnormal posture: Secondary | ICD-10-CM

## 2015-08-14 DIAGNOSIS — R2681 Unsteadiness on feet: Secondary | ICD-10-CM

## 2015-08-14 NOTE — Therapy (Signed)
[facility] [place] [address] [place], [place], [address] Phone: [phone]   Fax:  [phone]  Occupational Therapy Treatment  Patient Details  Name: Elizabeth Mcintosh MRN: [id number] Date of Birth: Oct 08, 1945 No Data Recorded  Encounter Date: 08/14/2015      OT End of Session - 08/14/15 1158    Visit Number 6   Number of Visits 17   Date for OT Re-Evaluation 09/08/15   Authorization Type [employer], no auth, no visit limit, G-code needed   Authorization - Visit Number 6   Authorization - Number of Visits 10   OT Start Time 1150   OT Stop Time 1230   OT Time Calculation (min) 40 min   Activity Tolerance Patient tolerated treatment well   Behavior During Therapy [facility] for tasks assessed/performed      Past Medical History  Diagnosis Date  . Aneurysm ([place])     s/p clips  . Glaucoma   . Parkinson's disease ([place])   . Sleep apnea   . Hypotension     Past Surgical History  Procedure Laterality Date  . Craniotomy    . Aneurym clipping      There were no vitals filed for this visit.  Visit Diagnosis:  Bradykinesia  Rigidity  Lack of coordination  Abnormal posture      Subjective Assessment - 08/14/15 1148    Subjective  "I've been working at home"   Pertinent History Parkinsonism diagnosis 2015, MSA diagnosis 2016, hx of aneurysm clipping, glaucoma, orthostatic hypotension, hx of multiple falls   Patient Stated Goals improve coordination, ROM, ADLs   Currently in Pain? No/denies                      OT Treatments/Exercises (OP) - 08/14/15 0001    ADLs   Writing Practiced writing with focus on/min cues to write big and use PWR! hands prn. Also wrote with category generation for dual task/cognitive component.  Pt with min-mod decr in size with 100% legibility.   ADL Comments Checked STGs and discussed progress--see goals section   Neurological Re-education Exercises   Other Exercises 2 In  standing, functional reaching with weight shift and trunk rotation to grasp release cylinder objects on overhead shelf with min-mod cues for large amplitude movements, particularly with grasp.  Standing at counter with no LOB.  Then step and reach to toss scarves with each UE (laterally) with mod cues for PWR! reach/hands.  Positive reinforcement given for large amplitude.           PWR [facility]) - 08/14/15 1156    PWR! exercises Hands   PWR! Up 10   PWR! Rock 10   PWR! Twist 10   PWR! Step 10   Comments PWR! hands with min cues for larger amplitude               OT Short Term Goals - 08/14/15 1149    OT SHORT TERM GOAL #1   Title Pt will be independent with updated HEP.--ck 08/09/15   Baseline --------   Time 4   Period Weeks   Status New   OT SHORT TERM GOAL #2   Title Pt will write at least 3 sentences with 100% legibility and only min decr in size.    Baseline --------   Time 4   Period Weeks   Status On-going  08/14/15:  not fully met, 95% legibility with min-mod decr in  size  OT SHORT TERM GOAL #3   Title Pt will demo at least 125* R shoulder flex for functional reaching with -20* elbow extension or less.   Baseline 120*   Time 4   Period Weeks   Status Achieved  08/14/15:  140* with -10* elbow extension   OT SHORT TERM GOAL #4   Title Pt will improve ability/ease with donning/doffing jacket as shown by improving time on PPT#4 by at least 8sec.   Baseline 55.41sec   Time 4   Period Weeks   Status Achieved  08/14/15:  34.31sec           OT Long Term Goals - 08/14/15 1257    OT LONG TERM GOAL #1   Title Pt will verbalize understanding of updated AE/strategies to increase ease/independence with ADLs/IADLs prn.--09/09/15   Time 8   Period Weeks   Status New   OT LONG TERM GOAL #2   Title Pt will improve coordination/functional reaching for ADLs as shown by improving score on box and blocks test by at least 5 blocks bilaterally.   Baseline R-38 blocks, L-38  blocks   Time 8   Period Weeks   Status New   OT LONG TERM GOAL #3   Title Pt will demo at least 140* R shoulder flex for functional reaching with elbow ext WNL.   Baseline 120*   Time 8   Period Weeks   Status Revised  08/14/15  met original goal and revised   OT LONG TERM GOAL #4   Title Pt will improve coordination for ADLs as shown by improving time on 9-hole peg test by at least 5 sec with RUE.   Baseline R-42.94sec   Time 8   Period Weeks   Status New   OT LONG TERM GOAL #5   Title Pt will improve ability/ease with donning/doffing jacket as shown by improving time on PPT#4 in 30sec or less.   Baseline 55.41sec   Time 8   Period Weeks   Status Revised  08/14/15 met original goal and revised               Plan - 08/14/15 1159    Clinical Impression Statement Pt is progressing towards goals with improving handwriting and RUE ROM.  Pt continues to need cueing for large amplitude movements.   Pt met LTGs #3 and #5 therefore revised goals.   Plan check remaining STGs, continue to emphasize large amplitude movements for ADLs, **schedule out through plan of care   OT Home Exercise Plan Education issued:  PWR! hands HEP, Coordination HEP 08/07/15   Consulted and Agree with Plan of Care Patient        Problem List Patient Active Problem List   Diagnosis Date Noted  . 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 Depression 11/22/2013  . 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 [place], [place]  [address] [phone] phone [phone] 08/14/2015 5:00 PM

## 2015-08-14 NOTE — Therapy (Signed)
[facility] [address] [place], [place], [address] Phone: [phone]   Fax:  [phone]  Speech Language Pathology Treatment  Patient Details  Name: Elizabeth Mcintosh MRN: [id number] Date of Birth: 17-Dec-1945 Referring Provider: Dr. Lurena Joiner Tat  Encounter Date: 08/14/2015      End of Session - 08/14/15 1251    Visit Number 6   Number of Visits 17   Date for SLP Re-Evaluation 09/06/15   SLP Start Time 1102   SLP Stop Time  1145   SLP Time Calculation (min) 43 min   Activity Tolerance Patient tolerated treatment well      Past Medical History  Diagnosis Date  . Aneurysm (HCC)     s/p clips  . Glaucoma   . Parkinson's disease (HCC)   . Sleep apnea   . Hypotension     Past Surgical History  Procedure Laterality Date  . Craniotomy    . Aneurym clipping      There were no vitals filed for this visit.  Visit Diagnosis: Dysarthria      Subjective Assessment - 08/14/15 1109    Subjective Pt reports talking at loud restaurant Friday night with reduced frequency of misunderstanding/pt repeating.   Currently in Pain? No/denies               ADULT SLP TREATMENT - 08/14/15 1249    General Information   Behavior/Cognition Alert;Cooperative;Pleasant mood   Pain Assessment   Pain Assessment No/denies pain   Cognitive-Linquistic Treatment   Treatment focused on Dysarthria   Skilled Treatment Pt's loud /a/ average 87dB with rare min A for loudness and abdominal push. Pt maintained WNL volume in sentence tasks 80% of the time. SLP used auditory cue for pt ("froggy voice") as need to incr loudness with good "(not excellent) success.    Assessment / Recommendations / Plan   Plan Continue with current plan of care   Progression Toward Goals   Progression toward goals Progressing toward goals            SLP Short Term Goals - 08/14/15 1253    SLP SHORT TERM GOAL #1   Title Pt will demonstrate loud /a/  of 85 dB over 3 sessions with rare min A   Baseline 3 sessions 08/07/15   Time 2   Period Weeks   Status Achieved   SLP SHORT TERM GOAL #2   Title Pt will average 70dB during structured speech tasks with rare min A   Time 2   Period Weeks   Status On-going   SLP SHORT TERM GOAL #3   Title Pt will average 70dB over 5 minute conversation with rare min A   Time 2   Period Weeks   Status On-going          SLP Long Term Goals - 08/14/15 1253    SLP LONG TERM GOAL #1   Title Pt will maintain average of 70dB over 8 munute conversation with occasional min A   Time 6   Period Weeks   Status On-going   SLP LONG TERM GOAL #2   Title Pt will speak audibly in noisy environment during 8 minute conversation with rare min A   Time 6   Period Weeks   Status On-going   SLP LONG TERM GOAL #3   Status On-going          Plan - 08/14/15 1251    Clinical Impression Statement Pt required rare  min A for breath support to maintain adequate volume during structured tasks with sentence responses. Cues consistently needed in simple conversation. Continue skilled ST to maximize intellgibility.    Speech Therapy Frequency 2x / week   Duration --  6 weeks   Treatment/Interventions Compensatory techniques;Internal/external aids;SLP instruction and feedback;Functional tasks;Patient/family education   Potential to Achieve Goals Good   Potential Considerations Severity of impairments        Problem List Patient Active Problem List   Diagnosis Date Noted  . 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 Depression 11/22/2013  . 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

## 2015-08-14 NOTE — Patient Instructions (Addendum)
Single Step: Side    Lifting foot off floor, take one step slowly to left side. Return to starting position. Repeat __10_ times per session. Then repeat with other side.  Do __1-2__ sessions per day. Repeat to opposite side.  Copyright  VHI. All rights reserved.  Weight Shift: Diagonal    Face the counter with your feet staggered.  Slowly shift weight forward over right leg. Return to starting position. Shift backward over opposite leg. Hold each position __2-3__ seconds. Repeat _10_ times per session. Do __1-2__ sessions per day. Repeat with opposite foot position 10 times.   Copyright  VHI. All rights reserved.  Turning in Place: Compliant Surface (Grass / Sand)    Standing at counter, and turn slowly, making quarter turns toward left. Repeat __3__ times per session.  Then repeat with quarter turns to the right. Do _1-2___ sessions per day.   Copyright  VHI. All rights reserved.    Also provided pt with PWR! Moves forward step and weightshift x 10 reps, then PWR! Moves back step and weightshift x 10 reps at counter.

## 2015-08-14 NOTE — Therapy (Signed)
[facility] [address] [place], [place], [address] Phone: [phone]   Fax:  [phone]  Physical Therapy Treatment  Patient Details  Name: Elizabeth Mcintosh MRN: [id number] Date of Birth: [birth year] Referring Provider: Wells Guiles Tat  Encounter Date: 08/14/2015      PT End of Session - 08/14/15 1407    Visit Number 7   Number of Visits 17   Date for PT Re-Evaluation 09/11/15   Authorization Type UHC Medicare-Gcode every 10th visit   PT Start Time 1017   PT Stop Time 1058   PT Time Calculation (min) 41 min   Activity Tolerance Patient tolerated treatment well   Behavior During Therapy [facility] for tasks assessed/performed      Past Medical History  Diagnosis Date  . Aneurysm ([place])     s/p clips  . Glaucoma   . Parkinson's disease ([place])   . Sleep apnea   . Hypotension     Past Surgical History  Procedure Laterality Date  . Craniotomy    . Aneurym clipping      There were no vitals filed for this visit.  Visit Diagnosis:  Bradykinesia  Rigidity  Unsteadiness  Abnormality of gait      Subjective Assessment - 08/14/15 1023    Subjective No changes, no falls this weekend.   Patient Stated Goals Pt's goal for therapy is to get a little more steady with walking.   Currently in Pain? No/denies                         [facility] Adult PT Treatment/Exercise - 08/14/15 0001    Ambulation/Gait   Stairs Yes   Stairs Assistance 5: Supervision  cues for full foot placement ascend/foot clearance descend   Stair Management Technique Two rails;Alternating pattern   Number of Stairs 4  5 reps   Height of Stairs 6   Pre-Gait Activities At 6" and 12" step:  alternating step taps x 10 reps each with UE support, then step up-up/down-down x 10 reps with each leg leading, then forward single leg step ups x 10 reps each leg with UE spport.   Gait Comments Figure-8 turns, then wide U-turns  in gym area with   cues for improved step length and foot clearance.   High Level Balance   High Level Balance Activities --  Quarter turns, clockwise and counterclockwise 2 reps each   High Level Balance Comments At counter, practiced stagger stance forward/back weigthshifting to address improved standing balance and safety, at least 10 reps each exercise with min guard assistance and UE support.  Step and weigthshift side x 10 reps, then step and weigthshift back x 10 reps with UE support.  Forward step and weightshift x 10 reps with UE support x 10.    Lumbar Exercises: Aerobic   Stationary Bike SciFit, Level 1.2, 4 extremities x 8 minutes, with cues for RPM >50 for improved intensity of movement               PT Education - 08/14/15 1407    Education provided Yes   Education Details Additions to HEP-see instructions   Person(s) Educated Patient   Methods Explanation;Demonstration;Handout   Comprehension Verbalized understanding;Returned demonstration;Need further instruction;Verbal cues required          PT Short Term Goals - 08/09/15 1334    PT SHORT TERM GOAL #1   Title Pt will perform HEP with family supervision for improved  balance, transfers, and gait.  TARGET 08/11/15   Time 4   Period Weeks   Status Achieved   PT SHORT TERM GOAL #2   Title Pt will perform transfers from 18" surfaces and below with minimal use of hands, using proper and safe technique, 8 of 10 trials, for improved transfer safety.   Time 4   Period Weeks   Status Partially Met   PT SHORT TERM GOAL #3   Title Pt will improve 4-square step test to less than 14 seconds with no loss of balance.   Baseline 11.56 sec 08/09/15   Time 4   Period Weeks   Status Achieved   PT SHORT TERM GOAL #4   Title Pt will verbalize understanding of fall prevention within the home environment.   Time 4   Period Weeks   Status Achieved           PT Long Term Goals - 07/13/15 1726    PT LONG TERM GOAL #1   Title Pt will improve  Mini-Bestest score to at least 22/28 for decreased fall risk.   Time 8   Period Weeks   Status New   PT LONG TERM GOAL #2   Title Pt will demonstrate proper, safe technique for pushing/pulling chairs away from table, modified independently, 4 of 5 trials.   Time 8   Period Weeks   Status New   PT LONG TERM GOAL #3   Title Pt will ambulate at least 500 ft with appropriate assistive device modified independently, without loss of balance.   Time 8   Period Weeks   Status New   PT LONG TERM GOAL #4   Title Pt will perform floor>stand transfer with minimal UE support, modified independently, for safe fall recovery.               Plan - 08/14/15 1408    Clinical Impression Statement Skilled PT session focused today on transitional movements, with additions to HEP provided.  Pt will continue to benefit from further skilled PT to address balance, transfers, gait, and posture.   Pt will benefit from skilled therapeutic intervention in order to improve on the following deficits Abnormal gait;Decreased balance;Decreased mobility;Decreased safety awareness;Decreased coordination;Difficulty walking;Impaired flexibility;Postural dysfunction   Rehab Potential Good   PT Frequency 2x / week   PT Duration 8 weeks  plus eval   PT Treatment/Interventions ADLs/Self Care Home Management;Therapeutic exercise;Therapeutic activities;Functional mobility training;Gait training;DME Instruction;Balance training;Neuromuscular re-education;Patient/family education   PT Next Visit Plan Review HEP given 08/14/15 visit;  car transfers as able; turning practice, floor>stand; consider given PD exercise chart   Consulted and Agree with Plan of Care Patient        Problem List Patient Active Problem List   Diagnosis Date Noted  . 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 Depression 11/22/2013  . 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 08/14/2015, 2:12 PM  Frazier Butt., PT  [facility] [address] [place] [employer], [place], [address] Phone: [phone]   Fax:  [phone]  Name: CERISE LIEBER MRN: [id number] Date of Birth: [birth year]

## 2015-08-17 ENCOUNTER — Ambulatory Visit: Payer: Medicare Other

## 2015-08-17 ENCOUNTER — Ambulatory Visit: Payer: Medicare Other | Admitting: Occupational Therapy

## 2015-08-17 ENCOUNTER — Ambulatory Visit: Payer: Medicare Other | Admitting: Physical Therapy

## 2015-08-21 ENCOUNTER — Ambulatory Visit: Payer: Medicare Other | Admitting: Physical Therapy

## 2015-08-21 ENCOUNTER — Ambulatory Visit: Payer: Medicare Other

## 2015-08-21 ENCOUNTER — Ambulatory Visit: Payer: Medicare Other | Admitting: Occupational Therapy

## 2015-08-21 DIAGNOSIS — R258 Other abnormal involuntary movements: Secondary | ICD-10-CM | POA: Diagnosis not present

## 2015-08-21 DIAGNOSIS — R293 Abnormal posture: Secondary | ICD-10-CM

## 2015-08-21 DIAGNOSIS — R279 Unspecified lack of coordination: Secondary | ICD-10-CM

## 2015-08-21 DIAGNOSIS — M2560 Stiffness of unspecified joint, not elsewhere classified: Secondary | ICD-10-CM

## 2015-08-21 DIAGNOSIS — R29898 Other symptoms and signs involving the musculoskeletal system: Secondary | ICD-10-CM

## 2015-08-21 DIAGNOSIS — R2689 Other abnormalities of gait and mobility: Secondary | ICD-10-CM

## 2015-08-21 DIAGNOSIS — R269 Unspecified abnormalities of gait and mobility: Secondary | ICD-10-CM

## 2015-08-21 DIAGNOSIS — R471 Dysarthria and anarthria: Secondary | ICD-10-CM

## 2015-08-21 NOTE — Therapy (Signed)
[facility] [facility] [address] [place], [place], [address] Phone: [phone]   Fax:  [phone]  Occupational Therapy Treatment  Patient Details  Name: Elizabeth Mcintosh MRN: [id number] Date of Birth: 08/17/1945 No Data Recorded  Encounter Date: 08/21/2015      OT End of Session - 08/21/15 1741    Visit Number 7   Number of Visits 17   Date for OT Re-Evaluation 09/08/15   Authorization Type UHC Medicare, no auth, no visit limit, G-code needed   Authorization - Visit Number 7   Authorization - Number of Visits 10   OT Start Time 1150   OT Stop Time 1230   OT Time Calculation (min) 40 min   Activity Tolerance Patient tolerated treatment well   Behavior During Therapy [facility] for tasks assessed/performed      Past Medical History  Diagnosis Date  . 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 Glaucoma   . Parkinson's disease ([place])   . Sleep apnea   . Hypotension     Past Surgical History  Procedure Laterality Date  . Craniotomy    . Aneurym clipping      There were no vitals filed for this visit.  Visit Diagnosis:  Bradykinesia  Rigidity  Lack of coordination  Abnormal posture  Decreased functional mobility  Joint stiffness      Subjective Assessment - 08/21/15 1157    Subjective  pt reports performing some ex, but not as much as she should   Pertinent History Parkinsonism diagnosis 2015, MSA diagnosis 2016, hx of aneurysm clipping, glaucoma, orthostatic hypotension, hx of multiple falls   Patient Stated Goals improve coordination, ROM, ADLs   Currently in Pain? No/denies                      OT Treatments/Exercises (OP) - 08/21/15 1746    ADLs   ADL Comments Simulated ADLs with bag emphasizing large amplitude movements:  pulling bag into palm to simulate clothing adjustment with mod cueing (particularly for R hand, but performed with both hands), simulated donning/doffing pants with min cueing,  simulated drying back with min cues.  Cueing provided for larger amplitude with good response.   Neurological Re-education Exercises   Other Exercises 1 In sitting, functional reaching floor>overhead in diagonal pattern incorporating trunk rotation/wt. shift and PWR! reach to grasp/release cylinder objects with min cueing for large amplitude movements  In standing, functional reaching in diagonal pattern incorporating trunk rotation/wt. shift and PWR! reach to manipulate and place clothespins on vertical pole with min cueing.  In sitting, functional reaching with large cards in forward flex and ER with trunk rotation with focus on PWR! Hands/reach and supination with min-mod cueing for large amplitude (particularly with RUE).                     OT Short Term Goals - 08/21/15 1743    OT SHORT TERM GOAL #1   Title Pt will be independent with updated HEP.--ck 08/09/15   Baseline --------   Time 4   Period Weeks   Status On-going  08/21/15 would benefit from reinforcement   OT SHORT TERM GOAL #2   Title Pt will write at least 3 sentences with 100% legibility and only min decr in size.    Baseline --------   Time 4   Period Weeks   Status On-going  08/14/15:  not fully met, 95% legibility with min-mod decr  in  size   OT SHORT TERM GOAL #3   Title Pt will demo at least 125* R shoulder flex for functional reaching with -20* elbow extension or less.   Baseline 120*   Time 4   Period Weeks   Status Achieved  08/14/15:  140* with -10* elbow extension   OT SHORT TERM GOAL #4   Title Pt will improve ability/ease with donning/doffing jacket as shown by improving time on PPT#4 by at least 8sec.   Baseline 55.41sec   Time 4   Period Weeks   Status Achieved  08/14/15:  34.31sec           OT Long Term Goals - 08/14/15 1257    OT LONG TERM GOAL #1   Title Pt will verbalize understanding of updated AE/strategies to increase ease/independence with ADLs/IADLs prn.--09/09/15   Time 8    Period Weeks   Status New   OT LONG TERM GOAL #2   Title Pt will improve coordination/functional reaching for ADLs as shown by improving score on box and blocks test by at least 5 blocks bilaterally.   Baseline R-38 blocks, L-38 blocks   Time 8   Period Weeks   Status New   OT LONG TERM GOAL #3   Title Pt will demo at least 140* R shoulder flex for functional reaching with elbow ext WNL.   Baseline 120*   Time 8   Period Weeks   Status Revised  08/14/15  met original goal and revised   OT LONG TERM GOAL #4   Title Pt will improve coordination for ADLs as shown by improving time on 9-hole peg test by at least 5 sec with RUE.   Baseline R-42.94sec   Time 8   Period Weeks   Status New   OT LONG TERM GOAL #5   Title Pt will improve ability/ease with donning/doffing jacket as shown by improving time on PPT#4 in 30sec or less.   Baseline 55.41sec   Time 8   Period Weeks   Status Revised  08/14/15 met original goal and revised               Plan - 08/21/15 1741    Clinical Impression Statement Pt is progressing towards goals but continues to need reinforcement and cueing for calibration of larger amplitude movements.   Plan continue to emphasize large amplitude movements for ADLs, schedule through POC,    OT Home Exercise Plan Education issued:  PWR! hands HEP, Coordination HEP 08/07/15   Consulted and Agree with Plan of Care Patient        Problem List Patient Active Problem List   Diagnosis Date Noted  . 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 Depression 11/22/2013  . 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 [place], [place]  [address] [phone] phone [phone] 08/21/2015 5:49 PM

## 2015-08-21 NOTE — Patient Instructions (Signed)
   Do 5 loud "AH!" twice a day, at least 6 days a week.  Practice your speech exercises/papers at least 5 days a week, two times a day for 15-20 minutes each time.

## 2015-08-21 NOTE — Therapy (Signed)
[facility] [address] [place], [place], [address] Phone: [phone]   Fax:  [phone]  Speech Language Pathology Treatment  Patient Details  Name: Elizabeth Mcintosh MRN: [id number] Date of Birth: 01/21/1946 Referring Provider: Dr. Lurena Joinerebecca Tat  Encounter Date: 08/21/2015      End of Session - 08/21/15 1636    Visit Number 7   Number of Visits 17   Date for SLP Re-Evaluation 09/06/15   SLP Start Time 1103   SLP Stop Time  1145   SLP Time Calculation (min) 42 min   Activity Tolerance --      Past Medical History  Diagnosis Date  . Aneurysm ([facility])     s/p clips  . Glaucoma   . Parkinson's disease ([facility])   . Sleep apnea   . Hypotension     Past Surgical History  Procedure Laterality Date  . Craniotomy    . Aneurym clipping      There were no vitals filed for this visit.  Visit Diagnosis: Dysarthria      Subjective Assessment - 08/21/15 1115    Subjective "I was in loud restaurants this weekend and nobody asked me to repeat"   Currently in Pain? No/denies               ADULT SLP TREATMENT - 08/21/15 1125    General Information   Behavior/Cognition Alert;Cooperative;Pleasant mood   Treatment Provided   Treatment provided Cognitive-Linquistic   Pain Assessment   Pain Assessment No/denies pain   Cognitive-Linquistic Treatment   Treatment focused on Dysarthria   Skilled Treatment Loud /a/ used to recalibrate pt's speech loudness; average 87dB. Pt req'd rare min-mod A to maintain loudness over her 5 repetitions. SLP used picture description to elicit sentence and multisentence responses and pt req'd mod cues usually faded to min-mod cues occasionally.  Stressed to patient that she will need to practice speech loudness at least twice a day, 15-20 minutes each time.   Assessment / Recommendations / Plan   Plan Continue with current plan of care   Progression Toward Goals   Progression toward goals  Progressing toward goals            SLP Short Term Goals - 08/21/15 1638    SLP SHORT TERM GOAL #1   Title Pt will demonstrate loud /a/ of 85 dB over 3 sessions with rare min A   Baseline 3 sessions 08/07/15   Time 2   Period Weeks   Status Achieved   SLP SHORT TERM GOAL #2   Title Pt will average 70dB during structured speech tasks with rare min A   Time 1   Period Weeks   Status On-going   SLP SHORT TERM GOAL #3   Title Pt will average 70dB over 5 minute conversation with occasional min A   Time 1   Period Weeks   Status Revised          SLP Long Term Goals - 08/21/15 1639    SLP LONG TERM GOAL #1   Title Pt will maintain average of 70dB over 8 munute conversation with occasional min A   Time 5   Period Weeks   Status On-going   SLP LONG TERM GOAL #2   Title Pt will speak audibly in noisy environment during 8 minute conversation with rare min A   Time 5   Period Weeks   Status On-going   SLP LONG TERM GOAL #3  Status On-going          Plan - 08/21/15 1636    Clinical Impression Statement Cues cont consistently needed in simple conversation for loudness. SLP strongly encouraged pt that she must do something routinely or she will not achieve or maintain louder voice. Pt reports she hears her speech indicative of reduced volume and breath support ("froggy", "creaky") but does not stop and use more effort at this time. SLP encouraged pt to always stop and use more effort when she notices this. Continue skilled ST to maximize intellgibility.    Speech Therapy Frequency 2x / week   Duration --  5 weeks   Treatment/Interventions Compensatory techniques;Internal/external aids;SLP instruction and feedback;Functional tasks;Patient/family education   Potential to Achieve Goals Good   Potential Considerations Severity of impairments        Problem List Patient Active Problem List   Diagnosis Date Noted  . Multiple system atrophy, Parkinson variant ([facility])  08/12/2015  . 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 Depression 11/22/2013  . 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 [place], [place], [address] Phone: [phone]   Fax:  [phone]   Name: Elizabeth Mcintosh MRN: [id number] Date of Birth: 1945-07-08

## 2015-08-22 NOTE — Therapy (Signed)
[facility] [address] [place], [place], [address] Phone: [phone]   Fax:  [phone]  Physical Therapy Treatment  Patient Details  Name: Elizabeth Mcintosh MRN: [id number] Date of Birth: 1946/01/20 Referring Provider: Wells Guiles Tat  Encounter Date: 08/21/2015      PT End of Session - 08/22/15 1639    Visit Number 8   Number of Visits 17   Date for PT Re-Evaluation 09/11/15   Authorization Type UHC Medicare-Gcode every 10th visit   PT Start Time 1020   PT Stop Time 1059   PT Time Calculation (min) 39 min   Equipment Utilized During Treatment Gait belt   Activity Tolerance Patient tolerated treatment well   Behavior During Therapy [facility] for tasks assessed/performed      Past Medical History  Diagnosis Date  . Aneurysm ([place])     s/p clips  . Glaucoma   . Parkinson's disease ([place])   . Sleep apnea   . Hypotension     Past Surgical History  Procedure Laterality Date  . Craniotomy    . Aneurym clipping      There were no vitals filed for this visit.  Visit Diagnosis:  Bradykinesia  Rigidity  Abnormal posture  Abnormality of gait      Subjective Assessment - 08/21/15 1019    Subjective NO changes, no falls since last visit.   Patient Stated Goals Pt's goal for therapy is to get a little more steady with walking.   Currently in Pain? No/denies                         [facility] Adult PT Treatment/Exercise - 08/21/15 1029    Transfers   Transfers Floor to Transfer  Floor to stand transfer   Sit to Stand --   Floor to Transfer 5: Supervision;4: Min guard;With upper extremity assist  with cues   Floor to Transfer Details Verbal cues for technique;Verbal cues for sequencing;Tactile cues for weight shifting  min assist to get from side sitting to quadruped   Number of Reps Other reps (comment)  3   Comments In tall kneeling, pt performs tall kneel<>sit back on heels, then tall kneel>half  kneel x 10 reps each leg, with UE supported at mat table.   Ambulation/Gait   Ambulation/Gait Yes   Ambulation/Gait Assistance 5: Supervision   Ambulation/Gait Assistance Details Cues for upright posture, arm swing and foot clearance.  Gait with environmental scanning and conversation tasks around gym area-no LOB.   Ambulation Distance (Feet) 400 Feet  then 230   Assistive device None   Gait Pattern Step-through pattern;Decreased arm swing - right;Decreased arm swing - left;Decreased step length - right;Decreased dorsiflexion - right;Decreased trunk rotation;Narrow base of support;Poor foot clearance - right   Ambulation Surface Level;Indoor   High Level Balance   High Level Balance Activities --  Quarter turns to R and L, 2 reps each with UE support   High Level Balance Comments Review of HEP provided last visit-pt performs with UE support with min verbal cues for technique and large amplitude/deliberate movement patterns                  PT Short Term Goals - 08/09/15 1334    PT SHORT TERM GOAL #1   Title Pt will perform HEP with family supervision for improved balance, transfers, and gait.  TARGET 08/11/15   Time 4   Period Weeks   Status Achieved  PT SHORT TERM GOAL #2   Title Pt will perform transfers from 18" surfaces and below with minimal use of hands, using proper and safe technique, 8 of 10 trials, for improved transfer safety.   Time 4   Period Weeks   Status Partially Met   PT SHORT TERM GOAL #3   Title Pt will improve 4-square step test to less than 14 seconds with no loss of balance.   Baseline 11.56 sec 08/09/15   Time 4   Period Weeks   Status Achieved   PT SHORT TERM GOAL #4   Title Pt will verbalize understanding of fall prevention within the home environment.   Time 4   Period Weeks   Status Achieved           PT Long Term Goals - 07/13/15 1726    PT LONG TERM GOAL #1   Title Pt will improve Mini-Bestest score to at least 22/28 for decreased  fall risk.   Time 8   Period Weeks   Status New   PT LONG TERM GOAL #2   Title Pt will demonstrate proper, safe technique for pushing/pulling chairs away from table, modified independently, 4 of 5 trials.   Time 8   Period Weeks   Status New   PT LONG TERM GOAL #3   Title Pt will ambulate at least 500 ft with appropriate assistive device modified independently, without loss of balance.   Time 8   Period Weeks   Status New   PT LONG TERM GOAL #4   Title Pt will perform floor>stand transfer with minimal UE support, modified independently, for safe fall recovery.               Plan - 08/22/15 1639    Clinical Impression Statement Skilled PT session today focused on HEP review as well as floor>stand transfer training.  Pt has difficulty with transition from supine/sidelying on floor >quadruped, despite cues for increased momentum, pt needs min assist.  Pt will continue to benefit from further skilled PT to address balance, transfers, gait and posture.   Pt will benefit from skilled therapeutic intervention in order to improve on the following deficits Abnormal gait;Decreased balance;Decreased mobility;Decreased safety awareness;Decreased coordination;Difficulty walking;Impaired flexibility;Postural dysfunction   Rehab Potential Good   PT Frequency 2x / week   PT Duration 8 weeks  plus eval   PT Treatment/Interventions ADLs/Self Care Home Management;Therapeutic exercise;Therapeutic activities;Functional mobility training;Gait training;DME Instruction;Balance training;Neuromuscular re-education;Patient/family education   PT Next Visit Plan car transfers as able; turning practice, floor>stand; consider given PD exercise chart   Consulted and Agree with Plan of Care Patient        Problem List Patient Active Problem List   Diagnosis Date Noted  . 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 Depression 11/22/2013  .  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 08/22/2015, 4:44 PM  Frazier Butt., PT [facility] [address] [place], [place], [address] Phone: [phone]   Fax:  [phone]  Name: Elizabeth Mcintosh MRN: [id number] Date of Birth: Feb 05, 1946

## 2015-08-24 ENCOUNTER — Ambulatory Visit: Payer: Medicare Other | Admitting: Occupational Therapy

## 2015-08-24 ENCOUNTER — Ambulatory Visit: Payer: Medicare Other

## 2015-08-24 ENCOUNTER — Ambulatory Visit: Payer: Medicare Other | Admitting: Physical Therapy

## 2015-08-24 DIAGNOSIS — R293 Abnormal posture: Secondary | ICD-10-CM

## 2015-08-24 DIAGNOSIS — R269 Unspecified abnormalities of gait and mobility: Secondary | ICD-10-CM

## 2015-08-24 DIAGNOSIS — R2689 Other abnormalities of gait and mobility: Secondary | ICD-10-CM

## 2015-08-24 DIAGNOSIS — R29898 Other symptoms and signs involving the musculoskeletal system: Secondary | ICD-10-CM

## 2015-08-24 DIAGNOSIS — R279 Unspecified lack of coordination: Secondary | ICD-10-CM

## 2015-08-24 DIAGNOSIS — R258 Other abnormal involuntary movements: Secondary | ICD-10-CM | POA: Diagnosis not present

## 2015-08-24 DIAGNOSIS — R471 Dysarthria and anarthria: Secondary | ICD-10-CM

## 2015-08-24 NOTE — Therapy (Signed)
[facility] [address] [place], [place], [address] Phone: [phone]   Fax:  [phone]  Occupational Therapy Treatment  Patient Details  Name: Elizabeth Mcintosh MRN: [id number] Date of Birth: 1945-12-16 No Data Recorded  Encounter Date: 08/24/2015      OT End of Session - 08/24/15 1206    Visit Number 8   Number of Visits 17   Date for OT Re-Evaluation 09/08/15   Authorization Type UHC Medicare, no auth, no visit limit, G-code needed   Authorization - Visit Number 8   Authorization - Number of Visits 10   OT Start Time 1150   OT Stop Time 1230   OT Time Calculation (min) 40 min   Activity Tolerance Patient tolerated treatment well   Behavior During Therapy [facility] for tasks assessed/performed      Past Medical History  Diagnosis Date  . Aneurysm ([place])     s/p clips  . Glaucoma   . Parkinson's disease ([place])   . Sleep apnea   . Hypotension     Past Surgical History  Procedure Laterality Date  . Craniotomy    . Aneurym clipping      There were no vitals filed for this visit.  Visit Diagnosis:  Bradykinesia  Rigidity  Abnormal posture  Lack of coordination  Decreased functional mobility      Subjective Assessment - 08/24/15 1154    Pertinent History Parkinsonism diagnosis 2015, MSA diagnosis 2016, hx of aneurysm clipping, glaucoma, orthostatic hypotension, hx of multiple falls   Patient Stated Goals improve coordination, ROM, ADLs   Currently in Pain? No/denies         Treatment: Simulated ADLs with bag emphasizing large amplitude movements:  pulling bag into palm to simulate clothing adjustment with min-mod cueing, simulated donning/doffing pants with min cueing, simulated drying back with min cue, tucking in shirt, reaching in front.   Cueing provided for larger amplitude with good response. Simulated car transfer PWR! Stepping in seated,  with PWR! Up sit to stand x 10, cueing to lean forward  further. Pt reports difficulty at times if someone parks too close to curb for foot positioning.  Pt had 1 loss of balance when feet were too far out in front of her. (Pt reports sometimes this is the case depending where her friends park) Continue to problem solve with pt.                        OT Short Term Goals - 08/21/15 1743    OT SHORT TERM GOAL #1   Title Pt will be independent with updated HEP.--ck 08/09/15   Baseline --------   Time 4   Period Weeks   Status On-going  08/21/15 would benefit from reinforcement   OT SHORT TERM GOAL #2   Title Pt will write at least 3 sentences with 100% legibility and only min decr in size.    Baseline --------   Time 4   Period Weeks   Status On-going  08/14/15:  not fully met, 95% legibility with min-mod decr in  size   OT SHORT TERM GOAL #3   Title Pt will demo at least 125* R shoulder flex for functional reaching with -20* elbow extension or less.   Baseline 120*   Time 4   Period Weeks   Status Achieved  08/14/15:  140* with -10* elbow extension   OT SHORT TERM GOAL #4   Title Pt will  improve ability/ease with donning/doffing jacket as shown by improving time on PPT#4 by at least 8sec.   Baseline 55.41sec   Time 4   Period Weeks   Status Achieved  08/14/15:  34.31sec           OT Long Term Goals - 08/14/15 1257    OT LONG TERM GOAL #1   Title Pt will verbalize understanding of updated AE/strategies to increase ease/independence with ADLs/IADLs prn.--09/09/15   Time 8   Period Weeks   Status New   OT LONG TERM GOAL #2   Title Pt will improve coordination/functional reaching for ADLs as shown by improving score on box and blocks test by at least 5 blocks bilaterally.   Baseline R-38 blocks, L-38 blocks   Time 8   Period Weeks   Status New   OT LONG TERM GOAL #3   Title Pt will demo at least 140* R shoulder flex for functional reaching with elbow ext WNL.   Baseline 120*   Time 8   Period Weeks   Status  Revised  08/14/15  met original goal and revised   OT LONG TERM GOAL #4   Title Pt will improve coordination for ADLs as shown by improving time on 9-hole peg test by at least 5 sec with RUE.   Baseline R-42.94sec   Time 8   Period Weeks   Status New   OT LONG TERM GOAL #5   Title Pt will improve ability/ease with donning/doffing jacket as shown by improving time on PPT#4 in 30sec or less.   Baseline 55.41sec   Time 8   Period Weeks   Status Revised  08/14/15 met original goal and revised               Plan - 08/24/15 1542    Clinical Impression Statement Pt is progressing towards goals but she needs reinforcement for carryover of larger amplitude movments for ADLS/IADLs.   Pt will benefit from skilled therapeutic intervention in order to improve on the following deficits (Retired) Decreased coordination;Decreased activity tolerance;Impaired tone;Impaired UE functional use;Decreased mobility;Decreased balance;Decreased range of motion   Rehab Potential Good   OT Frequency 2x / week   OT Duration 8 weeks   OT Treatment/Interventions Self-care/ADL training;Neuromuscular education;Therapist, nutritional;Therapeutic exercise   Plan reinforce large amplitude movments for ADLs, practice with AE for donning TEDS   OT Home Exercise Plan Education issued:  PWR! hands HEP, Coordination HEP 08/07/15   Consulted and Agree with Plan of Care Patient        Problem List Patient Active Problem List   Diagnosis Date Noted  . 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 Depression 11/22/2013  . 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

## 2015-08-24 NOTE — Therapy (Signed)
[facility] [address] [place], [place], [address] Phone: [phone]   Fax:  [phone]  Speech Language Pathology Treatment  Patient Details  Name: Elizabeth Mcintosh MRN: [id number] Date of Birth: [birth year] Referring Provider: Dr. Wells Guiles Tat  Encounter Date: 08/24/2015      End of Session - 08/24/15 1100    Visit Number 8   Number of Visits 17   Date for SLP Re-Evaluation 09/06/15   SLP Start Time 1026   SLP Stop Time  1100  pt late   SLP Time Calculation (min) 34 min   Activity Tolerance Patient tolerated treatment well      Past Medical History  Diagnosis Date  . 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 Glaucoma   . Parkinson's disease ([place])   . Sleep apnea   . Hypotension     Past Surgical History  Procedure Laterality Date  . Craniotomy    . Aneurym clipping      There were no vitals filed for this visit.  Visit Diagnosis: Dysarthria      Subjective Assessment - 08/24/15 1039    Subjective "When I'm in the next room my husband doesn't ask me to repeat now."               ADULT SLP TREATMENT - 08/24/15 1043    General Information   Behavior/Cognition Alert;Cooperative;Pleasant mood   Treatment Provided   Treatment provided Cognitive-Linquistic   Pain Assessment   Pain Assessment No/denies pain   Cognitive-Linquistic Treatment   Treatment focused on Dysarthria   Skilled Treatment Loud /a/ used to recalibrate pt's speech loudness; average 88dB. Pt req'd rare min A to maintain loudness. In simple conversation of 8 minutes pt maintained loudness of 69dB average.In semi-structured picture description tasks pt maintained 69-70dB average 75% of the time with SLP rare min A.    Assessment / Recommendations / Plan   Plan Continue with current plan of care   Progression Toward Goals   Progression toward goals Progressing toward goals          SLP Education - 08/24/15 1100    Education  provided Yes   Education Details consistency of practice very important in all therapies   Person(s) Educated Patient   Methods Explanation   Comprehension Verbalized understanding          SLP Short Term Goals - 08/24/15 1102    SLP SHORT TERM GOAL #1   Title Pt will demonstrate loud /a/ of 85 dB over 3 sessions with rare min A   Baseline 3 sessions 08/07/15   Time 2   Period Weeks   Status Achieved   SLP SHORT TERM GOAL #2   Title Pt will average 70dB during structured speech tasks with rare min A   Time 1   Period Weeks   Status Not Met   SLP SHORT TERM GOAL #3   Title Pt will average 70dB over 5 minute conversation with occasional min A   Time 1   Period Weeks   Status Partially Met          SLP Long Term Goals - 08/24/15 1103    SLP LONG TERM GOAL #1   Title Pt will maintain average of 70dB over 8 munute conversation with occasional min A   Time 5   Period Weeks   Status On-going   SLP LONG TERM GOAL #2   Title Pt will speak audibly in  noisy environment during 8 minute conversation with rare min A   Time 5   Period Weeks   Status On-going   SLP LONG TERM GOAL #3   Status On-going          Plan - 08/24/15 1100    Clinical Impression Statement In conversation today of 8 minutes pt maintained loudness of 69dB average with cues from SLP (rare min). SLP again strongly encouraged pt that she cont to do something routinely in order to maintain louder voice. Continue skilled ST to maximize intellgibility.    Speech Therapy Frequency 2x / week   Duration --  5 weeks   Treatment/Interventions Compensatory techniques;Internal/external aids;SLP instruction and feedback;Functional tasks;Patient/family education   Potential to Achieve Goals Good   Potential Considerations Severity of impairments        Problem List Patient Active Problem List   Diagnosis Date Noted  . 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 Autonomic postural hypotension  01/05/2015  . Depression 11/22/2013  . 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

## 2015-08-24 NOTE — Therapy (Signed)
[facility] [address] [place], [place], [address] Phone: [phone]   Fax:  [phone]  Physical Therapy Treatment  Patient Details  Name: Elizabeth Mcintosh MRN: [id number] Date of Birth: Jun 25, 1945 Referring Provider: Wells Guiles Tat  Encounter Date: 08/24/2015      PT End of Session - 08/24/15 1150    Visit Number 9   Number of Visits 17   Date for PT Re-Evaluation 09/11/15   Authorization Type UHC Medicare-Gcode every 10th visit   PT Start Time 1101   PT Stop Time 1144   PT Time Calculation (min) 43 min   Equipment Utilized During Treatment Gait belt   Activity Tolerance Patient tolerated treatment well   Behavior During Therapy [facility] for tasks assessed/performed      Past Medical History  Diagnosis Date  . Aneurysm ([place])     s/p clips  . Glaucoma   . Parkinson's disease ([place])   . Sleep apnea   . Hypotension     Past Surgical History  Procedure Laterality Date  . Craniotomy    . Aneurym clipping      There were no vitals filed for this visit.  Visit Diagnosis:  Abnormality of gait  Bradykinesia  Rigidity  Abnormal posture      Subjective Assessment - 08/24/15 1102    Subjective No changes, no falls since last visit   Patient Stated Goals Pt's goal for therapy is to get a little more steady with walking.   Currently in Pain? No/denies                         [facility]/Exercise - 08/24/15 0001    Ambulation/Gait   Ambulation/Gait Yes   Ambulation/Gait Assistance 5: Supervision   Ambulation/Gait Assistance Details Cues for upright posture, improved armswing with gait   Ambulation Distance (Feet) 500 Feet  550 ft with walking poles for arm swing facilitation   Assistive device None   Gait Pattern Step-through pattern;Decreased arm swing - right;Decreased arm swing - left;Decreased step length - right;Decreased dorsiflexion - right;Decreased trunk rotation;Narrow  base of support;Poor foot clearance - right   Ambulation Surface Level;Indoor   Gait Comments Gait with quick start/stops, forward/backward change of directions, turns through narrow spaces with supervision, no LOB noted.   Gait with cognitive task x 500 ft, decr. foot clearance   High Level Balance   High Level Balance Activities Backward walking;Turns;Figure 8 turns;Weight-shifting turns;Marching turns  Forward/back, 12 ft 5 reps/lengths of counter   High Level Balance Comments With forward/back walking, focused on smooth transition between forward/back directions; performed back step and weightshift 2 sets x 10 (consecutive LEs > alternating LEs), then forward/back step and weigthshift x 10 reps  with UE support at counter; stagger stance forward/back weightshift 10 reps each foot position, all to emphasize deliberate weigthshift into posterior direction.     Turning practice for small spaces:  Quarter (clock) turns to R and L, 2 reps each; attempted marching turns and weightshifting turns, but pt has difficulty sequencing these.  Discussed best strategy for tight space turning is likely quarter turns.  Also practiced big step and go-to initiate change of direction from static standing position-pt able to perform this 8 reps at various locations in gym without LOB.             PT Short Term Goals - 08/09/15 1334    PT SHORT TERM GOAL #1   Title  Pt will perform HEP with family supervision for improved balance, transfers, and gait.  TARGET 08/11/15   Time 4   Period Weeks   Status Achieved   PT SHORT TERM GOAL #2   Title Pt will perform transfers from 18" surfaces and below with minimal use of hands, using proper and safe technique, 8 of 10 trials, for improved transfer safety.   Time 4   Period Weeks   Status Partially Met   PT SHORT TERM GOAL #3   Title Pt will improve 4-square step test to less than 14 seconds with no loss of balance.   Baseline 11.56 sec 08/09/15   Time 4    Period Weeks   Status Achieved   PT SHORT TERM GOAL #4   Title Pt will verbalize understanding of fall prevention within the home environment.   Time 4   Period Weeks   Status Achieved           PT Long Term Goals - 07/13/15 1726    PT LONG TERM GOAL #1   Title Pt will improve Mini-Bestest score to at least 22/28 for decreased fall risk.   Time 8   Period Weeks   Status New   PT LONG TERM GOAL #2   Title Pt will demonstrate proper, safe technique for pushing/pulling chairs away from table, modified independently, 4 of 5 trials.   Time 8   Period Weeks   Status New   PT LONG TERM GOAL #3   Title Pt will ambulate at least 500 ft with appropriate assistive device modified independently, without loss of balance.   Time 8   Period Weeks   Status New   PT LONG TERM GOAL #4   Title Pt will perform floor>stand transfer with minimal UE support, modified independently, for safe fall recovery.               Plan - 08/24/15 1151    Clinical Impression Statement Gait and balance activities performed today, with focus on posterior direction stepping, weightshifting and transition stepping towards forward direction.  Worked on turning strategies and pt seems to prefer quarter turns for turns in tight spaces.  Pt will continue to benefit from further skilled PT to address balance, transfers, posture and gait.   Pt will benefit from skilled therapeutic intervention in order to improve on the following deficits Abnormal gait;Decreased balance;Decreased mobility;Decreased safety awareness;Decreased coordination;Difficulty walking;Impaired flexibility;Postural dysfunction   Rehab Potential Good   PT Frequency 2x / week   PT Duration 8 weeks  plus eval   PT Treatment/Interventions ADLs/Self Care Home Management;Therapeutic exercise;Therapeutic activities;Functional mobility training;Gait training;DME Instruction;Balance training;Neuromuscular re-education;Patient/family education   PT  Next Visit Plan Turning practice, floor>stand, compliant surfaces; GCODE NEXT VISIT   Consulted and Agree with Plan of Care Patient     Pt did not have any appointments scheduled beyond today-would like to continue to see pt through POC, but may need to be on waiting list due to scheduling conflicts.   Problem List Patient Active Problem List   Diagnosis Date Noted  . 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 Depression 11/22/2013  . 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 08/24/2015, 11:55 AM Frazier Butt., PT [facility] [address] [place], [place], [address] Phone: [phone]   Fax:  [phone]  Name: Elizabeth Mcintosh MRN: [id number] Date of Birth: 1946/03/23

## 2015-08-28 ENCOUNTER — Encounter: Payer: Medicare Other | Admitting: Occupational Therapy

## 2015-08-30 ENCOUNTER — Ambulatory Visit: Payer: Medicare Other | Admitting: Occupational Therapy

## 2015-08-30 ENCOUNTER — Ambulatory Visit: Payer: Medicare Other

## 2015-08-30 DIAGNOSIS — R29898 Other symptoms and signs involving the musculoskeletal system: Secondary | ICD-10-CM

## 2015-08-30 DIAGNOSIS — R279 Unspecified lack of coordination: Secondary | ICD-10-CM

## 2015-08-30 DIAGNOSIS — R471 Dysarthria and anarthria: Secondary | ICD-10-CM

## 2015-08-30 DIAGNOSIS — R258 Other abnormal involuntary movements: Secondary | ICD-10-CM | POA: Diagnosis not present

## 2015-08-30 DIAGNOSIS — R293 Abnormal posture: Secondary | ICD-10-CM

## 2015-08-30 NOTE — Therapy (Signed)
[facility] [address] [place], [place], [address] Phone: [phone]   Fax:  [phone]  Speech Language Pathology Treatment  Patient Details  Name: Elizabeth Mcintosh MRN: [id number] Date of Birth: 1945/10/23 Referring Provider: Dr. Wells Guiles Tat  Encounter Date: 08/30/2015      End of Session - 08/30/15 1015    Visit Number 9   Number of Visits 17   Date for SLP Re-Evaluation 09/06/15   SLP Start Time 0935   SLP Stop Time  1016   SLP Time Calculation (min) 41 min   Activity Tolerance Patient tolerated treatment well      Past Medical History  Diagnosis Date  . Aneurysm ([place])     s/p clips  . Glaucoma   . Parkinson's disease ([place])   . Sleep apnea   . Hypotension     Past Surgical History  Procedure Laterality Date  . Craniotomy    . Aneurym clipping      There were no vitals filed for this visit.  Visit Diagnosis: Dysarthria      Subjective Assessment - 08/30/15 0941    Subjective "I didn't get much sleep last night and then overslept this morning."   Currently in Pain? No/denies               ADULT SLP TREATMENT - 08/30/15 0944    General Information   Behavior/Cognition Alert;Cooperative;Pleasant mood   Treatment Provided   Treatment provided Cognitive-Linquistic   Cognitive-Linquistic Treatment   Treatment focused on Dysarthria   Skilled Treatment SLP facilitated pt's louder speech today by having her perform loud /a/ with average 90dB. In picture description (semi structured sentence task) pt maintained 70dB 60% of the time. With simple question and answer task, SLP facilitated speech at or above 70dB by using mod A usually.    Assessment / Recommendations / Plan   Plan Continue with current plan of care   Progression Toward Goals   Progression toward goals Progressing toward goals            SLP Short Term Goals - 08/30/15 1018    SLP SHORT TERM GOAL #1   Title Pt will  demonstrate loud /a/ of 85 dB over 3 sessions with rare min A   Baseline --   Time --   Period --   Status Achieved   SLP SHORT TERM GOAL #2   Title Pt will average 70dB during structured speech tasks with rare min A   Time --   Period --   Status Not Met   SLP SHORT TERM GOAL #3   Title Pt will average 70dB over 5 minute conversation with occasional min A   Time --   Period --   Status Partially Met          SLP Long Term Goals - 08/30/15 1024    SLP LONG TERM GOAL #1   Title Pt will maintain average of 70dB over 8 munute conversation with occasional min A   Time 4   Period Weeks   Status On-going   SLP LONG TERM GOAL #2   Title Pt will speak audibly in noisy environment during 8 minute conversation with rare min A   Time 4   Period Weeks   Status On-going                 Plan - 08/30/15 1015    Clinical Impression Statement Pt had more difficulty with loudness during  this session, likely due to pt reported non-restful sleep last night. Continue skilled ST to maximize intellgibility.    Speech Therapy Frequency 2x / week   Duration 4 weeks  5 weeks   Treatment/Interventions Compensatory techniques;Internal/external aids;SLP instruction and feedback;Functional tasks;Patient/family education   Potential to Achieve Goals Good   Potential Considerations Severity of impairments        Problem List Patient Active Problem List   Diagnosis Date Noted  . 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 Depression 11/22/2013  . 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 [place], [place], [address] Phone: [phone]   Fax:  [phone]   Name: Elizabeth Mcintosh MRN: [id number] Date of Birth: [birth year]

## 2015-08-31 NOTE — Therapy (Signed)
[facility] [address] [place], [place], [address] Phone: [phone]   Fax:  [phone]  Occupational Therapy Treatment  Patient Details  Name: Elizabeth Mcintosh MRN: [id number] Date of Birth: [birth year] No Data Recorded  Encounter Date: 08/30/2015      OT End of Session - 08/30/15 1038    Visit Number 9   Number of Visits 17   Date for OT Re-Evaluation 09/08/15   Authorization Type UHC Medicare, no auth, no visit limit, G-code needed   Authorization - Visit Number 9   Authorization - Number of Visits 10   OT Start Time 1022   OT Stop Time 1101   OT Time Calculation (min) 39 min      Past Medical History  Diagnosis Date  . Aneurysm ([place])     s/p clips  . Glaucoma   . Parkinson's disease ([place])   . Sleep apnea   . Hypotension     Past Surgical History  Procedure Laterality Date  . Craniotomy    . Aneurym clipping      There were no vitals filed for this visit.  Visit Diagnosis:  Bradykinesia  Rigidity  Abnormal posture  Lack of coordination      dynamic step and reach to both sides for simulated ADLs, min /mod v.c for PWR! hands/ larger amplitude movements  fine motor coordination activities:flipping/ dealing playing cards with emphasis on finger extension and PWR! Hands before task, mod v.c. Handwriting  with mod v.c.for  larger letter size, pt continues to demonstrate micrographia .                         OT Short Term Goals - 08/21/15 1743    OT SHORT TERM GOAL #1   Title Pt will be independent with updated HEP.--ck 08/09/15   Baseline --------   Time 4   Period Weeks   Status On-going  08/21/15 would benefit from reinforcement   OT SHORT TERM GOAL #2   Title Pt will write at least 3 sentences with 100% legibility and only min decr in size.    Baseline --------   Time 4   Period Weeks   Status On-going  08/14/15:  not fully met, 95% legibility with min-mod decr in   size   OT SHORT TERM GOAL #3   Title Pt will demo at least 125* R shoulder flex for functional reaching with -20* elbow extension or less.   Baseline 120*   Time 4   Period Weeks   Status Achieved  08/14/15:  140* with -10* elbow extension   OT SHORT TERM GOAL #4   Title Pt will improve ability/ease with donning/doffing jacket as shown by improving time on PPT#4 by at least 8sec.   Baseline 55.41sec   Time 4   Period Weeks   Status Achieved  08/14/15:  34.31sec           OT Long Term Goals - 08/30/15 1052    OT LONG TERM GOAL #1   Title Pt will verbalize understanding of updated AE/strategies to increase ease/independence with ADLs/IADLs prn.--09/09/15   Time 8               Plan - 08/31/15 1641    Clinical Impression Statement Pt demonstrates progress towards goals but she needs futher reinforcement for carryover of larger amplitude movements with functional tasks.   Rehab Potential Good   OT Frequency 2x / week  OT Duration 8 weeks   Plan check goals next visit, and renew(pt has a gap in scheduling), AE for TEDs   OT Home Exercise Plan Education issued:  PWR! hands HEP, Coordination HEP 08/07/15   Consulted and Agree with Plan of Care Patient        Problem List Patient Active Problem List   Diagnosis Date Noted  . 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 Depression 11/22/2013  . 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 [place], [place], [address] Phone: [phone]   Fax:  [phone]  Name: Elizabeth Mcintosh MRN: [id number] Date of Birth: [birth year]

## 2015-09-11 ENCOUNTER — Ambulatory Visit: Payer: Medicare Other | Admitting: Physical Therapy

## 2015-09-11 ENCOUNTER — Ambulatory Visit: Payer: Medicare Other | Attending: Neurology | Admitting: Speech Pathology

## 2015-09-11 DIAGNOSIS — R2689 Other abnormalities of gait and mobility: Secondary | ICD-10-CM | POA: Insufficient documentation

## 2015-09-11 DIAGNOSIS — R29898 Other symptoms and signs involving the musculoskeletal system: Secondary | ICD-10-CM | POA: Diagnosis present

## 2015-09-11 DIAGNOSIS — R29818 Other symptoms and signs involving the nervous system: Secondary | ICD-10-CM | POA: Diagnosis present

## 2015-09-11 DIAGNOSIS — R278 Other lack of coordination: Secondary | ICD-10-CM | POA: Diagnosis present

## 2015-09-11 DIAGNOSIS — M25611 Stiffness of right shoulder, not elsewhere classified: Secondary | ICD-10-CM | POA: Insufficient documentation

## 2015-09-11 DIAGNOSIS — R293 Abnormal posture: Secondary | ICD-10-CM | POA: Insufficient documentation

## 2015-09-11 DIAGNOSIS — R269 Unspecified abnormalities of gait and mobility: Secondary | ICD-10-CM

## 2015-09-11 DIAGNOSIS — R471 Dysarthria and anarthria: Secondary | ICD-10-CM

## 2015-09-11 NOTE — Therapy (Deleted)
[facility] [address] [place], [place], [address] Phone: [phone]   Fax:  [phone]  Speech Language Pathology Treatment  Patient Details  Name: Elizabeth Mcintosh MRN: [id number] Date of Birth: 04/16/1946 Referring Provider: Dr. Wells Guiles Tat  Encounter Date: 09/11/2015      End of Session - 10/02/15 1450    Visit Number 13   Number of Visits [place] requested   SLP Start Time 8882   SLP Stop Time  8003   SLP Time Calculation (min) 41 min      Past Medical History  Diagnosis Date  . Aneurysm ([place])     s/p clips  . Glaucoma   . Parkinson's disease ([place])   . Sleep apnea   . Hypotension     Past Surgical History  Procedure Laterality Date  . Craniotomy    . Aneurym clipping      There were no vitals filed for this visit.  Visit Diagnosis: Dysarthria and anarthria      Subjective Assessment - 10/02/15 1411    Subjective "I went to a dinner party with 30 women last night and had a sinking spell"               ADULT SLP TREATMENT - 10/02/15 1411    General Information   Behavior/Cognition Alert;Cooperative;Pleasant mood   Treatment Provided   Treatment provided Cognitive-Linquistic   Pain Assessment   Pain Assessment No/denies pain   Cognitive-Linquistic Treatment   Treatment focused on Dysarthria   Skilled Treatment Loud /a/ to recalibrated volume with average of 86dB with supervision cues. Structured speech tasks with average of  71dB. Simple conversation average 69dB with occasional min A.   Assessment / Recommendations / Plan   Plan Continue with current plan of care   Progression Toward Goals   Progression toward goals Progressing toward goals            SLP Short Term Goals - 10/02/15 1449    SLP SHORT TERM GOAL #1   Title Pt will demonstrate loud /a/ of 85 dB over 3 sessions with rare min A   Status Achieved   SLP SHORT TERM GOAL #2   Title Pt  will average 70dB during structured speech tasks with rare min A   Status Not Met   SLP SHORT TERM GOAL #3   Title Pt will average 70dB over 5 minute conversation with occasional min A   Status Partially Met          SLP Long Term Goals - 10/02/15 1450    SLP LONG TERM GOAL #1   Title Pt will maintain average of 69dB over 8 munute simple-mod complex conversation with occasional min A   Time 2  (all LTGs beginning week of 09-17-15)   Period Weeks   Status Revised   SLP LONG TERM GOAL #2   Title Pt will demo 100% intelligibility in 8 minute conversation in a mod-max noisy environment with rare min A   Time 2   Period Weeks   Status Revised   SLP LONG TERM GOAL #3   Title pt will maintain loud /a/ at average 85dB over 4 sessions   Time 2   Period Weeks   Status Revised          Plan - 10/02/15 1444    Clinical Impression Statement Pt with improved loudness  during structured speech tasks and min A to maintain  loudness during simple conversation. Continue skilled ST to maximize intelligiblity and carryover of compensations for dysarthria.    Speech Therapy Frequency 2x / week   Duration 4 weeks   Treatment/Interventions Compensatory techniques;Internal/external aids;SLP instruction and feedback;Functional tasks;Patient/family education   Potential to Achieve Goals Good   Potential Considerations Severity of impairments   Consulted and Agree with Plan of Care Patient        Problem List Patient Active Problem List   Diagnosis Date Noted  . 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 Depression 11/22/2013  . 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 [place], [place], [address] Phone: [phone]   Fax:  [phone]   Name: ALVILDA MCKENNA MRN: [id number] Date  of Birth: 1946-04-14

## 2015-09-12 ENCOUNTER — Ambulatory Visit: Payer: Medicare Other

## 2015-09-12 ENCOUNTER — Ambulatory Visit: Payer: Medicare Other | Admitting: Physical Therapy

## 2015-09-12 DIAGNOSIS — R471 Dysarthria and anarthria: Secondary | ICD-10-CM

## 2015-09-12 DIAGNOSIS — R2689 Other abnormalities of gait and mobility: Secondary | ICD-10-CM

## 2015-09-12 NOTE — Therapy (Addendum)
[facility] [address] [place], [place], [address] Phone: [phone]   Fax:  [phone]  Physical Therapy Treatment  Patient Details  Name: Elizabeth Mcintosh MRN: [id number] Date of Birth: 01/21/1946 Referring Provider: Wells Guiles Tat  Encounter Date: 09/11/2015      PT End of Session - 09/12/15 0854    Visit Number 10   Number of Visits 17   Date for PT Re-Evaluation 09/11/15   Authorization Type [employer] Medicare-Gcode every 10th visit   PT Start Time 1152   PT Stop Time 1232   PT Time Calculation (min) 40 min   Activity Tolerance Patient tolerated treatment well      Past Medical History  Diagnosis Date  . 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 Glaucoma   . Parkinson's disease ([place])   . Sleep apnea   . Hypotension     Past Surgical History  Procedure Laterality Date  . Craniotomy    . Aneurym clipping      There were no vitals filed for this visit.  Visit Diagnosis:  Abnormality of gait      Subjective Assessment - 09/11/15 1158    Subjective Denies fall or changes since last visit.  Reports one near fall with decreased BP this weekend.   Patient Stated Goals Pt's goal for therapy is to get a little more steady with walking.   Currently in Pain? No/denies            [facility] PT Assessment - 09/12/15 0001    Four Square Step Test    Trial One  11.26        Mini-BESTest: Balance Evaluation Systems Test  2005-2013 [employer]. All rights reserved. ________________________________________________________________________________________Anticipatory_________Subscore___5__/6 1. SIT TO STAND Instruction: "Cross your arms across your chest. Try not to use your hands unless you must.Do not let your legs lean against the back of the chair when you stand. Please stand up now." (2) Normal: Comes to stand without use of hands and stabilizes independently. X-(1) Moderate: Comes to stand WITH  use of hands on first attempt. (0) Severe: Unable to stand up from chair without assistance, OR needs several attempts with use of hands. 2. RISE TO TOES Instruction: "Place your feet shoulder width apart. Place your hands on your hips. Try to rise as high as you can onto your toes. I will count out loud to 3 seconds. Try to hold this pose for at least 3 seconds. Look straight ahead. Rise now." X-(2) Normal: Stable for 3 s with maximum height. (1) Moderate: Heels up, but not full range (smaller than when holding hands), OR noticeable instability for 3 s. (0) Severe: < 3 s. 3. STAND ON ONE LEG Instruction: "Look straight ahead. Keep your hands on your hips. Lift your leg off of the ground behind you without touching or resting your raised leg upon your other standing leg. Stay standing on one leg as long as you can. Look straight ahead. Lift now." Left: Time in Seconds Trial 1:_2.16____Trial 2:__7.06___ (2) Normal: 20 s. X-(1) Moderate: < 20 s. (0) Severe: Unable. Right: Time in Seconds Trial 1:__2.12___Trial 2:_13.19____ (2) Normal: 20 s. X-(1) Moderate: < 20 s. (0) Severe: Unable To score each side separately use the trial with the longest time. To calculate the sub-score and total score use the side [left or right] with the lowest numerical score [i.e. the worse side]. ______________________________________________________________________________________Reactive Postural Control___________Subscore:_6___/6 4. COMPENSATORY STEPPING CORRECTION- FORWARD Instruction: "  Stand with your feet shoulder width apart, arms at your sides. Lean forward against my hands beyond your forward limits. When I let go, do whatever is necessary, including taking a step, to avoid a fall." X-(2) Normal: Recovers independently with a single, large step (second realignment step is allowed). (1) Moderate: More than one step used to recover equilibrium. (0) Severe: No step, OR would fall if not caught, OR falls  spontaneously. 5. COMPENSATORY STEPPING CORRECTION- BACKWARD Instruction: "Stand with your feet shoulder width apart, arms at your sides. Lean backward against my hands beyond your backward limits. When I let go, do whatever is necessary, including taking a step, to avoid a fall." X-(2) Normal: Recovers independently with a single, large step. (1) Moderate: More than one step used to recover equilibrium. (0) Severe: No step, OR would fall if not caught, OR falls spontaneously. 6. COMPENSATORY STEPPING CORRECTION- LATERAL Instruction: "Stand with your feet together, arms down at your sides. Lean into my hand beyond your sideways limit. When I let go, do whatever is necessary, including taking a step, to avoid a fall." Left X-(2) Normal: Recovers independently with 1 step (crossover or lateral OK). (1) Moderate: Several steps to recover equilibrium. (0) Severe: Falls, or cannot step. Right X-(2) Normal: Recovers independently with 1 step (crossover or lateral OK). (1) Moderate: Several steps to recover equilibrium. (0) Severe: Falls, or cannot step. Use the side with the lowest score to calculate sub-score and total score. ____________________________________________________________________________________Sensory Orientation_____________Subscore:__6_______/6 7. STANCE (FEET TOGETHER); EYES OPEN, FIRM SURFACE Instruction: "Place your hands on your hips. Place your feet together until almost touching. Look straight ahead. Be as stable and still as possible, until I say stop." Time in seconds:________ X-(2) Normal: 30 s. (1) Moderate: < 30 s. (0) Severe: Unable. 8. STANCE (FEET TOGETHER); EYES CLOSED, FOAM SURFACE Instruction: "Step onto the foam. Place your hands on your hips. Place your feet together until almost touching. Be as stable and still as possible, until I say stop. I will start timing when you close your eyes." Time in seconds:________ X-(2) Normal: 30 s. (1) Moderate: < 30  s. (0) Severe: Unable. 9. INCLINE- EYES CLOSED Instruction: "Step onto the incline ramp. Please stand on the incline ramp with your toes toward the top. Place your feet shoulder width apart and have your arms down at your sides. I will start timing when you close your eyes." Time in seconds:________ X-(2) Normal: Stands independently 30 s and aligns with gravity. (1) Moderate: Stands independently <30 s OR aligns with surface. (0) Severe: Unable. _________________________________________________________________________________________Dynamic Gait ______Subscore___7_____/10 10. CHANGE IN GAIT SPEED Instruction: "Begin walking at your normal speed, when I tell you 'fast', walk as fast as you can. When I say 'slow', walk very slowly." X-(2) Normal: Significantly changes walking speed without imbalance. (1) Moderate: Unable to change walking speed or signs of imbalance. (0) Severe: Unable to achieve significant change in walking speed AND signs of imbalance. [place] - HORIZONTAL Instruction: "Begin walking at your normal speed, when I say "right", turn your head and look to the right. When I say "left" turn your head and look to the left. Try to keep yourself walking in a straight line." X-(2) Normal: performs head turns with no change in gait speed and good balance. (1) Moderate: performs head turns with reduction in gait speed. (0) Severe: performs head turns with imbalance. 12. WALK WITH PIVOT TURNS Instruction: "Begin walking at your normal speed. When I tell you to 'turn and  stop', turn as quickly as you can, face the opposite direction, and stop. After the turn, your feet should be close together." (2) Normal: Turns with feet close FAST (< 3 steps) with good balance. X-(1) Moderate: Turns with feet close SLOW (>4 steps) with good balance. (0) Severe: Cannot turn with feet close at any speed without imbalance. 13. STEP OVER OBSTACLES Instruction: "Begin walking at your  normal speed. When you get to the box, step over it, not around it and keep walking." (2) Normal: Able to step over box with minimal change of gait speed and with good balance. X-(1) Moderate: Steps over box but touches box OR displays cautious behavior by slowing gait. (0) Severe: Unable to step over box OR steps around box. 14. TIMED UP & GO WITH DUAL TASK [3 METER WALK] Instruction TUG: "When I say 'Go', stand up from chair, walk at your normal speed across the tape on the floor, turn around, and come back to sit in the chair." Instruction TUG with Dual Task: "Count backwards by threes starting at ___. When I say 'Go', stand up from chair, walk at your normal speed across the tape on the floor, turn around, and come back to sit in the chair. Continue counting backwards the entire time." TUG: _8.16_______seconds; Dual Task TUG: __9.28______seconds (2) Normal: No noticeable change in sitting, standing or walking while backward counting when compared to TUG without Dual Task. X-(1) Moderate: Dual Task affects either counting OR walking (>10%) when compared to the TUG without Dual Task. (0) Severe: Stops counting while walking OR stops walking while counting. When scoring item 14, if subject's gait speed slows more than 10% between the TUG without and with a Dual Task the score should be decreased by a point. TOTAL SCORE: _____24___/28              [facility] Adult PT Treatment/Exercise - 09/12/15 0001    Transfers   Five time sit to stand comments  16.35  multiple attempts due to pushing posteriorly with sit>stand   Ambulation/Gait   Gait velocity 10.75 sec=3.05   Timed Up and Go Test   TUG Normal TUG;Manual TUG;Cognitive TUG   Normal TUG (seconds) 8.16   Manual TUG (seconds) 8.96   Cognitive TUG (seconds) 9.28                PT Education - 09/12/15 0836    Education provided Yes   Education Details progress toward goals   Person(s) Educated Patient   Methods  Explanation   Comprehension Verbalized understanding          PT Short Term Goals - 08/09/15 1334    PT SHORT TERM GOAL #1   Title Pt will perform HEP with family supervision for improved balance, transfers, and gait.  TARGET 08/11/15   Time 4   Period Weeks   Status Achieved   PT SHORT TERM GOAL #2   Title Pt will perform transfers from 18" surfaces and below with minimal use of hands, using proper and safe technique, 8 of 10 trials, for improved transfer safety.   Time 4   Period Weeks   Status Partially Met   PT SHORT TERM GOAL #3   Title Pt will improve 4-square step test to less than 14 seconds with no loss of balance.   Baseline 11.56 sec 08/09/15   Time 4   Period Weeks   Status Achieved   PT SHORT TERM GOAL #4   Title Pt will verbalize understanding  of fall prevention within the home environment.   Time 4   Period Weeks   Status Achieved       PT Long Term Goals - 07/13/15 1726    PT LONG TERM GOAL #1   Title Pt will improve Mini-Bestest score to at least 22/28 for decreased fall risk.   Time 8   Period Weeks   Status Goal met   PT LONG TERM GOAL #2   Title Pt will demonstrate proper, safe technique for pushing/pulling chairs away from table, modified independently, 4 of 5 trials.   Time 8   Period Weeks   Status Ongoing   PT LONG TERM GOAL #3   Title Pt will ambulate at least 500 ft with appropriate assistive device modified independently, without loss of balance.   Time 8   Period Weeks   Status Ongoing   PT LONG TERM GOAL #4   Title Pt will perform floor>stand transfer with minimal UE support, modified independently, for safe fall recovery. 8 Weeks ongoing                        Plan - 09/26/2015 0857    Clinical Impression Statement Pt met LTG # 1.  Goals 2,3 and 4 ongoing.  Pt has had inconsistent therapy due to wanting to schedule all 3 disciplines on the same day.  Renewal per Mady Haagensen,  PT for remainder of POC.   Pt will benefit from skilled therapeutic intervention in order to improve on the following deficits Abnormal gait;Decreased balance;Decreased mobility;Decreased safety awareness;Decreased coordination;Difficulty walking;Impaired flexibility;Postural dysfunction   Rehab Potential Good   PT Frequency 2x / week   PT Duration 8 weeks  plus eval   PT Treatment/Interventions ADLs/Self Care Home Management;Therapeutic exercise;Therapeutic activities;Functional mobility training;Gait training;DME Instruction;Balance training;Neuromuscular re-education;Patient/family education   PT Next Visit Plan Renewal per Mady Haagensen, PT.  Turning practice, floor>stand, compliant surfaces.   Consulted and Agree with Plan of Care Patient          G-Codes - 09/26/2015 0853    Functional Assessment Tool Used Four square step test 11.26 seconds;MiniBest 24/28   Functional Limitation Mobility: Walking and moving around   Mobility: Walking and Moving Around Current Status [phone]) At least 20 percent but less than 40 percent impaired, limited or restricted   Mobility: Walking and Moving Around Goal Status [phone]) At least 20 percent but less than 40 percent impaired, limited or restricted      Problem List Patient Active Problem List   Diagnosis Date Noted  . 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 Depression 11/22/2013  . 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 [place], [place], [address] Phone: [phone]   Fax:  [phone]  Name: Elizabeth Mcintosh MRN: [id number] Date of Birth: 1945-08-16    Narda Bonds, [place] 09-26-15 12:52 PM Phone: [phone] Fax: [phone]  Addendum: G Codes Functional Assessment Tool:  MiniBestest 24/28, Four square step test 24/28 Functional Limitation:  Mobility: Walking and Moving Around Current Status  [phone]): CJ Goal Status ([id number]): CJ  Physical Therapy Progress Note  Dates of Reporting Period: 07/12/15 to 09/11/15  Objective Reports of Subjective Statement: Reports no recent falls, reports family noting improved functional mobility  Objective Measurements: Four square step test:  11.26 sec, Mini Best test 24/28.  Pt has posterior pull/retropulsion on sit<>stand transfers.  Goal Update: Pt has met LTG #1, LTG #2-4 remain ongoing.  Pt has not been seen in past several weeks due to scheduling conflicts; therefore goals have not been fully addressed.  Plan: Renewal to be completed to cover additional 4 weeks of POC to continue to address dynamic balance and gait.  Reason Skilled Services are Required: Pt has improved functional mobility, but remains at high fall risk due to progressive nature of patient's disease.  She would benefit from continued reinforcement of safe and proper techniques for functional mobility.  Mady Haagensen, PT 09/12/2015 4:21 PM Phone: [phone] Fax: [phone]

## 2015-09-12 NOTE — Therapy (Signed)
[facility] [address] [place], [place], [address] Phone: [phone]   Fax:  [phone]  Speech Language Pathology Treatment  Patient Details  Name: Elizabeth Mcintosh MRN: [id number] Date of Birth: 05-Apr-1946 Referring Provider: Dr. Wells Guiles Tat  Encounter Date: 09/12/2015      End of Session - 09/12/15 1627    Visit Number 11   Number of Visits 17   Date for SLP Re-Evaluation 09/06/15   SLP Start Time 1105   SLP Stop Time  1146   SLP Time Calculation (min) 41 min      Past Medical History  Diagnosis Date  . Aneurysm ([place])     s/p clips  . Glaucoma   . Parkinson's disease ([place])   . Sleep apnea   . Hypotension     Past Surgical History  Procedure Laterality Date  . Craniotomy    . Aneurym clipping      There were no vitals filed for this visit.  Visit Diagnosis: Dysarthria      Subjective Assessment - 09/11/15 1109    Subjective "I do good on my ah's"   Currently in Pain? No/denies               ADULT SLP TREATMENT - 09/12/15 1110    General Information   Behavior/Cognition Alert;Cooperative;Pleasant mood   Treatment Provided   Treatment provided Cognitive-Linquistic   Cognitive-Linquistic Treatment   Treatment focused on Dysarthria   Skilled Treatment SLP used loud /a/ as means to re-calibrate speech volume with average of 88dB. Structured speech tasks with average of 70dB with occasional min cues for loudness. Simple conversation average 69dB with visual and verbal cues usually to maintian loudness.   Assessment / Recommendations / Plan   Plan Continue with current plan of care   Progression Toward Goals   Progression toward goals Progressing toward goals          SLP Education - 09/11/15 1140    Education provided Yes   Education Details home practice 15-20 minutes with loud talking   Person(s) Educated Patient   Methods Explanation   Comprehension Verbalized understanding           SLP Short Term Goals - 09/11/15 1142    SLP SHORT TERM GOAL #1   Title Pt will demonstrate loud /a/ of 85 dB over 3 sessions with rare min A   Status Achieved   SLP SHORT TERM GOAL #2   Title Pt will average 70dB during structured speech tasks with rare min A   Status Not Met   SLP SHORT TERM GOAL #3   Title Pt will average 70dB over 5 minute conversation with occasional min A   Status Partially Met          SLP Long Term Goals - 09/12/15 1628    SLP LONG TERM GOAL #1   Title Pt will maintain average of 69dB over 8 munute simple-mod complex conversation with occasional min A   Time 4  (all LTGs beginning week of 09-17-15)   Period Weeks   Status Revised   SLP LONG TERM GOAL #2   Title Pt will demo 100% intelligibility in 8 minute conversation in a mod-max noisy environment with rare min A   Time 4   Period Weeks   Status Revised   SLP LONG TERM GOAL #3   Title pt will maintain loud /a/ at average 85dB over 4 sessions   Time 4   Period  Weeks   Status Revised          Plan - 09/12/15 1627    Clinical Impression Statement Overall, pt's awareness of reduced volume in therapy tasks and in conversation has improved, however cues are still needed in structured tasks and conversation for pt to maintain loudness over time. See STG and LTG sections for updated goals. Pt's LTGs had to be modified slightly (downgraded) and will be enforce for 4 additional weeks (beginning week of 09-17-15). Pt would benefit from cont'd skilled ST to maximize intellgibility.    Speech Therapy Frequency 2x / week   Duration 4 weeks   Treatment/Interventions Compensatory techniques;Internal/external aids;SLP instruction and feedback;Functional tasks;Patient/family education   Potential to Achieve Goals Good   Potential Considerations Severity of impairments   Consulted and Agree with Plan of Care Patient          G-Codes - 09-17-15 1144    Functional Assessment Tool Used noms, skilled  clinical observation   Motor Speech Current Status [phone]) At least 20 percent but less than 40 percent impaired, limited or restricted   Motor Speech Goal Status ([id number]) At least 20 percent but less than 40 percent impaired, limited or restricted      Problem List Patient Active Problem List   Diagnosis Date Noted  . 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 Depression 11/22/2013  . 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 [place], [place], [address] Phone: [phone]   Fax:  [phone]   Name: Elizabeth Mcintosh MRN: [id number] Date of Birth: Oct 29, 1945

## 2015-09-12 NOTE — Therapy (Addendum)
[facility] [address] [place], [place], [address] Phone: [phone]   Fax:  [phone]  Physical Therapy Treatment  Patient Details  Name: Elizabeth Mcintosh MRN: [id number] Date of Birth: 1946-04-12 Referring Provider: Wells Guiles Tat  Encounter Date: 09/12/2015      PT End of Session - 09/12/15 1310    Visit Number 11   Number of Visits 17   Date for PT Re-Evaluation 09/11/15   Authorization Type UHC Medicare-Gcode every 10th visit   PT Start Time 1152   PT Stop Time 1230   PT Time Calculation (min) 38 min   Activity Tolerance Patient tolerated treatment well      Past Medical History  Diagnosis Date  . Aneurysm ([place])     s/p clips  . Glaucoma   . Parkinson's disease ([place])   . Sleep apnea   . Hypotension     Past Surgical History  Procedure Laterality Date  . Craniotomy    . Aneurym clipping      There were no vitals filed for this visit.  Visit Diagnosis:  Other abnormalities of gait and mobility      Subjective Assessment - 09/12/15 1301    Subjective Denies falls or changes   Patient Stated Goals Pt's goal for therapy is to get a little more steady with walking.   Currently in Pain? No/denies                       [facility] Adult PT Treatment/Exercise - 09/12/15 1305    Transfers   Transfers Floor to Transfer  stand   Floor to Transfer 5: Supervision;4: Min guard;With upper extremity assist   Floor to Transfer Details (indicate cue type and reason) verbal cues for techinique.  Repeated x 4 for practice.  1 UE support from mat table to come from kneeling  to standing.  Pt did require min assist on first attempt due to LOB posteriorly but stated she was "dizzy".  Able to repeat 3 more times without physical assist.   High Level Balance   High Level Balance Activities Other (comment)   High Level Balance Comments Seated on blue therapy ball for trunk control working on bouncing,  forward<>backward weight shift, marching and LAQ-needed min UE assist to balance.             Balance Exercises - 09/12/15 1302    Balance Exercises: Standing   Rockerboard Anterior/posterior;Lateral;Head turns;EO;Other reps (comment);Intermittent UE support  20 reps   Balance Beam foam balance beam with head turns/nods x 20 reps with intermittent UE support   Other Standing Exercises forward, backward then forward<>backward weight shifting at counter bil sides x 20 each with intermittent UE support             PT Short Term Goals - 08/09/15 1334    PT SHORT TERM GOAL #1   Title Pt will perform HEP with family supervision for improved balance, transfers, and gait.  TARGET 08/11/15   Time 4   Period Weeks   Status Achieved   PT SHORT TERM GOAL #2   Title Pt will perform transfers from 18" surfaces and below with minimal use of hands, using proper and safe technique, 8 of 10 trials, for improved transfer safety.   Time 4   Period Weeks   Status Partially Met   PT SHORT TERM GOAL #3   Title Pt will improve 4-square step test to less than 14 seconds  with no loss of balance.   Baseline 11.56 sec 08/09/15   Time 4   Period Weeks   Status Achieved   PT SHORT TERM GOAL #4   Title Pt will verbalize understanding of fall prevention within the home environment.   Time 4   Period Weeks   Status Achieved           PT Long Term Goals - 09/12/15 1252    PT LONG TERM GOAL #1   Title Pt will improve Mini-Bestest score to at least 22/28 for decreased fall risk.   Baseline 24/28 on 09/11/15   Time 8   Period Weeks   Status Achieved   PT LONG TERM GOAL #2   Title Pt will demonstrate proper, safe technique for pushing/pulling chairs away from table, modified independently, 4 of 5 trials.   Time 8   Period Weeks   Status On-going   PT LONG TERM GOAL #3   Title Pt will ambulate at least 500 ft with appropriate assistive device modified independently, without loss of balance.    Time 8   Period Weeks   Status On-going   PT LONG TERM GOAL #4   Title Pt will perform floor>stand transfer with minimal UE support, modified independently, for safe fall recovery.   Status On-going               Plan - 09/12/15 1311    Clinical Impression Statement Pt continues with LOB posteriorly at times and decreased balance reactions.  Continue PT per POC.  Note modifications to POC, as pt has missed several visits/weeks due to scheduling conflicts.  Pt will continue to beneft from skilled PT to address balance, posture, transfers, gait.  See recert this visit.   Pt will benefit from skilled therapeutic intervention in order to improve on the following deficits Abnormal gait;Decreased balance;Decreased mobility;Decreased safety awareness;Decreased coordination;Difficulty walking;Impaired flexibility;Postural dysfunction   Rehab Potential Good   PT Frequency 2x / week   PT Duration 4 weeks, per recert 08/12/98   PT Treatment/Interventions ADLs/Self Care Home Management;Therapeutic exercise;Therapeutic activities;Functional mobility training;Gait training;DME Instruction;Balance training;Neuromuscular re-education;Patient/family education   PT Next Visit Plan compliant surfaces, stepping strategies, turns.   Consulted and Agree with Plan of Care Patient      Problem List Patient Active Problem List   Diagnosis Date Noted  . 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 Depression 11/22/2013  . 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 [place], [place], [address] Phone: [phone]   Fax:  [phone]  Name: Elizabeth Mcintosh MRN: [id number] Date of Birth: Aug 11, 1945    Narda Bonds, PTA [place] 09/12/2015 1:13 PM Phone:  [phone] Fax: [phone]  MOdifications made to LTGs and to plan in order to recert and continue PT.  See below:      PT Long Term Goals - 09/17/15 2206    PT LONG TERM GOAL #1   Title Pt will improve Mini-Bestest score to at least 22/28 for decreased fall risk.   Baseline 24/28 on 09/11/15   Time 8   Period Weeks   Status Achieved   PT LONG TERM GOAL #2   Title Pt will demonstrate proper, safe technique for pushing/pulling chairs away from table, modified independently, 4 of 5 trials.  EXTEND/GOAL ONGOING:  TARGET 10/12/15   Time 8  Period Weeks   Status On-going   PT LONG TERM GOAL #3   Title Pt will ambulate at least 500 ft with appropriate assistive device modified independently, without loss of balance.  GOAL ONGOING:  TARGET 10/12/15   Time 8   Period Weeks   Status On-going   PT LONG TERM GOAL #4   Title Pt will perform floor>stand transfer with minimal UE support, modified independently, for safe fall recovery.  GOAL ONGOING 10/12/15   Status On-going       Mady Haagensen, PT 09/17/2015 10:15 PM Phone: [phone] Fax: [phone]

## 2015-09-17 NOTE — Addendum Note (Signed)
Addended by: Gean MaidensMARRIOTT, AMY W on: 09/17/2015 10:22 PM   Modules accepted: Orders

## 2015-09-25 ENCOUNTER — Ambulatory Visit: Payer: Medicare Other | Admitting: Occupational Therapy

## 2015-09-25 ENCOUNTER — Ambulatory Visit: Payer: Medicare Other | Admitting: Speech Pathology

## 2015-09-25 ENCOUNTER — Ambulatory Visit: Payer: Medicare Other | Admitting: Physical Therapy

## 2015-09-25 DIAGNOSIS — R471 Dysarthria and anarthria: Secondary | ICD-10-CM

## 2015-09-25 DIAGNOSIS — R293 Abnormal posture: Secondary | ICD-10-CM

## 2015-09-25 DIAGNOSIS — M25611 Stiffness of right shoulder, not elsewhere classified: Secondary | ICD-10-CM

## 2015-09-25 DIAGNOSIS — R29818 Other symptoms and signs involving the nervous system: Secondary | ICD-10-CM

## 2015-09-25 DIAGNOSIS — R2689 Other abnormalities of gait and mobility: Secondary | ICD-10-CM

## 2015-09-25 DIAGNOSIS — R29898 Other symptoms and signs involving the musculoskeletal system: Secondary | ICD-10-CM

## 2015-09-25 DIAGNOSIS — R278 Other lack of coordination: Secondary | ICD-10-CM

## 2015-09-25 NOTE — Therapy (Signed)
[facility] [address] [place], [place], [address] Phone: [phone]   Fax:  [phone]  Speech Language Pathology Treatment  Patient Details  Name: Elizabeth Mcintosh MRN: [id number] Date of Birth: [birth year] Referring Provider: Dr. Wells Guiles Mcintosh  Encounter Date: 09/25/2015      End of Session - 09/25/15 1150    Visit Number 12   Number of Visits 17   Date for SLP Re-Evaluation 09/06/15   Authorization Type Elizabeth Mcintosh requested   SLP Start Time 1103   SLP Stop Time  1147   SLP Time Calculation (min) 44 min      Past Medical History  Diagnosis Date  . Aneurysm (Elizabeth Mcintosh)     s/p clips  . Glaucoma   . Parkinson's disease ([name])   . Sleep apnea   . Hypotension     Past Surgical History  Procedure Laterality Date  . Craniotomy    . Aneurym clipping      There were no vitals filed for this visit.      Subjective Assessment - 09/25/15 1109    Subjective "I only got 1 or 2 "whats" with my family over Easter"   Currently in Pain? Other (Comment)  lower back pain has resolved since PT               ADULT SLP TREATMENT - 09/25/15 1112    General Information   Behavior/Cognition Alert;Cooperative;Pleasant mood   Treatment Provided   Treatment provided Cognitive-Linquistic   Pain Assessment   Pain Assessment No/denies pain   Cognitive-Linquistic Treatment   Treatment focused on Dysarthria   Skilled Treatment Recalibrated volume with loud /a/ average  87dB with verbal cues for abdominal push.  Structured  speech tasks with average of  70dB  with occasional min cues.  Simple conversation ranged from  65dB to 72dB with occasional min cues for breath support and loud volume.   Assessment / Recommendations / Plan   Plan Continue with current plan of care   Progression Toward Goals   Progression toward goals Progressing toward goals            SLP Short Term Goals - 09/25/15 1150    SLP SHORT TERM GOAL  #1   Title Pt will demonstrate loud /a/ of 85 dB over 3 sessions with rare min A   Status Achieved   SLP SHORT TERM GOAL #2   Title Pt will average 70dB during structured speech tasks with rare min A   Status Not Met   SLP SHORT TERM GOAL #3   Title Pt will average 70dB over 5 minute conversation with occasional min A   Status Partially Met          SLP Long Term Goals - 09/25/15 1150    SLP LONG TERM GOAL #1   Title Pt will maintain average of 69dB over 8 munute simple-mod complex conversation with occasional min A   Time 3  (all LTGs beginning week of 09-17-15)   Period Weeks   Status Revised   SLP LONG TERM GOAL #2   Title Pt will demo 100% intelligibility in 8 minute conversation in a mod-max noisy environment with rare min A   Time 3   Period Weeks   Status Revised   SLP LONG TERM GOAL #3   Title pt will maintain loud /a/ at average 85dB over 4 sessions   Time 3   Period Weeks   Status Revised  Plan - 09/25/15 1148    Clinical Impression Statement Pt with improved consistency of loudness during structured tasks. She continues to require verbal cues to mainain loudness duirng conversatoin. Continue skilled ST to maximize intellgibilty and carryover of adequate volume outside of therapy.    Speech Therapy Frequency 2x / week   Duration 4 weeks   Treatment/Interventions Compensatory techniques;Internal/external aids;SLP instruction and feedback;Functional tasks;Patient/family education   Potential to Achieve Goals Good   Potential Considerations Severity of impairments   Consulted and Agree with Plan of Care Patient      Patient will benefit from skilled therapeutic intervention in order to improve the following deficits and impairments:   Dysarthria    Problem List Patient Active Problem List   Diagnosis Date Noted  . 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 Depression 11/22/2013  .  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

## 2015-09-25 NOTE — Therapy (Signed)
[facility] [address] [place], [place], [address] Phone: [phone]   Fax:  [phone]  Physical Therapy Treatment  Patient Details  Name: Elizabeth Mcintosh MRN: [id number] Date of Birth: 04-27-1946 Referring Provider: Wells Guiles Tat  Encounter Date: 09/25/2015      PT End of Session - 09/25/15 1955    Visit Number 12   Number of Visits 17   Date for PT Re-Evaluation 09/11/15   Authorization Type UHC Medicare-Gcode every 10th visit   PT Start Time 1018   PT Stop Time 1100   PT Time Calculation (min) 42 min   Activity Tolerance Patient tolerated treatment well      Past Medical History  Diagnosis Date  . Aneurysm ([place])     s/p clips  . Glaucoma   . Parkinson's disease ([place])   . Sleep apnea   . Hypotension     Past Surgical History  Procedure Laterality Date  . Craniotomy    . Aneurym clipping      There were no vitals filed for this visit.      Subjective Assessment - 09/25/15 1024    Subjective Having more back pain from sleeping on wedge at night due to BP can increase with medication so Dr Tat recommended wedge to sleep elevated.  Denies falls or other changes.   Patient Stated Goals Pt's goal for therapy is to get a little more steady with walking.   Currently in Pain? Yes   Pain Score 4    Pain Location Back   Pain Orientation Lower   Pain Descriptors / Indicators Sore;Aching   Pain Type Acute pain   Pain Onset More than a month ago   Pain Frequency Intermittent   Aggravating Factors  sleeping on wedge   Pain Relieving Factors Tylenol helps   Multiple Pain Sites No          [facility] Adult PT Treatment/Exercise - 09/25/15 0001    Lumbar Exercises: Stretches   Active Hamstring Stretch 2 reps;60 seconds;Other (comment)  in supine with black theraband   Single Knee to Chest Stretch 3 reps;30 seconds   Double Knee to Chest Stretch 2 reps;20 seconds   Lower Trunk Rotation 5 reps;10 seconds   Pelvic Tilt Other (comment)  10 reps;also performed 10 reps seated edge of mat ant/post   Lumbar Exercises: Supine   Other Supine Lumbar Exercises supine with 1 LE in hooklying and extension of other LE with ankle dorsiflexion x 10   Other Supine Lumbar Exercises Bridging x 10 with 3 second hold             PT Education - 09/25/15 1955    Education provided Yes   Education Details HEP;performing back exercises before bed and as way to decrease stiffness during night and in morning;pillows under knees for decreased pressure on back   Person(s) Educated Patient   Methods Explanation;Demonstration;Verbal cues;Handout   Comprehension Verbalized understanding          PT Short Term Goals - 08/09/15 1334    PT SHORT TERM GOAL #1   Title Pt will perform HEP with family supervision for improved balance, transfers, and gait.  TARGET 08/11/15   Time 4   Period Weeks   Status Achieved   PT SHORT TERM GOAL #2   Title Pt will perform transfers from 18" surfaces and below with minimal use of hands, using proper and safe technique, 8 of 10 trials, for improved transfer safety.  Time 4   Period Weeks   Status Partially Met   PT SHORT TERM GOAL #3   Title Pt will improve 4-square step test to less than 14 seconds with no loss of balance.   Baseline 11.56 sec 08/09/15   Time 4   Period Weeks   Status Achieved   PT SHORT TERM GOAL #4   Title Pt will verbalize understanding of fall prevention within the home environment.   Time 4   Period Weeks   Status Achieved           PT Long Term Goals - 09/17/15 2206    PT LONG TERM GOAL #1   Title Pt will improve Mini-Bestest score to at least 22/28 for decreased fall risk.   Baseline 24/28 on 09/11/15   Time 8   Period Weeks   Status Achieved   PT LONG TERM GOAL #2   Title Pt will demonstrate proper, safe technique for pushing/pulling chairs away from table, modified independently, 4 of 5 trials.  EXTEND/GOAL ONGOING:  TARGET 10/12/15   Time  8   Period Weeks   Status On-going   PT LONG TERM GOAL #3   Title Pt will ambulate at least 500 ft with appropriate assistive device modified independently, without loss of balance.  GOAL ONGOING:  TARGET 10/12/15   Time 8   Period Weeks   Status On-going   PT LONG TERM GOAL #4   Title Pt will perform floor>stand transfer with minimal UE support, modified independently, for safe fall recovery.  GOAL ONGOING 10/12/15   Status On-going               Plan - 09/25/15 1956    Clinical Impression Statement Pt reports decreased back pain and stiffness after therapy session.  Continue PT per POC.   Rehab Potential Good   PT Frequency 2x / week   PT Duration 4 weeks  per recert 01/15/31   PT Treatment/Interventions ADLs/Self Care Home Management;Therapeutic exercise;Therapeutic activities;Functional mobility training;Gait training;DME Instruction;Balance training;Neuromuscular re-education;Patient/family education   PT Next Visit Plan Follow up on back exercises;compliant surfaces and balance   Consulted and Agree with Plan of Care Patient      Patient will benefit from skilled therapeutic intervention in order to improve the following deficits and impairments:  Abnormal gait, Decreased balance, Decreased mobility, Decreased safety awareness, Decreased coordination, Difficulty walking, Impaired flexibility, Postural dysfunction  Visit Diagnosis: Other abnormalities of gait and mobility     Problem List Patient Active Problem List   Diagnosis Date Noted  . 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 Depression 11/22/2013  . 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

## 2015-09-25 NOTE — Patient Instructions (Signed)
   Continue practice conversation focusing on loudness   Loud /a/  Speech tasks   Big breath - think shout!

## 2015-09-25 NOTE — Therapy (Signed)
[facility] [address] [place], [place], [address] Phone: [phone]   Fax:  [phone]  Occupational Therapy Treatment  Patient Details  Name: Elizabeth Mcintosh MRN: [id number] Date of Birth: [birth year] No Data Recorded  Encounter Date: 09/25/2015      OT End of Session - 09/25/15 1605    Visit Number 10   Number of Visits 18   Date for OT Re-Evaluation 11/23/15   Authorization Type UHC Medicare, no auth, no visit limit, G-code needed   Authorization Time Period Pt is scheduled x 5  more weeks, may be ready for d/c at that time   Authorization - Visit Number 10   Authorization - Number of Visits 10   OT Start Time 1149   OT Stop Time 1230   OT Time Calculation (min) 41 min   Activity Tolerance Patient tolerated treatment well      Past Medical History  Diagnosis Date  . Aneurysm ([place])     s/p clips  . Glaucoma   . Parkinson's disease ([place])   . Sleep apnea   . Hypotension     Past Surgical History  Procedure Laterality Date  . Craniotomy    . Aneurym clipping      There were no vitals filed for this visit.      Subjective Assessment - 09/25/15 1150    Subjective  low back pain   Pertinent History Parkinsonism diagnosis 2015, MSA diagnosis 2016, hx of aneurysm clipping, glaucoma, orthostatic hypotension, hx of multiple falls   Patient Stated Goals improve coordination, ROM, ADLs   Currently in Pain? No/denies  pain is resolved after PT         Therapist checked progress towards goals and updated goals. See goals- New plan of care sent to MD.                [facility]) - 09/25/15 1601    PWR! exercises Moves in sitting   PWR! Up 10   PWR! Rock 10   PWR! Twist 10   PWR! Step 10   Comments min v.c. for technique               OT Short Term Goals - 09/25/15 1150    OT SHORT TERM GOAL #1   Title Pt will be independent with updated HEP. (ck 10/17/15)   Time 3   Period  Weeks   Status On-going  needs reinforcement, min v.c.   OT SHORT TERM GOAL #2   Title Pt will write at least 3 sentences with 100% legibility and only min decr in size.    Time 3   Status On-going  min-mod decrease in letter size, 100% legibility   OT SHORT TERM GOAL #3   Title Pt will demo at least 125* R shoulder flex for functional reaching with -20* elbow extension or less.   Status Achieved  140 with -10 elbow extension   OT SHORT TERM GOAL #4   Title Pt will improve ability/ease with donning/doffing jacket as shown by improving time on PPT#4 by at least 8sec.   Time 4   Period Weeks   Status Achieved   OT SHORT TERM GOAL #5   Title Pt will perform PPT#2(simulated feeding) in 13  secs or less   Baseline 15.57 on 09/25/15   Time 3   Period Weeks   Status New           OT Long Term Goals -  09/25/15 1154    OT LONG TERM GOAL #1   Title Pt will verbalize understanding of updated AE/strategies to increase ease/independence with ADLs/IADLs prn.--ck 11/02/15   Time 6   Period Weeks   Status On-going  needs to practice with TED donning device, needs reinforement   OT LONG TERM GOAL #2   Title Pt will improve coordination/functional reaching for ADLs as shown by improving score on box and blocks test by at least 5 blocks bilaterally.   Baseline R-38 blocks, L-38 blocks   Time --   Period Weeks   Status Achieved  LUE 48, RUE 46 blocks   OT LONG TERM GOAL #3   Title Pt will demo at least 140* R shoulder flex for functional reaching with elbow ext WNL.   Baseline 125 on 09/25/15   Time 6   Period Weeks   Status On-going  08/14/15  met original goal and revised   OT LONG TERM GOAL #4   Title Pt will improve coordination for ADLs as shown by improving time on 9-hole peg test by at least 5 sec with RUE.   Baseline R-42.94sec   Time --   Period Weeks   Status Achieved  29.65 secs   OT LONG TERM GOAL #5   Title Pt will improve ability/ease with donning/doffing jacket as  shown by improving time on PPT#4 in 30sec or less.   Baseline 55.41sec   Time --   Period Weeks   Status Achieved  09/25/15 12.60 secs   Long Term Additional Goals   Additional Long Term Goals Yes   OT LONG TERM GOAL #6   Title Pt will perform 3 button /unbutton in 35 secs or less    Baseline 42.75 secs   Time 6   Period Weeks   Status New               Plan - 09/25/15 1607    Clinical Impression Statement Pt demonstrates good progress towards long and short term goals. Pt had a break from therapy for several weeks due to scheduling issues. Pt can benefit from continued skiled occupational therapy to address the following deficits: decreased coordiantion, decreased balance, decreased A/ROM, rigidity. in order to maximize safety and indpendence with ADLs/IADLs.   Rehab Potential Good   OT Frequency 2x / week   OT Duration 6 weeks   OT Treatment/Interventions Self-care/ADL training;Neuromuscular education;Therapist, nutritional;Therapeutic exercise   Plan AE for donning TEDs   OT Home Exercise Plan Education issued:  PWR! hands HEP, Coordination HEP 08/07/15   Consulted and Agree with Plan of Care Patient      Patient will benefit from skilled therapeutic intervention in order to improve the following deficits and impairments:  Decreased coordination, Decreased activity tolerance, Impaired tone, Impaired UE functional use, Decreased mobility, Decreased balance, Decreased range of motion  Visit Diagnosis: Abnormal posture - Plan: Ot plan of care cert/re-cert  Other lack of coordination - Plan: Ot plan of care cert/re-cert  Other symptoms and signs involving the musculoskeletal system - Plan: Ot plan of care cert/re-cert  Other symptoms and signs involving the nervous system - Plan: Ot plan of care cert/re-cert  Stiffness of right shoulder, not elsewhere classified - Plan: Ot plan of care cert/re-cert      G-Codes - 16/10/96 1611    Functional Assessment Tool Used  PPT#4 12.60 secs, min-mod micrographia, R shoulder flexion 125. 3     (button/ unbutton 42 .75 secs)   Functional Limitation Self  care   Self Care Current Status [phone]) At least 20 percent but less than 40 percent impaired, limited or restricted   Self Care Goal Status ([id number]) At least 1 percent but less than 20 percent impaired, limited or restricted      Problem List Patient Active Problem List   Diagnosis Date Noted  . 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 Depression 11/22/2013  . 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

## 2015-09-25 NOTE — Patient Instructions (Addendum)
  Provided patient with Exercises from Basic Back Exercise Packet consisting of :  Bridging Single knee to chest Double knee to chest Trunk rotation Supine Pelvic tilt Supine and seated hamstring stretch Seated Pelvic tilt Standing Trunk extension

## 2015-09-27 ENCOUNTER — Encounter: Payer: Medicare Other | Admitting: Occupational Therapy

## 2015-09-27 ENCOUNTER — Ambulatory Visit: Payer: Medicare Other | Admitting: Physical Therapy

## 2015-10-02 ENCOUNTER — Ambulatory Visit: Payer: Medicare Other | Admitting: Speech Pathology

## 2015-10-02 ENCOUNTER — Ambulatory Visit: Payer: Medicare Other | Admitting: Occupational Therapy

## 2015-10-02 DIAGNOSIS — R293 Abnormal posture: Secondary | ICD-10-CM

## 2015-10-02 DIAGNOSIS — R471 Dysarthria and anarthria: Secondary | ICD-10-CM

## 2015-10-02 DIAGNOSIS — R29898 Other symptoms and signs involving the musculoskeletal system: Secondary | ICD-10-CM

## 2015-10-02 DIAGNOSIS — R278 Other lack of coordination: Secondary | ICD-10-CM

## 2015-10-02 DIAGNOSIS — R29818 Other symptoms and signs involving the nervous system: Secondary | ICD-10-CM

## 2015-10-02 NOTE — Therapy (Signed)
[facility] [address] [place], [place], [address] Phone: [phone]   Fax:  [phone]  Speech Language Pathology Treatment  Patient Details  Name: LENNETTE FADER MRN: [id number] Date of Birth: [birth year] Referring Provider: Dr. Wells Guiles Tat  Encounter Date: 10/02/2015      End of Session - 10/02/15 1450    Visit Number 13   Number of Visits [place] requested   SLP Start Time 4010   SLP Stop Time  2725   SLP Time Calculation (min) 41 min      Past Medical History  Diagnosis Date  . Aneurysm ([place])     s/p clips  . Glaucoma   . Parkinson's disease ([place])   . Sleep apnea   . Hypotension     Past Surgical History  Procedure Laterality Date  . Craniotomy    . Aneurym clipping      There were no vitals filed for this visit.      Subjective Assessment - 10/02/15 1411    Subjective "I went to a dinner party with 30 women last night and had a sinking spell"               ADULT SLP TREATMENT - 10/02/15 1411    General Information   Behavior/Cognition Alert;Cooperative;Pleasant mood   Treatment Provided   Treatment provided Cognitive-Linquistic   Pain Assessment   Pain Assessment No/denies pain   Cognitive-Linquistic Treatment   Treatment focused on Dysarthria   Skilled Treatment Loud /a/ to recalibrated volume with average of 86dB with supervision cues. Structured speech tasks with average of  71dB. Simple conversation average 69dB with occasional min A.   Assessment / Recommendations / Plan   Plan Continue with current plan of care   Progression Toward Goals   Progression toward goals Progressing toward goals            SLP Short Term Goals - 10/02/15 1449    SLP SHORT TERM GOAL #1   Title Pt will demonstrate loud /a/ of 85 dB over 3 sessions with rare min A   Status Achieved   SLP SHORT TERM GOAL #2   Title Pt will average 70dB during structured speech  tasks with rare min A   Status Not Met   SLP SHORT TERM GOAL #3   Title Pt will average 70dB over 5 minute conversation with occasional min A   Status Partially Met          SLP Long Term Goals - 10/02/15 1450    SLP LONG TERM GOAL #1   Title Pt will maintain average of 69dB over 8 munute simple-mod complex conversation with occasional min A   Time 2  (all LTGs beginning week of 09-17-15)   Period Weeks   Status Revised   SLP LONG TERM GOAL #2   Title Pt will demo 100% intelligibility in 8 minute conversation in a mod-max noisy environment with rare min A   Time 2   Period Weeks   Status Revised   SLP LONG TERM GOAL #3   Title pt will maintain loud /a/ at average 85dB over 4 sessions   Time 2   Period Weeks   Status Revised          Plan - 10/02/15 1444    Clinical Impression Statement Pt with improved loudness  during structured speech tasks and min A to maintain loudness during simple conversation. Continue skilled  ST to maximize intelligiblity and carryover of compensations for dysarthria.    Speech Therapy Frequency 2x / week   Duration 4 weeks   Treatment/Interventions Compensatory techniques;Internal/external aids;SLP instruction and feedback;Functional tasks;Patient/family education   Potential to Achieve Goals Good   Potential Considerations Severity of impairments   Consulted and Agree with Plan of Care Patient      Patient will benefit from skilled therapeutic intervention in order to improve the following deficits and impairments:   Dysarthria and anarthria    Problem List Patient Active Problem List   Diagnosis Date Noted  . 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 Depression 11/22/2013  . 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 [place], [place], [address] Phone: [phone]   Fax:  [phone]   Name: MAHAYLA HADDAWAY MRN: [id number] Date of Birth: 22-Jun-1945

## 2015-10-02 NOTE — Therapy (Signed)
[facility] [address] [place], [place], [address] Phone: [phone]   Fax:  [phone]  Speech Language Pathology Treatment  Patient Details  Name: Elizabeth Mcintosh MRN: [id number] Date of Birth: July 13, 1945 Referring Provider: Dr. Wells Guiles Tat  Encounter Date: 09/11/2015      End of Session - 10/02/15 1450    Visit Number 13   Number of Visits [place] requested   SLP Start Time 1025   SLP Stop Time  8527   SLP Time Calculation (min) 41 min      Past Medical History  Diagnosis Date  . Aneurysm ([place])     s/p clips  . Glaucoma   . Parkinson's disease ([place])   . Sleep apnea   . Hypotension     Past Surgical History  Procedure Laterality Date  . Craniotomy    . Aneurym clipping      There were no vitals filed for this visit.      Subjective Assessment - 10/02/15 1411    Subjective "I went to a dinner party with 30 women last night and had a sinking spell"               ADULT SLP TREATMENT - 10/02/15 1411    General Information   Behavior/Cognition Alert;Cooperative;Pleasant mood   Treatment Provided   Treatment provided Cognitive-Linquistic   Pain Assessment   Pain Assessment No/denies pain   Cognitive-Linquistic Treatment   Treatment focused on Dysarthria   Skilled Treatment Loud /a/ to recalibrated volume with average of 86dB with supervision cues. Structured speech tasks with average of  71dB. Simple conversation average 69dB with occasional min A.   Assessment / Recommendations / Plan   Plan Continue with current plan of care   Progression Toward Goals   Progression toward goals Progressing toward goals            SLP Short Term Goals - 10/02/15 1449    SLP SHORT TERM GOAL #1   Title Pt will demonstrate loud /a/ of 85 dB over 3 sessions with rare min A   Status Achieved   SLP SHORT TERM GOAL #2   Title Pt will average 70dB during structured speech  tasks with rare min A   Status Not Met   SLP SHORT TERM GOAL #3   Title Pt will average 70dB over 5 minute conversation with occasional min A   Status Partially Met          SLP Long Term Goals - 10/02/15 1450    SLP LONG TERM GOAL #1   Title Pt will maintain average of 69dB over 8 munute simple-mod complex conversation with occasional min A   Time 2  (all LTGs beginning week of 09-17-15)   Period Weeks   Status Revised   SLP LONG TERM GOAL #2   Title Pt will demo 100% intelligibility in 8 minute conversation in a mod-max noisy environment with rare min A   Time 2   Period Weeks   Status Revised   SLP LONG TERM GOAL #3   Title pt will maintain loud /a/ at average 85dB over 4 sessions   Time 2   Period Weeks   Status Revised          Plan - 10/02/15 1444    Clinical Impression Statement Pt with improved loudness  during structured speech tasks and min A to maintain loudness during simple conversation. Continue skilled  ST to maximize intelligiblity and carryover of compensations for dysarthria.    Speech Therapy Frequency 2x / week   Duration 4 weeks   Treatment/Interventions Compensatory techniques;Internal/external aids;SLP instruction and feedback;Functional tasks;Patient/family education   Potential to Achieve Goals Good   Potential Considerations Severity of impairments   Consulted and Agree with Plan of Care Patient      Patient will benefit from skilled therapeutic intervention in order to improve the following deficits and impairments:   Dysarthria and anarthria    Problem List Patient Active Problem List   Diagnosis Date Noted  . 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 Depression 11/22/2013  . 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 [place],  [place], [address] Phone: [phone]   Fax:  [phone]   Name: Elizabeth Mcintosh MRN: [id number] Date of Birth: Apr 06, 1946

## 2015-10-02 NOTE — Therapy (Signed)
[facility] [address] [place], [place], [address] Phone: [phone]   Fax:  [phone]  Occupational Therapy Treatment  Patient Details  Name: Elizabeth Mcintosh MRN: [id number] Date of Birth: 03-Oct-1945 No Data Recorded  Encounter Date: 10/02/2015      OT End of Session - 10/02/15 1520    Visit Number 11   Number of Visits 18   Date for OT Re-Evaluation 11/23/15   Authorization Type [employer] Medicare, no auth, no visit limit, G-code needed   Authorization Time Period Pt is scheduled x 5  more weeks, may be ready for d/c at that time   Authorization - Visit Number 11   Authorization - Number of Visits 20   OT Start Time 1455   OT Stop Time 1535   OT Time Calculation (min) 40 min   Activity Tolerance Patient tolerated treatment well      Past Medical History  Diagnosis Date  . Aneurysm ([place])     s/p clips  . Glaucoma   . Parkinson's disease ([place])   . Sleep apnea   . Hypotension     Past Surgical History  Procedure Laterality Date  . Craniotomy    . Aneurym clipping      There were no vitals filed for this visit.      Subjective Assessment - 10/02/15 1458    Subjective  had a spell where she was a little tired and had to sit down--BP related   Pertinent History Parkinsonism diagnosis 2015, MSA diagnosis 2016, hx of aneurysm clipping, glaucoma, orthostatic hypotension, hx of multiple falls   Patient Stated Goals improve coordination, ROM, ADLs   Currently in Pain? Yes   Pain Score 3    Pain Location Back   Pain Orientation Lower   Pain Descriptors / Indicators Aching;Sore   Pain Type Acute pain   Aggravating Factors  sleeping on wedge    Pain Relieving Factors tyenol helps        Treatment:  Sliding cards across table with mod cueing and difficulty for PWR! Hands and incr time/multiple attempts at times (each hand, but incr difficulty with R vs. L)  Red putty exercises for gross finger extension  each hand with min cues.  Issued putty for home and instructed pt in how to perform individual finger extension at home as well.  Placing grooved pegs in pegboard with R hand with min cues and difficulty.  Pt instructed/cued in use of PWR! Hands with grasp.                        OT Short Term Goals - 09/25/15 1150    OT SHORT TERM GOAL #1   Title Pt will be independent with updated HEP. (ck 10/17/15)   Time 3   Period Weeks   Status On-going  needs reinforcement, min v.c.   OT SHORT TERM GOAL #2   Title Pt will write at least 3 sentences with 100% legibility and only min decr in size.    Time 3   Status On-going  min-mod decrease in letter size, 100% legibility   OT SHORT TERM GOAL #3   Title Pt will demo at least 125* R shoulder flex for functional reaching with -20* elbow extension or less.   Status Achieved  140 with -10 elbow extension   OT SHORT TERM GOAL #4   Title Pt will improve ability/ease with donning/doffing jacket as shown by improving time  on PPT#4 by at least 8sec.   Time 4   Period Weeks   Status Achieved   OT SHORT TERM GOAL #5   Title Pt will perform PPT#2(simulated feeding) in 13  secs or less   Baseline 15.57 on 09/25/15   Time 3   Period Weeks   Status New           OT Long Term Goals - 09/25/15 1154    OT LONG TERM GOAL #1   Title Pt will verbalize understanding of updated AE/strategies to increase ease/independence with ADLs/IADLs prn.--ck 11/02/15   Time 6   Period Weeks   Status On-going  needs to practice with TED donning device, needs reinforement   OT LONG TERM GOAL #2   Title Pt will improve coordination/functional reaching for ADLs as shown by improving score on box and blocks test by at least 5 blocks bilaterally.   Baseline R-38 blocks, L-38 blocks   Time --   Period Weeks   Status Achieved  LUE 48, RUE 46 blocks   OT LONG TERM GOAL #3   Title Pt will demo at least 140* R shoulder flex for functional reaching with  elbow ext WNL.   Baseline 125 on 09/25/15   Time 6   Period Weeks   Status On-going  08/14/15  met original goal and revised   OT LONG TERM GOAL #4   Title Pt will improve coordination for ADLs as shown by improving time on 9-hole peg test by at least 5 sec with RUE.   Baseline R-42.94sec   Time --   Period Weeks   Status Achieved  29.65 secs   OT LONG TERM GOAL #5   Title Pt will improve ability/ease with donning/doffing jacket as shown by improving time on PPT#4 in 30sec or less.   Baseline 55.41sec   Time --   Period Weeks   Status Achieved  09/25/15 12.60 secs   Long Term Additional Goals   Additional Long Term Goals Yes   OT LONG TERM GOAL #6   Title Pt will perform 3 button /unbutton in 35 secs or less    Baseline 42.75 secs   Time 6   Period Weeks   Status New               Plan - 10/02/15 1603    Clinical Impression Statement Pt demo good progress towards goals with improving coordination, particulary with repetition.   Plan AE for donning TEDs   OT Home Exercise Plan Education issued:  PWR! hands HEP, Coordination HEP 08/07/15   Consulted and Agree with Plan of Care Patient      Patient will benefit from skilled therapeutic intervention in order to improve the following deficits and impairments:     Visit Diagnosis: Other lack of coordination  Other symptoms and signs involving the nervous system  Other symptoms and signs involving the musculoskeletal system  Abnormal posture    Problem List Patient Active Problem List   Diagnosis Date Noted  . 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 Depression 11/22/2013  . 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

## 2015-10-02 NOTE — Patient Instructions (Signed)
  Loud "AH!" 5x twice a day  Read aloud with loud voice - 5 minutes  Practice loud conversation with Mr. Sallee LangeSoles or other family member

## 2015-10-04 ENCOUNTER — Ambulatory Visit: Payer: Medicare Other | Admitting: Occupational Therapy

## 2015-10-04 ENCOUNTER — Ambulatory Visit: Payer: Medicare Other | Admitting: Speech Pathology

## 2015-10-04 ENCOUNTER — Ambulatory Visit: Payer: Medicare Other | Admitting: Physical Therapy

## 2015-10-09 ENCOUNTER — Ambulatory Visit: Payer: 59 | Attending: Neurology | Admitting: Occupational Therapy

## 2015-10-09 ENCOUNTER — Ambulatory Visit: Payer: 59 | Admitting: Physical Therapy

## 2015-10-09 ENCOUNTER — Ambulatory Visit: Payer: 59 | Admitting: Speech Pathology

## 2015-10-09 DIAGNOSIS — R293 Abnormal posture: Secondary | ICD-10-CM | POA: Diagnosis present

## 2015-10-09 DIAGNOSIS — R2689 Other abnormalities of gait and mobility: Secondary | ICD-10-CM | POA: Diagnosis present

## 2015-10-09 DIAGNOSIS — R29818 Other symptoms and signs involving the nervous system: Secondary | ICD-10-CM | POA: Diagnosis present

## 2015-10-09 DIAGNOSIS — R29898 Other symptoms and signs involving the musculoskeletal system: Secondary | ICD-10-CM | POA: Diagnosis present

## 2015-10-09 DIAGNOSIS — R471 Dysarthria and anarthria: Secondary | ICD-10-CM | POA: Diagnosis present

## 2015-10-09 DIAGNOSIS — M25611 Stiffness of right shoulder, not elsewhere classified: Secondary | ICD-10-CM | POA: Diagnosis present

## 2015-10-09 DIAGNOSIS — R278 Other lack of coordination: Secondary | ICD-10-CM

## 2015-10-09 NOTE — Therapy (Signed)
[facility] [address] [place], [place], [address] Phone: [phone]   Fax:  [phone]  Occupational Therapy Treatment  Patient Details  Name: Elizabeth Mcintosh MRN: [id number] Date of Birth: 1946-01-26 No Data Recorded  Encounter Date: 10/09/2015      OT End of Session - 10/09/15 1115    Visit Number 12   Number of Visits 18   Date for OT Re-Evaluation 11/23/15   Authorization Type [employer] Medicare, no auth, no visit limit, G-code needed   Authorization - Visit Number 12   Authorization - Number of Visits 20   OT Start Time 1110   OT Stop Time 1150   OT Time Calculation (min) 40 min   Activity Tolerance Patient tolerated treatment well      Past Medical History  Diagnosis Date  . Aneurysm ([place])     s/p clips  . Glaucoma   . Parkinson's disease ([place])   . Sleep apnea   . Hypotension     Past Surgical History  Procedure Laterality Date  . Craniotomy    . Aneurym clipping      There were no vitals filed for this visit.      Subjective Assessment - 10/09/15 1113    Subjective  Pt reports she has had several falls   Pertinent History Parkinsonism diagnosis 2015, MSA diagnosis 2016, hx of aneurysm clipping, glaucoma, orthostatic hypotension, hx of multiple falls   Patient Stated Goals improve coordination, ROM, ADLs   Currently in Pain? No/denies                    Arm bike x 5 mins level 1 for conditioning, pt maintained 30RPM Placing grooved pegs in pegboard for increased fine motor coordination, then removing using PWR! Step Flicking playing cards one at a time with right then left UE to promote PWR! Hands and finger extension, min v.c. For intensity, larger movement          OT Education - 10/09/15 1743    Education provided Yes   Education Details theraband exercises   Person(s) Educated Patient   Methods Explanation;Demonstration;Verbal cues;Handout   Comprehension Verbalized  understanding;Returned demonstration;Verbal cues required          OT Short Term Goals - 09/25/15 1150    OT SHORT TERM GOAL #1   Title Pt will be independent with updated HEP. (ck 10/17/15)   Time 3   Period Weeks   Status On-going  needs reinforcement, min v.c.   OT SHORT TERM GOAL #2   Title Pt will write at least 3 sentences with 100% legibility and only min decr in size.    Time 3   Status On-going  min-mod decrease in letter size, 100% legibility   OT SHORT TERM GOAL #3   Title Pt will demo at least 125* R shoulder flex for functional reaching with -20* elbow extension or less.   Status Achieved  140 with -10 elbow extension   OT SHORT TERM GOAL #4   Title Pt will improve ability/ease with donning/doffing jacket as shown by improving time on PPT#4 by at least 8sec.   Time 4   Period Weeks   Status Achieved   OT SHORT TERM GOAL #5   Title Pt will perform PPT#2(simulated feeding) in 13  secs or less   Baseline 15.57 on 09/25/15   Time 3   Period Weeks   Status New  OT Long Term Goals - 09/25/15 1154    OT LONG TERM GOAL #1   Title Pt will verbalize understanding of updated AE/strategies to increase ease/independence with ADLs/IADLs prn.--ck 11/02/15   Time 6   Period Weeks   Status On-going  needs to practice with TED donning device, needs reinforement   OT LONG TERM GOAL #2   Title Pt will improve coordination/functional reaching for ADLs as shown by improving score on box and blocks test by at least 5 blocks bilaterally.   Baseline R-38 blocks, L-38 blocks   Time --   Period Weeks   Status Achieved  LUE 48, RUE 46 blocks   OT LONG TERM GOAL #3   Title Pt will demo at least 140* R shoulder flex for functional reaching with elbow ext WNL.   Baseline 125 on 09/25/15   Time 6   Period Weeks   Status On-going  08/14/15  met original goal and revised   OT LONG TERM GOAL #4   Title Pt will improve coordination for ADLs as shown by improving time on  9-hole peg test by at least 5 sec with RUE.   Baseline R-42.94sec   Time --   Period Weeks   Status Achieved  29.65 secs   OT LONG TERM GOAL #5   Title Pt will improve ability/ease with donning/doffing jacket as shown by improving time on PPT#4 in 30sec or less.   Baseline 55.41sec   Time --   Period Weeks   Status Achieved  09/25/15 12.60 secs   Long Term Additional Goals   Additional Long Term Goals Yes   OT LONG TERM GOAL #6   Title Pt will perform 3 button /unbutton in 35 secs or less    Baseline 42.75 secs   Time 6   Period Weeks   Status New               Plan - 10/09/15 1147    Clinical Impression Statement Pt is progressing towards goals. She returned demonstration of updates to HEP.   Rehab Potential Good   OT Frequency 2x / week   OT Duration 6 weeks   Plan AE for donning TEDS   OT Home Exercise Plan Education issued:  PWR! hands HEP, Coordination HEP 08/07/15   Consulted and Agree with Plan of Care Patient      Patient will benefit from skilled therapeutic intervention in order to improve the following deficits and impairments:  Decreased coordination, Decreased activity tolerance, Impaired tone, Impaired UE functional use, Decreased mobility, Decreased balance, Decreased range of motion  Visit Diagnosis: Other lack of coordination  Other symptoms and signs involving the nervous system  Other symptoms and signs involving the musculoskeletal system  Abnormal posture  Stiffness of right shoulder, not elsewhere classified    Problem List Patient Active Problem List   Diagnosis Date Noted  . 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 Depression 11/22/2013  . 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   

## 2015-10-09 NOTE — Patient Instructions (Signed)
   Strengthening: Resisted Extension   Seated Hold tubing in _each___ hand(s), arm forward. Pull arm back, elbow straight. Repeat _15___ times per set. Do _1-2___ sessions per day, every other day. Repeat with elbows bent 10-15 reps 1-2 x day   Resisted Horizontal Abduction: Bilateral   Sit  tubing in both hands, arms out in front. Keeping arms straight, pinch shoulder blades together and stretch arms out. Repeat _10___ times per set. Do _1-2___ sessions per day, every other day.     Copyright  [employer]. All rights reserved.

## 2015-10-09 NOTE — Patient Instructions (Signed)
  Practice conversation focused on loudness over 10 minutes

## 2015-10-09 NOTE — Therapy (Signed)
[facility] [address] [place], [place], [address] Phone: [phone]   Fax:  [phone]  Speech Language Pathology Treatment  Patient Details  Name: Elizabeth Mcintosh MRN: [id number] Date of Birth: 05-03-1946 Referring Provider: Dr. Wells Guiles Tat  Encounter Date: 10/09/2015      End of Session - 10/09/15 1209    Visit Number 14   Number of Visits 17   Date for SLP Re-Evaluation 09/06/15   Authorization Type [place] requested   SLP Start Time 1017   SLP Stop Time  1103   SLP Time Calculation (min) 46 min      Past Medical History  Diagnosis Date  . Aneurysm ([place])     s/p clips  . Glaucoma   . Parkinson's disease ([place])   . Sleep apnea   . Hypotension     Past Surgical History  Procedure Laterality Date  . Craniotomy    . Aneurym clipping      There were no vitals filed for this visit.      Subjective Assessment - 10/09/15 1027    Subjective "I woke up late and didn't do my /a/ today"   Currently in Pain? No/denies               ADULT SLP TREATMENT - 10/09/15 1028    General Information   Behavior/Cognition Alert;Cooperative;Pleasant mood   Treatment Provided   Treatment provided Cognitive-Linquistic   Pain Assessment   Pain Assessment No/denies pain   Cognitive-Linquistic Treatment   Treatment focused on Dysarthria   Skilled Treatment Recalibrated loudness with loud /a/ average 85dB. Simple conversation  average  68dB with occasional min cues to maintain loudness over 12 minutes conversation. Out side of therapy room, walking in gym pt intelligible over 5 minute conversation.     Assessment / Recommendations / Plan   Plan Continue with current plan of care   Progression Toward Goals   Progression toward goals Progressing toward goals          SLP Education - 10/09/15 1100    Education provided Yes   Education Details Coninue to use breath support to increase loudness   Person(s)  Educated Patient   Methods Explanation;Demonstration;Verbal cues;Handout   Comprehension Verbalized understanding          SLP Short Term Goals - 10/09/15 1208    SLP SHORT TERM GOAL #1   Title Pt will demonstrate loud /a/ of 85 dB over 3 sessions with rare min A   Status Achieved   SLP SHORT TERM GOAL #2   Title Pt will average 70dB during structured speech tasks with rare min A   Status Not Met   SLP SHORT TERM GOAL #3   Title Pt will average 70dB over 5 minute conversation with occasional min A   Status Partially Met          SLP Long Term Goals - 10/09/15 1209    SLP LONG TERM GOAL #1   Title Pt will maintain average of 69dB over 8 munute simple-mod complex conversation with occasional min A   Time 1  (all LTGs beginning week of 09-17-15)   Period Weeks   Status Revised   SLP LONG TERM GOAL #2   Title Pt will demo 100% intelligibility in 8 minute conversation in a mod-max noisy environment with rare min A   Time 1   Period Weeks   Status Revised   SLP LONG TERM GOAL #3  Title pt will maintain loud /a/ at average 85dB over 4 sessions   Time 1   Period Weeks   Status Revised          Plan - 10/09/15 1101    Clinical Impression Statement Pt requried min A to maintain loudness over conversations, and min cues for breath support to increase volume. Continue skilled ST to maximize carryover of loudness and intellgibility.    Speech Therapy Frequency 2x / week   Potential to Achieve Goals Good   Potential Considerations Severity of impairments   Consulted and Agree with Plan of Care Patient      Patient will benefit from skilled therapeutic intervention in order to improve the following deficits and impairments:   Dysarthria    Problem List Patient Active Problem List   Diagnosis Date Noted  . 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 Depression 11/22/2013  . Akinetic rigid Parkinsons  disease 09/20/2013    Davontae Prusinski, Annye Rusk MS, CCC-SLP 10/09/2015, 12:11 PM  [facility] [address] [place], [place], [address] Phone: [phone]   Fax:  [phone]   Name: Elizabeth Mcintosh MRN: [id number] Date of Birth: 14-Aug-1945

## 2015-10-09 NOTE — Therapy (Signed)
[facility] [address] [place], [place], [address] Phone: [phone]   Fax:  [phone]  Physical Therapy Treatment  Patient Details  Name: Elizabeth Mcintosh MRN: [id number] Date of Birth: [birth year] Referring Provider: Wells Guiles Tat  Encounter Date: 10/09/2015      PT End of Session - 10/09/15 1311    Visit Number 13   Number of Visits 17   Date for PT Re-Evaluation 62/83/66  per recert dated 07/18/45   Authorization Type UHC Medicare-Gcode every 10th visit   PT Start Time 0934   PT Stop Time 1014   PT Time Calculation (min) 40 min   Equipment Utilized During Treatment Gait belt   Activity Tolerance Patient tolerated treatment well   Behavior During Therapy [facility] for tasks assessed/performed      Past Medical History  Diagnosis Date  . Aneurysm ([place])     s/p clips  . Glaucoma   . Parkinson's disease ([place])   . Sleep apnea   . Hypotension     Past Surgical History  Procedure Laterality Date  . Craniotomy    . Aneurym clipping      There were no vitals filed for this visit.      Subjective Assessment - 10/09/15 0936    Subjective Had several falls last week-don't know what happened-"just kind of crumbled", "sinking spells".  Have not yet talked to Dr. Carles Collet about that.   Patient Stated Goals Pt's goal for therapy is to get a little more steady with walking.   Currently in Pain? No/denies            [facility] PT Assessment - 10/09/15 0948    Functional Gait  Assessment   Gait assessed  Yes   Gait Level Surface Walks 20 ft in less than 7 sec but greater than 5.5 sec, uses assistive device, slower speed, mild gait deviations, or deviates 6-10 in outside of the 12 in walkway width.  6.22 sec   Change in Gait Speed Able to smoothly change walking speed without loss of balance or gait deviation. Deviate no more than 6 in outside of the 12 in walkway width.   Gait with Horizontal Head Turns Performs head turns  smoothly with slight change in gait velocity (eg, minor disruption to smooth gait path), deviates 6-10 in outside 12 in walkway width, or uses an assistive device.   Gait with Vertical Head Turns Performs task with slight change in gait velocity (eg, minor disruption to smooth gait path), deviates 6 - 10 in outside 12 in walkway width or uses assistive device   Gait and Pivot Turn Pivot turns safely in greater than 3 sec and stops with no loss of balance, or pivot turns safely within 3 sec and stops with mild imbalance, requires small steps to catch balance.  3.04 sec   Step Over Obstacle Is able to step over one shoe box (4.5 in total height) but must slow down and adjust steps to clear box safely. May require verbal cueing.   Gait with Narrow Base of Support Is able to ambulate for 10 steps heel to toe with no staggering.   Gait with Eyes Closed Walks 20 ft, slow speed, abnormal gait pattern, evidence for imbalance, deviates 10-15 in outside 12 in walkway width. Requires more than 9 sec to ambulate 20 ft.  13.1   Ambulating Backwards Walks 20 ft, uses assistive device, slower speed, mild gait deviations, deviates 6-10 in outside 12 in walkway width.  14.14 sec   Steps Alternating feet, no rail.   Total Score 21   FGA comment: Scores <22/30 indicate increased risk of falls.                     [facility] Adult PT Treatment/Exercise - 10/09/15 0948    Transfers   Transfers Floor to Transfer  Floor to stand   Sit to Stand 6: Modified independent (Device/Increase time)  No UE support   Sit to Stand Details (indicate cue type and reason) Stands up quickly and needs cues to stand and get initial balance prior to initiating gait    Floor to Transfer 6: Modified independent (Device/Increase time)  2 reps-improvement noted in floor>stand transfer   Comments Practiced scooting transfer with chair up to table and away from table, 2 reps with minimal verbal cues for large amplitude movement  patterns   Ambulation/Gait   Ambulation/Gait Yes   Ambulation/Gait Assistance 5: Supervision;4: Min guard   Ambulation Distance (Feet) 530 Feet  then 565 ft in 3 minute walk; L foot/R foot catches 1x each   Assistive device None   Gait Pattern Step-through pattern;Decreased arm swing - right;Decreased arm swing - left;Decreased step length - right;Decreased dorsiflexion - right;Decreased trunk rotation;Narrow base of support;Poor foot clearance - right   Ambulation Surface Level;Indoor   Gait Comments Several episodes of foot catching with gait.  Pt able to maintain balance.        Self Care:  Discussed postural awareness, awareness of decreased foot clearance with gait.  Discussed limited recent visits to PT due to scheduling conflicts, pt's falls and therapist out due to death in family.  Discussed circumstances surrounding recent falls-pt feels falls are not "mechanical", but more blood pressure related.  Recommended patient follow up with Dr. Carles Collet regarding falls.  Discussed POC and continueing for several weeks to update HEP and work on stretching, postural strengthening and awareness and dynamic balance/gait.        PT Education - 10/09/15 1408    Education provided Yes   Education Details POC, progress towards goals, plans to renew/continue towards improved independence and safety.     Person(s) Educated Patient   Methods Explanation   Comprehension Verbalized understanding          PT Short Term Goals - 08/09/15 1334    PT SHORT TERM GOAL #1   Title Pt will perform HEP with family supervision for improved balance, transfers, and gait.  TARGET 08/11/15   Time 4   Period Weeks   Status Achieved   PT SHORT TERM GOAL #2   Title Pt will perform transfers from 18" surfaces and below with minimal use of hands, using proper and safe technique, 8 of 10 trials, for improved transfer safety.   Time 4   Period Weeks   Status Partially Met   PT SHORT TERM GOAL #3   Title Pt will  improve 4-square step test to less than 14 seconds with no loss of balance.   Baseline 11.56 sec 08/09/15   Time 4   Period Weeks   Status Achieved   PT SHORT TERM GOAL #4   Title Pt will verbalize understanding of fall prevention within the home environment.   Time 4   Period Weeks   Status Achieved           PT Long Term Goals - 10/09/15 0940    PT LONG TERM GOAL #1   Title Pt will improve Mini-Bestest score  to at least 22/28 for decreased fall risk.   Baseline 24/28 on 09/11/15   Time 8   Period Weeks   Status Achieved   PT LONG TERM GOAL #2   Title Pt will demonstrate proper, safe technique for pushing/pulling chairs away from table, modified independently, 4 of 5 trials.  EXTEND/GOAL ONGOING:  TARGET 10/12/15   Time 8   Period Weeks   Status Partially Met   PT LONG TERM GOAL #3   Title Pt will ambulate at least 500 ft with appropriate assistive device modified independently, without loss of balance.  GOAL ONGOING:  TARGET 10/12/15   Baseline 10/09/15:  2 episodes of R foot catching, but no loss of balance   Time 8   Period Weeks   Status Achieved   PT LONG TERM GOAL #4   Title Pt will perform floor>stand transfer with minimal UE support, modified independently, for safe fall recovery.  GOAL ONGOING 10/12/15   Status Achieved               Plan - 10/09/15 1313    Clinical Impression Statement Pt has not been seen consistenly during the month of April, which was to be additional 4 weeks in [place].  Pt has had several falls (pt feels they are blood pressure related versus mechanical falls).  Checked patient's therapy LTGs, and discussed with patient that based on continued fall risk per Functional Gait Assessment, and inconsistent therapy sessions over the past month (some due to therapist being out unplanned), pt would benefit from continued therapy for several weeks to address updated HEP, dynamic balance and posture work.   Rehab Potential Good   PT Frequency 2x / week   PT  Duration 4 weeks  per recert 02/15/32   PT Treatment/Interventions ADLs/Self Care Home Management;Therapeutic exercise;Therapeutic activities;Functional mobility training;Gait training;DME Instruction;Balance training;Neuromuscular re-education;Patient/family education   PT Next Visit Plan Follow up on back exercises; Complete PT renewal/recert next visit with additional LTGs-to address FGA, posture, hamstring stretches with updated HEP, 3 minute walk goals   Consulted and Agree with Plan of Care Patient      Patient will benefit from skilled therapeutic intervention in order to improve the following deficits and impairments:  Abnormal gait, Decreased balance, Decreased mobility, Decreased safety awareness, Decreased coordination, Difficulty walking, Impaired flexibility, Postural dysfunction  Visit Diagnosis: Other abnormalities of gait and mobility  Abnormal posture     Problem List Patient Active Problem List   Diagnosis Date Noted  . 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 Depression 11/22/2013  . 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 10/09/2015, 2:09 PM  Frazier Butt., PT  [facility] [address] [place], [place], [address] Phone: [phone]   Fax:  [phone]  Name: Elizabeth Mcintosh MRN: [id number] Date of Birth: 01-Jun-1946

## 2015-10-10 ENCOUNTER — Ambulatory Visit: Payer: 59

## 2015-10-10 ENCOUNTER — Ambulatory Visit: Payer: 59 | Admitting: Occupational Therapy

## 2015-10-16 ENCOUNTER — Ambulatory Visit: Payer: 59 | Admitting: Speech Pathology

## 2015-10-16 ENCOUNTER — Ambulatory Visit: Payer: 59 | Admitting: Occupational Therapy

## 2015-10-16 ENCOUNTER — Ambulatory Visit: Payer: 59 | Admitting: Physical Therapy

## 2015-10-16 DIAGNOSIS — R278 Other lack of coordination: Secondary | ICD-10-CM

## 2015-10-16 DIAGNOSIS — R29898 Other symptoms and signs involving the musculoskeletal system: Secondary | ICD-10-CM

## 2015-10-16 DIAGNOSIS — R2689 Other abnormalities of gait and mobility: Secondary | ICD-10-CM

## 2015-10-16 DIAGNOSIS — R29818 Other symptoms and signs involving the nervous system: Secondary | ICD-10-CM

## 2015-10-16 DIAGNOSIS — R293 Abnormal posture: Secondary | ICD-10-CM

## 2015-10-16 DIAGNOSIS — R471 Dysarthria and anarthria: Secondary | ICD-10-CM

## 2015-10-16 NOTE — Therapy (Signed)
[facility] [address] [place], [place], [address] Phone: [phone]   Fax:  [phone]  Occupational Therapy Treatment  Patient Details  Name: Elizabeth Mcintosh MRN: [id number] Date of Birth: [birth year] No Data Recorded  Encounter Date: 10/16/2015      OT End of Session - 10/16/15 1720    Visit Number 13   Number of Visits 18   Date for OT Re-Evaluation 11/23/15   Authorization Type UHC Medicare, no auth, no visit limit, G-code needed   Authorization - Visit Number 13   Authorization - Number of Visits 20   OT Start Time 1106  2 units, pt in BR   OT Stop Time 1145   OT Time Calculation (min) 39 min   Activity Tolerance Patient tolerated treatment well   Behavior During Therapy WFL for tasks assessed/performed      Past Medical History  Diagnosis Date  . Aneurysm ([place])     s/p clips  . Glaucoma   . Parkinson's disease ([place])   . Sleep apnea   . Hypotension     Past Surgical History  Procedure Laterality Date  . Craniotomy    . Aneurym clipping      There were no vitals filed for this visit.      Subjective Assessment - 10/16/15 1440    Subjective  Denies pain   Pertinent History Parkinsonism diagnosis 2015, MSA diagnosis 2016, hx of aneurysm clipping, glaucoma, orthostatic hypotension, hx of multiple falls   Patient Stated Goals improve coordination, ROM, ADLs   Currently in Pain? No/denies               Pt practiced donning TEDs with TED donner after therapist demonstration, min v.c. And increased time required. Pt was provided with information for purchase.  Flicking cards off the table with finger extension large amplitude movements, min v.c.                  OT Short Term Goals - 09/25/15 1150    OT SHORT TERM GOAL #1   Title Pt will be independent with updated HEP. (ck 10/17/15)   Time 3   Period Weeks   Status On-going  needs reinforcement, min v.c.   OT SHORT TERM  GOAL #2   Title Pt will write at least 3 sentences with 100% legibility and only min decr in size.    Time 3   Status On-going  min-mod decrease in letter size, 100% legibility   OT SHORT TERM GOAL #3   Title Pt will demo at least 125* R shoulder flex for functional reaching with -20* elbow extension or less.   Status Achieved  140 with -10 elbow extension   OT SHORT TERM GOAL #4   Title Pt will improve ability/ease with donning/doffing jacket as shown by improving time on PPT#4 by at least 8sec.   Time 4   Period Weeks   Status Achieved   OT SHORT TERM GOAL #5   Title Pt will perform PPT#2(simulated feeding) in 13  secs or less   Baseline 15.57 on 09/25/15   Time 3   Period Weeks   Status New           OT Long Term Goals - 09/25/15 1154    OT LONG TERM GOAL #1   Title Pt will verbalize understanding of updated AE/strategies to increase ease/independence with ADLs/IADLs prn.--ck 11/02/15   Time 6   Period Weeks   Status On-going  needs to practice with TED donning device, needs reinforement   OT LONG TERM GOAL #2   Title Pt will improve coordination/functional reaching for ADLs as shown by improving score on box and blocks test by at least 5 blocks bilaterally.   Baseline R-38 blocks, L-38 blocks   Time --   Period Weeks   Status Achieved  LUE 48, RUE 46 blocks   OT LONG TERM GOAL #3   Title Pt will demo at least 140* R shoulder flex for functional reaching with elbow ext WNL.   Baseline 125 on 09/25/15   Time 6   Period Weeks   Status On-going  08/14/15  met original goal and revised   OT LONG TERM GOAL #4   Title Pt will improve coordination for ADLs as shown by improving time on 9-hole peg test by at least 5 sec with RUE.   Baseline R-42.94sec   Time --   Period Weeks   Status Achieved  29.65 secs   OT LONG TERM GOAL #5   Title Pt will improve ability/ease with donning/doffing jacket as shown by improving time on PPT#4 in 30sec or less.   Baseline 55.41sec    Time --   Period Weeks   Status Achieved  09/25/15 12.60 secs   Long Term Additional Goals   Additional Long Term Goals Yes   OT LONG TERM GOAL #6   Title Pt will perform 3 button /unbutton in 35 secs or less    Baseline 42.75 secs   Time 6   Period Weeks   Status New               Plan - 10/16/15 1440    Clinical Impression Statement Pt is progressing towards goals.She reports that she disussed therapist recommendations regarding exercising extensors not flexors with trainer.   Rehab Potential Good   OT Frequency 2x / week   OT Duration 6 weeks   OT Treatment/Interventions Self-care/ADL training;Neuromuscular education;Therapist, nutritional;Therapeutic exercise   Plan adapted strategies for ADLs, feeding, handwriting   OT Home Exercise Plan Education issued:  PWR! hands HEP, Coordination HEP 08/07/15   Consulted and Agree with Plan of Care Patient      Patient will benefit from skilled therapeutic intervention in order to improve the following deficits and impairments:  Decreased coordination, Decreased activity tolerance, Impaired tone, Impaired UE functional use, Decreased mobility, Decreased balance, Decreased range of motion  Visit Diagnosis: Other lack of coordination  Other symptoms and signs involving the nervous system  Other symptoms and signs involving the musculoskeletal system    Problem List Patient Active Problem List   Diagnosis Date Noted  . 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 Depression 11/22/2013  . 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

## 2015-10-16 NOTE — Therapy (Addendum)
[facility] [address] [place], [place], [address] Phone: [phone]   Fax:  [phone]  Speech Language Pathology Treatment  Patient Details  Name: Elizabeth Mcintosh MRN: [id number] Date of Birth: 1946/03/15 Referring Provider: Dr. Wells Guiles Tat  Encounter Date: 10/16/2015      End of Session - 10/16/15 1104    Visit Number 15   Number of Visits 17   Date for SLP Re-Evaluation 09/06/15   SLP Start Time 1018   SLP Stop Time  1103   SLP Time Calculation (min) 45 min   Activity Tolerance Patient tolerated treatment well      Past Medical History  Diagnosis Date  . Aneurysm ([place])     s/p clips  . Glaucoma   . Parkinson's disease ([place])   . Sleep apnea   . Hypotension     Past Surgical History  Procedure Laterality Date  . Craniotomy    . Aneurym clipping      There were no vitals filed for this visit.      Subjective Assessment - 10/16/15 1022    Subjective "I just remembered I forgot to brush my hair" "My talking has been good"   Currently in Pain? No/denies               ADULT SLP TREATMENT - 10/16/15 1023    General Information   Behavior/Cognition Alert;Cooperative;Pleasant mood   Treatment Provided   Treatment provided Cognitive-Linquistic   Pain Assessment   Pain Assessment No/denies pain   Cognitive-Linquistic Treatment   Treatment focused on Dysarthria   Skilled Treatment Loud volume recalibrated with loud /a/ average of 88dB with rare min verbal and visual cues. Structured speech tasks with simple cogntive load 70dB with usual min to mod verbal cues. Simple conversation average 69dB with occasional min A for  loud volume.    Assessment / Recommendations / Plan   Plan Continue with current plan of care   Progression Toward Goals   Progression toward goals Progressing toward goals            SLP Short Term Goals - 10/16/15 1102    SLP SHORT TERM GOAL #1   Title Pt will  demonstrate loud /a/ of 85 dB over 3 sessions with rare min A   Status Achieved   SLP SHORT TERM GOAL #2   Title Pt will average 70dB during structured speech tasks with rare min A   Status Not Met   SLP SHORT TERM GOAL #3   Title Pt will average 70dB over 5 minute conversation with occasional min A   Status Partially Met          SLP Long Term Goals - 10/16/15 1105    SLP LONG TERM GOAL #1   Title Pt will maintain average of 69dB over 8 munute simple-mod complex conversation with occasional min A   Time 1   Period Weeks   Status Achieved   SLP LONG TERM GOAL #2   Title Pt will demo 100% intelligibility in 8 minute conversation in a mod-max noisy environment with rare min A   Time 2   Period Weeks   Status Revised   SLP LONG TERM GOAL #3   Title pt will maintain loud /a/ at average 85dB over 4 sessions   Time 1   Period Weeks   Status Achieved          Plan - 10/16/15 1105    Clinical Impression Statement  Pt requried min A to maintain loudness over conversations, and min cues for breath support to increase volume. Continue skilled ST to maximize carryover of loudness and intellgibility outside of treatment room in noisy environment. Pt would benefit from continued skilled 2x a week for 3 more weeks to address long term goal for intelligibility in variety of community settings.       Patient will benefit from skilled therapeutic intervention in order to improve the following deficits and impairments:   Dysarthria and anarthria    Problem List Patient Active Problem List   Diagnosis Date Noted  . 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 Depression 11/22/2013  . 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 [place], [place], [address] Phone: [phone]    Fax:  [phone]   Name: KAYANNA MCKILLOP MRN: [id number] Date of Birth: Mar 17, 1946

## 2015-10-17 ENCOUNTER — Ambulatory Visit: Payer: 59

## 2015-10-17 ENCOUNTER — Ambulatory Visit: Payer: 59 | Admitting: Occupational Therapy

## 2015-10-17 ENCOUNTER — Ambulatory Visit: Payer: 59 | Admitting: Physical Therapy

## 2015-10-17 DIAGNOSIS — R471 Dysarthria and anarthria: Secondary | ICD-10-CM

## 2015-10-17 DIAGNOSIS — R278 Other lack of coordination: Secondary | ICD-10-CM

## 2015-10-17 DIAGNOSIS — R2689 Other abnormalities of gait and mobility: Secondary | ICD-10-CM

## 2015-10-17 DIAGNOSIS — R29818 Other symptoms and signs involving the nervous system: Secondary | ICD-10-CM

## 2015-10-17 DIAGNOSIS — R293 Abnormal posture: Secondary | ICD-10-CM

## 2015-10-17 DIAGNOSIS — R29898 Other symptoms and signs involving the musculoskeletal system: Secondary | ICD-10-CM

## 2015-10-17 NOTE — Therapy (Signed)
[facility] [address] [place], [place], [address] Phone: [phone]   Fax:  [phone]  Occupational Therapy Treatment  Patient Details  Name: Elizabeth Mcintosh MRN: [id number] Date of Birth: 04/14/1946 No Data Recorded  Encounter Date: 10/17/2015      OT End of Session - 10/17/15 1007    Visit Number 14   Number of Visits 18   Date for OT Re-Evaluation 11/23/15   Authorization Time Period Pt is schedule through May 19   Authorization - Visit Number [id number]   Authorization - Number of Visits 20   OT Start Time [phone]   OT Stop Time 1015   OT Time Calculation (min) 39 min   Activity Tolerance Patient tolerated treatment well   Behavior During Therapy [facility] for tasks assessed/performed      Past Medical History  Diagnosis Date  . Aneurysm ([place])     s/p clips  . Glaucoma   . Parkinson's disease ([place])   . Sleep apnea   . Hypotension     Past Surgical History  Procedure Laterality Date  . Craniotomy    . Aneurym clipping      There were no vitals filed for this visit.      Subjective Assessment - 10/17/15 0957    Pertinent History Parkinsonism diagnosis 2015, MSA diagnosis 2016, hx of aneurysm clipping, glaucoma, orthostatic hypotension, hx of multiple falls   Patient Stated Goals improve coordination, ROM, ADLs   Currently in Pain? No/denies        PWR! Basic 4 in seated PWR! Up and step x 10, twist and rock x 20, min v.c. Reviewed adapted strategies for fastening buttons, cutting food and handwriting strategies with larger amplitude movements, min v.c.. Pt wrote a short paragraph with 100% legibility and min-mod decrease in letter size.                        OT Short Term Goals - 10/17/15 0954    OT SHORT TERM GOAL #1   Title Pt will be independent with updated HEP. (ck 10/17/15)   Time 3   Period Weeks   Status On-going  needs reinforcement, min v.c.   OT SHORT TERM GOAL #2   Title Pt will write at least 3 sentences with 100% legibility and only min decr in size.    Time 3   Status On-going  min-mod decrease in letter size, 100% legibility   OT SHORT TERM GOAL #3   Title Pt will demo at least 125* R shoulder flex for functional reaching with -20* elbow extension or less.   Status Achieved  140 with -10 elbow extension   OT SHORT TERM GOAL #4   Title Pt will improve ability/ease with donning/doffing jacket as shown by improving time on PPT#4 by at least 8sec.   Time 4   Period Weeks   Status Achieved   OT SHORT TERM GOAL #5   Title Pt will perform PPT#2(simulated feeding) in 13  secs or less   Baseline 15.57 on 09/25/15   Time 3   Period Weeks   Status On-going  18.32 secs with foam grip           OT Long Term Goals - 09/25/15 1154    OT LONG TERM GOAL #1   Title Pt will verbalize understanding of updated AE/strategies to increase ease/independence with ADLs/IADLs prn.--ck 11/02/15   Time 6   Period Weeks  Status On-going  needs to practice with TED donning device, needs reinforement   OT LONG TERM GOAL #2   Title Pt will improve coordination/functional reaching for ADLs as shown by improving score on box and blocks test by at least 5 blocks bilaterally.   Baseline R-38 blocks, L-38 blocks   Time --   Period Weeks   Status Achieved  LUE 48, RUE 46 blocks   OT LONG TERM GOAL #3   Title Pt will demo at least 140* R shoulder flex for functional reaching with elbow ext WNL.   Baseline 125 on 09/25/15   Time 6   Period Weeks   Status On-going  08/14/15  met original goal and revised   OT LONG TERM GOAL #4   Title Pt will improve coordination for ADLs as shown by improving time on 9-hole peg test by at least 5 sec with RUE.   Baseline R-42.94sec   Time --   Period Weeks   Status Achieved  29.65 secs   OT LONG TERM GOAL #5   Title Pt will improve ability/ease with donning/doffing jacket as shown by improving time on PPT#4 in 30sec or less.    Baseline 55.41sec   Time --   Period Weeks   Status Achieved  09/25/15 12.60 secs   Long Term Additional Goals   Additional Long Term Goals Yes   OT LONG TERM GOAL #6   Title Pt will perform 3 button /unbutton in 35 secs or less    Baseline 42.75 secs   Time 6   Period Weeks   Status New               Plan - 10/17/15 0959    Clinical Impression Statement Pt is progressing towards goals for adapted strategies for ADLS.   Rehab Potential Good   OT Frequency 2x / week   OT Duration 6 weeks   OT Treatment/Interventions Self-care/ADL training;Neuromuscular education;Therapist, nutritional;Therapeutic exercise   Plan continue to reinforce handwriting strategies, pt to bring in exercises for review   OT Home Exercise Plan Education issued:  PWR! hands HEP, Coordination HEP 08/07/15, red theraband for shoulder abduction, extension, rowing   Consulted and Agree with Plan of Care Patient      Patient will benefit from skilled therapeutic intervention in order to improve the following deficits and impairments:  Decreased coordination, Decreased activity tolerance, Impaired tone, Impaired UE functional use, Decreased mobility, Decreased balance, Decreased range of motion  Visit Diagnosis: Other lack of coordination  Other symptoms and signs involving the nervous system  Other symptoms and signs involving the musculoskeletal system    Problem List Patient Active Problem List   Diagnosis Date Noted  . 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 Depression 11/22/2013  . 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

## 2015-10-17 NOTE — Therapy (Signed)
[facility] [address] [place], [place], [address] Phone: [phone]   Fax:  [phone]  Physical Therapy Treatment  Patient Details  Name: Elizabeth Mcintosh MRN: [id number] Date of Birth: 25-Feb-1946 Referring Provider: Wells Guiles Tat  Encounter Date: 10/16/2015      PT End of Session - 10/17/15 0958    Visit Number 14   Number of Visits 24   Date for PT Re-Evaluation 46/65/99  per recert dated 08/11/68   Authorization Type UHC Medicare-Gcode every 10th visit   PT Start Time 1147   PT Stop Time 1229   PT Time Calculation (min) 42 min   Equipment Utilized During Treatment Gait belt   Activity Tolerance Patient tolerated treatment well   Behavior During Therapy [facility] for tasks assessed/performed      Past Medical History  Diagnosis Date  . Aneurysm ([place])     s/p clips  . Glaucoma   . Parkinson's disease ([place]   . Sleep apnea   . Hypotension     Past Surgical History  Procedure Laterality Date  . Craniotomy    . Aneurym clipping      There were no vitals filed for this visit.      Subjective Assessment - 10/16/15 1152    Subjective Had another "shaky" episode yesterday.  Plan to see Dr. Carles Collet next week.   Patient Stated Goals Pt's goal for therapy is to get a little more steady with walking.   Currently in Pain? Yes   Pain Score 2    Pain Location Back   Pain Orientation Lower   Pain Descriptors / Indicators Aching;Sore   Pain Onset More than a month ago   Pain Frequency Intermittent   Aggravating Factors  lying flat aggravates pain   Pain Relieving Factors pillow on wedge helps                         [facility] Adult PT Treatment/Exercise - 10/17/15 0001    Transfers   Comments Standing for funcitonal strenthening upon sit>stand:  cues for quad activation, glut activation and scapular squeezes, x 10 reps 3 second hold.   Exercises   Exercises Other Exercises   Other Exercises  Standing  gastroc stretch with one foot propped at 2" block, 3 reps x 30 seconds each   Lumbar Exercises: Stretches   Active Hamstring Stretch 30 seconds;3 reps  used sheet for stretch   Active Hamstring Stretch Limitations Performed sitting hamstring stretch edge of chair, with cues for posture and technique   Single Knee to Chest Stretch 3 reps;30 seconds   Double Knee to Chest Stretch 2 reps;20 seconds   Lower Trunk Rotation 5 reps;10 seconds   Pelvic Tilt Other (comment)  10 reps seated, 10 reps supin (modified with wedge)   Lumbar Exercises: Standing   Other Standing Lumbar Exercises Posture/pec stretch at corner, 3 reps x 30 seconds   Lumbar Exercises: Supine   Other Supine Lumbar Exercises Bridging x 10 with 3 second hold     Review of HEP provided at 09/25/15 visit-see instructions that visit date.  Pt return demo understanding of HEP.             PT Short Term Goals - 08/09/15 1334    PT SHORT TERM GOAL #1   Title Pt will perform HEP with family supervision for improved balance, transfers, and gait.  TARGET 08/11/15   Time 4   Period Weeks  Status Achieved   PT SHORT TERM GOAL #2   Title Pt will perform transfers from 18" surfaces and below with minimal use of hands, using proper and safe technique, 8 of 10 trials, for improved transfer safety.   Time 4   Period Weeks   Status Partially Met   PT SHORT TERM GOAL #3   Title Pt will improve 4-square step test to less than 14 seconds with no loss of balance.   Baseline 11.56 sec 08/09/15   Time 4   Period Weeks   Status Achieved   PT SHORT TERM GOAL #4   Title Pt will verbalize understanding of fall prevention within the home environment.   Time 4   Period Weeks   Status Achieved           PT Long Term Goals - 10/17/15 1001    PT LONG TERM GOAL #1   Title Pt will demonstrate understanding of progressive, updated HEP for improved posture, balance, gait.  TARGET 11/15/15   Time 4   Period Weeks   Status New   PT LONG  TERM GOAL #2   Title Pt will improve gait distance in 3 minute walk by at least 50 ft for improved efficiency with gait.  TARGET 11/15/15   Baseline 565 ft in 3 minute walk when assessed 10/09/15   Time 4   Period Weeks   Status New   PT LONG TERM GOAL #3   Title Pt will improve Functional Gait Assessment to at least 24/30 for decreased fall risk.  TARGET 11/15/15   Baseline 10/09/15:  2 episodes of R foot catching, but no loss of balance   Time 4   Period Weeks   Status New      New LTGs set this visit.         Plan - 10/17/15 0958    Clinical Impression Statement Overall, pt demo good understanding of low back stretches performed in (modified wedge) supine position and sitting position, which were added to HEP several sessions ago.  Pt was not seen consistently during month of April due to therapist out of clinic and scheduling conflicts.  Pt has met nearly all previous LTGs but would continue to benefit from additional dynamic balance, gait and stretching instruction and practice.  Renewal completed this visit.  Plan to continue to work on posture, balance, and gait for improved funcitonal mobility.   Rehab Potential Good   PT Frequency 2x / week   PT Duration 4 weeks  per recert 10/16/14   PT Treatment/Interventions ADLs/Self Care Home Management;Therapeutic exercise;Therapeutic activities;Functional mobility training;Gait training;DME Instruction;Balance training;Neuromuscular re-education;Patient/family education   PT Next Visit Plan HEP additions-Hamstring, gastroc, pec stretches; work on Ambulance person and Agree with Plan of Care Patient      Patient will benefit from skilled therapeutic intervention in order to improve the following deficits and impairments:  Abnormal gait, Decreased balance, Decreased mobility, Decreased safety awareness, Decreased coordination, Difficulty walking, Impaired flexibility, Postural dysfunction  Visit Diagnosis: Other abnormalities of gait  and mobility  Abnormal posture     Problem List Patient Active Problem List   Diagnosis Date Noted  . 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 Depression 11/22/2013  . 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 10/17/2015, 10:07 AM  Frazier Butt., PT [facility] [address] [place], [place], [address] Phone: [phone]  Fax:  [phone]  Name: KINZLEE SELVY MRN: [id number] Date of Birth: Sep 03, 1945

## 2015-10-17 NOTE — Therapy (Signed)
[facility] [address] [place], [place], [address] Phone: [phone]   Fax:  [phone]  Speech Language Pathology Treatment  Patient Details  Name: Elizabeth Mcintosh MRN: [id number] Date of Birth: [birth year] Referring Provider: Dr. Wells Guiles Tat  Encounter Date: 10/17/2015      End of Session - 10/17/15 1643    Visit Number 16   Number of Visits 17   Date for SLP Re-Evaluation 10/12/15   SLP Start Time 81   SLP Stop Time  1145   SLP Time Calculation (min) 43 min   Activity Tolerance Patient tolerated treatment well      Past Medical History  Diagnosis Date  . Aneurysm ([place])     s/p clips  . Glaucoma   . Parkinson's disease ([place])   . Sleep apnea   . Hypotension     Past Surgical History  Procedure Laterality Date  . Craniotomy    . Aneurym clipping      There were no vitals filed for this visit.      Subjective Assessment - 10/17/15 1110    Subjective "I was at a bar the other night for over an hour and people understood me the whole time."   Currently in Pain? No/denies               ADULT SLP TREATMENT - 10/17/15 1111    General Information   Behavior/Cognition Alert;Cooperative;Pleasant mood   Treatment Provided   Treatment provided Cognitive-Linquistic   Cognitive-Linquistic Treatment   Treatment focused on Dysarthria   Skilled Treatment conversational loudness recalibrated with loud /a/ with average of 90dB with initial cues to open mouth more with loud /a/.  In semi-strucutred multisentence tasks pt maintained volume of 70dB with occasional min A. Simple conversation was maintained with average 70dB and rare min A for first 10 minutes and occasional min cues for next 3 minutes. In conversation tasks with a higher cognitive load pt req'd occasional min-mod A to maintain 68dB. Pt agrees that next session will be sufficient and d/c is appropriate.   Assessment / Recommendations / Plan   Plan Continue with current plan of care   Progression Toward Goals   Progression toward goals Progressing toward goals            SLP Short Term Goals - 10/16/15 1102    SLP SHORT TERM GOAL #1   Title Pt will demonstrate loud /a/ of 85 dB over 3 sessions with rare min A   Status Achieved   SLP SHORT TERM GOAL #2   Title Pt will average 70dB during structured speech tasks with rare min A   Status Not Met   SLP SHORT TERM GOAL #3   Title Pt will average 70dB over 5 minute conversation with occasional min A   Status Partially Met          SLP Long Term Goals - 10/17/15 1645    SLP LONG TERM GOAL #1   Title Pt will maintain average of 69dB over 8 munute simple-mod complex conversation with occasional min A   Time 1   Period Weeks   Status Achieved   SLP LONG TERM GOAL #2   Title Pt will demo 100% intelligibility in 8 minute conversation in a mod-max noisy environment with rare min A   Time 1   Period Weeks   Status Revised   SLP LONG TERM GOAL #3   Title pt will maintain loud /a/ at average  85dB over 4 sessions   Time 1   Period Weeks   Status Achieved          Plan - 10/17/15 1644    Clinical Impression Statement Pt requried SLP A to maintain loudness over conversations, and min cues for keeping "frog voice" from appearing. Continue skilled ST to maximize carryover of loudness and intellgibility. Next session will be pt's last session.   Speech Therapy Frequency 2x / week   Duration 4 weeks   Treatment/Interventions Compensatory techniques;Internal/external aids;SLP instruction and feedback;Functional tasks;Patient/family education   Potential to Achieve Goals Good   Potential Considerations Severity of impairments   Consulted and Agree with Plan of Care Patient      Patient will benefit from skilled therapeutic intervention in order to improve the following deficits and impairments:   Dysarthria and anarthria    Problem List Patient Active Problem List    Diagnosis Date Noted  . 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 Depression 11/22/2013  . 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

## 2015-10-17 NOTE — Patient Instructions (Signed)
Gastroc / Heel Cord Stretch - On Step    Stand with heels over edge of stair or propped on a 2-4 inch block. Holding rail or counter, lower heels until stretch is felt in calf of legs.  When you are standing at the block, you will make sure to keep heel on ground, knee straight, and lean forward through your hips. Repeat __3_ times. Hold 30 seconds, each leg.  Do _1-2__ times per day.  Copyright  VHI. All rights reserved.  Flexibility: Corner Stretch    Standing in corner with hands just above shoulder level and feet _12___ inches from corner, lean forward until a comfortable stretch is felt across chest. Hold _15-30___ seconds. Repeat __3__ times per set. Do __1-2__ sessions per day.  http://orth.exer.us/343   Copyright  VHI. All rights reserved.

## 2015-10-18 NOTE — Therapy (Signed)
[facility] [address] [place], [place], [address] Phone: [phone]   Fax:  [phone]  Physical Therapy Treatment  Patient Details  Name: MAKINZI PRIEUR MRN: [id number] Date of Birth: [birth year] Referring Provider: Wells Guiles Tat  Encounter Date: 10/17/2015      PT End of Session - 10/18/15 1206    Visit Number 15   Number of Visits 24   Date for PT Re-Evaluation 33/29/51  per recert 01/15/40   Authorization Type UHC Medicare-Gcode every 10th visit   PT Start Time 1018   PT Stop Time 1102   PT Time Calculation (min) 44 min   Equipment Utilized During Treatment Gait belt   Activity Tolerance Patient tolerated treatment well   Behavior During Therapy [facility] for tasks assessed/performed      Past Medical History  Diagnosis Date  . Aneurysm ([place])     s/p clips  . Glaucoma   . Parkinson's disease ([place])   . Sleep apnea   . Hypotension     Past Surgical History  Procedure Laterality Date  . Craniotomy    . Aneurym clipping      There were no vitals filed for this visit.      Subjective Assessment - 10/17/15 1019    Subjective No pain today, was just hear yesterday.   Patient Stated Goals Pt's goal for therapy is to get a little more steady with walking.   Currently in Pain? No/denies                         [facility] Adult PT Treatment/Exercise - 10/17/15 1020    Transfers   Sit to Stand 6: Modified independent (Device/Increase time);With upper extremity assist;Without upper extremity assist;From chair/3-in-1   Floor to Transfer 6: Modified independent (Device/Increase time);With upper extremity assist;Without upper extremity assist;From chair/3-in-1   Transfer Cueing Cues for quad and glut activation for best upright posture in standing, 2 sets x 10 reps   High Level Balance   High Level Balance Activities Side stepping;Backward walking;Marching forwards;Figure 8 turns;Marching  turns;Weight-shifting turns;Other (comment)  Forward walk:  all at counter 3 reps x 25 ft UE support   High Level Balance Comments Practiced figure-8 turns 3 reps, then clockwise/counterclockwise quarter turns, x 2 reps each   Exercises   Exercises Other Exercises   Other Exercises  Standing gastroc stretch with one foot propped at 2" block, 3 reps x 30 seconds each  Corner pec stretch 3 x 15 seconds   Lumbar Exercises: Stretches   Active Hamstring Stretch 3 reps;30 seconds  Seating position   Lumbar Exercises: Aerobic   Stationary Bike Foot pedaler 5 minutes, moderate resistance  Forward direction pedaling      Forward step ups x 10 reps at 6" step for improved functional lower extremity/quad strengthening.  Practiced and reviewed turning strategies for improved balance with turns:  Figure-8 turns x 3 reps, then clockwise and counterclockwise turns at counter.  Pt requires cues for pacing and safety of movement.     Discussed importance of consistent stretching for lower extremities and posture.      PT Education - 10/18/15 1206    Education provided Yes   Education Details HEP additions for stretching-see instructions   Person(s) Educated Patient   Methods Explanation;Demonstration;Handout   Comprehension Verbalized understanding;Returned demonstration          PT Short Term Goals - 08/09/15 1334    PT SHORT TERM GOAL #  1   Title Pt will perform HEP with family supervision for improved balance, transfers, and gait.  TARGET 08/11/15   Time 4   Period Weeks   Status Achieved   PT SHORT TERM GOAL #2   Title Pt will perform transfers from 18" surfaces and below with minimal use of hands, using proper and safe technique, 8 of 10 trials, for improved transfer safety.   Time 4   Period Weeks   Status Partially Met   PT SHORT TERM GOAL #3   Title Pt will improve 4-square step test to less than 14 seconds with no loss of balance.   Baseline 11.56 sec 08/09/15   Time 4   Period  Weeks   Status Achieved   PT SHORT TERM GOAL #4   Title Pt will verbalize understanding of fall prevention within the home environment.   Time 4   Period Weeks   Status Achieved           PT Long Term Goals - 10/17/15 1001    PT LONG TERM GOAL #1   Title Pt will demonstrate understanding of progressive, updated HEP for improved posture, balance, gait.  TARGET 11/15/15   Time 4   Period Weeks   Status New   PT LONG TERM GOAL #2   Title Pt will improve gait distance in 3 minute walk by at least 50 ft for improved efficiency with gait.  TARGET 11/15/15   Baseline 565 ft in 3 minute walk when assessed 10/09/15   Time 4   Period Weeks   Status New   PT LONG TERM GOAL #3   Title Pt will improve Functional Gait Assessment to at least 24/30 for decreased fall risk.  TARGET 11/15/15   Baseline 10/09/15:  2 episodes of R foot catching, but no loss of balance   Time 4   Period Weeks   Status New               Plan - 10/18/15 1207    Clinical Impression Statement Additions for stretching added to HEP for gastroc and pec stretches.  With cues for activation of quads, pt is able to achieve terminal knee extension with standing activities.  Pt will continue to benefit from further skilled PT to address balance, posture, gait.   Rehab Potential Good   PT Frequency 2x / week   PT Duration 4 weeks  per recert 01/13/56   PT Treatment/Interventions ADLs/Self Care Home Management;Therapeutic exercise;Therapeutic activities;Functional mobility training;Gait training;DME Instruction;Balance training;Neuromuscular re-education;Patient/family education   PT Next Visit Plan Check HEP additions for stretching; discuss pt's summer plans (safety within home when husband is out of country); intensity of movement/mobiltiy activities   Consulted and Agree with Plan of Care Patient      Patient will benefit from skilled therapeutic intervention in order to improve the following deficits and impairments:   Abnormal gait, Decreased balance, Decreased mobility, Decreased safety awareness, Decreased coordination, Difficulty walking, Impaired flexibility, Postural dysfunction  Visit Diagnosis: Other abnormalities of gait and mobility  Abnormal posture     Problem List Patient Active Problem List   Diagnosis Date Noted  . 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 Depression 11/22/2013  . 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 10/18/2015, 12:14 PM  Frazier Butt., PT [facility] [address] [place], [place], [address] Phone: [phone]   Fax:  [phone]  Name:  KALENNA MILLETT MRN: [id number] Date of Birth: 30-Aug-1945

## 2015-10-19 ENCOUNTER — Ambulatory Visit: Payer: Medicare Other | Admitting: Neurology

## 2015-10-23 ENCOUNTER — Ambulatory Visit: Payer: 59

## 2015-10-23 ENCOUNTER — Telehealth: Payer: Self-pay | Admitting: Neurology

## 2015-10-23 ENCOUNTER — Ambulatory Visit: Payer: 59 | Admitting: Occupational Therapy

## 2015-10-23 ENCOUNTER — Ambulatory Visit: Payer: 59 | Admitting: Physical Therapy

## 2015-10-23 NOTE — Telephone Encounter (Signed)
Elizabeth Mcintosh  [phone] she is Elizabeth AngelicaMary Mcintosh's Daughter. She has some questions for you regarding her moms appointment tomorrow. She would like to talk with you today please. Thank you

## 2015-10-23 NOTE — Telephone Encounter (Signed)
Spoke with Abby, patient's daughter, she wanted to let us know about her mother. They went on a trip this weekend for a family reunion. She had several episodes where her blood pressure dropped and she goes limp (she has these episodes often). One time her left arm started shaking, she describes heavy breathing (rasping, gurgling sound) and it took longer to recover than usual. One other time she lost the use of her right arm and it took 30 minutes before she could use it again. She wanted to make us aware of these episodes so they could be discussed tomorrow. She will note be able to come to appt.

## 2015-10-23 NOTE — Telephone Encounter (Signed)
Documented in other encounter

## 2015-10-23 NOTE — Telephone Encounter (Signed)
PT's daughter Deloria Lairbby called and wanted to talk to Dr Tat about some new things she noticed over the weekend and wanted to discuss it before her mom's appointment tomorrow/Dawn CB# [phone]

## 2015-10-23 NOTE — Addendum Note (Signed)
Addended by: Clinton SawyerLOVVORN, Deyjah Kindel A on: 10/23/2015 01:27 PM   Modules accepted: Orders

## 2015-10-24 ENCOUNTER — Encounter: Payer: Self-pay | Admitting: Neurology

## 2015-10-24 ENCOUNTER — Telehealth: Payer: Self-pay | Admitting: Internal Medicine

## 2015-10-24 ENCOUNTER — Ambulatory Visit (INDEPENDENT_AMBULATORY_CARE_PROVIDER_SITE_OTHER): Payer: Medicare Other | Admitting: Neurology

## 2015-10-24 VITALS — BP 110/70 | HR 92 | Ht 68.5 in | Wt 145.0 lb

## 2015-10-24 DIAGNOSIS — G232 Striatonigral degeneration: Secondary | ICD-10-CM | POA: Diagnosis not present

## 2015-10-24 DIAGNOSIS — I951 Orthostatic hypotension: Secondary | ICD-10-CM

## 2015-10-24 DIAGNOSIS — R55 Syncope and collapse: Secondary | ICD-10-CM

## 2015-10-24 MED ORDER — AMBULATORY NON FORMULARY MEDICATION: 1.0000 | Freq: Every day | Status: DC

## 2015-10-24 NOTE — Telephone Encounter (Signed)
Patient saw Dr. Arbutus Leasat today. Dr. Arbutus Leasat was supposed to send a message to Dr. Rennis GoldenHilty about increasing fludrocortisone. She has had one episode of passing out related to low BP per Dr. Arbutus Leasat  Informed patient that I will have to send the message to Dr. Rennis GoldenHilty to advise on.  Informed patient that I am out of the office 5/18 to 5/23 and another clinical team member would call her back with MD advice.

## 2015-10-24 NOTE — Telephone Encounter (Signed)
I certainly can talk to patient about this, but we have worked up these episodes extensively in the past and we believe that they are related to BP drops.  She just saw Dr. Rennis GoldenHilty and don't think that they discussed these and he is one taking care of BP issues and prescribing those medications.  Please let daughter know above and I can talk with patient when I see her

## 2015-10-24 NOTE — Telephone Encounter (Signed)
Patients daughter made aware

## 2015-10-24 NOTE — Progress Notes (Signed)
Elizabeth Mcintosh was seen today in the movement disorders clinic for neurologic consultation at the request of [name],[name], MD.  The consultation is for the evaluation of abnormal gait and "awkwardness" and weakness on the R side.  Pt states that they were away in [place] and [place] in October, 2014 and she felt like she just didn't bounce back after that.  She just felt "slow" after.  In Jan, she went to ortho to get an injection in her shoulder b/c of an impingement syndrome but still felt slow after PT, although she did good in PT.  She tried to d/c all the meds that she thought that she thought would interfere and could cause slowness - benadryl and unisom.  She feels that her right arm doesn't swing.  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  11/22/13 update:  Pt returns today for f/u, accompanied by her daughter who supplements the history.  The patient was diagnosed with Parkinson's disease last visit.  We started levodopa.  The patient reports that she is moving easier.  Her friends noticed that she is more alert.  She did her physical therapies and is planning on starting a cardiovascular exercise program, but has not figured out how exactly to do that or what to do.  She and her daughter bring a long list of questions.  Her daughter asks me about recognition of depression.  The patient denies this, but her daughter states that she does not think that the patient would be able to recognize it, and her daughter thinks that there is some depression.  The patient is very active, especially in regards to travel.  She will be traveling to [place] in July and then will be traveling again overseas in October.  She asks me about refilling her Ambien for insomnia.  Her daughter asks me about drinking alcohol.  Last visit, the patient reported that she drank about 3 or 4 glasses of wine per night, each totaling 6-8 ounces.  No falls.  No hallucinations.  No lightheadedness.    02/22/14  update:  Pt accompanied by her son, who supplements the history.  Pt saw Dr. Conley Canal since last visit.  No evidence of dementia.  Believed had adjustment d/o related to dx of PD and pt agreed to go to counseling with Dr. Conley Canal.  Dr. Conley Canal also discussed with her tapering alcohol use, as I have also.  Pt states that she was told that she could still have 3 drinks per night.   She has increased her carbidopa/levodopa 25/100 to 2 in the AM, 1 in the afternoon, 1 in the evening.  Pt states that she has more energy after going up in the AM.  Her BP was really low today, and she states that it "is always low."  She is sometimes dizzy but it is not bad.  She is getting up in the middle of the night frequently to use the bathroom and cannot sleep.  She wonders if the klonopin contributes to that.  She is exercising more; went 5 days last week.    06/27/14 update:  The patient is following up today.  She is accompanied by her husband who supplements the history.  She is currently on carbidopa/levodopa 25/100, 2 tablets in the morning, one in the afternoon and one in the evening.  Pramipexole was started last visit and worked up to 0.5 g 3 times a day.  She stopped  this and ended up restarting it in November.  She has not noticed a huge difference with this medication.  She did call me in November with an episode of vision change where everything went bright.  It lasted for about 7-10 minutes and she felt somewhat dizzy.  She is not able to have an MRI because of prior aneurysm clips.  A CT of the brain was done and did not reveal any changes.  A carotid ultrasound demonstrated less than 50% stenosis bilaterally.  She was just evaluated for PT/OT/ST on 06/08/2014.  She has been doing a lot of travel - [place], [place], [place] and she did well with a lot of walking.  Her husband states that she did pretty well.  She is working out with a Systems analyst with at [employer].  No falls but feels that she is "lurching"  forward.  Mood is good.  Trouble sleeping.  States that Dr. Timothy Lasso increased her klonopin to 1.5 tablets at night.  Still drinking 2 glasses wine per night but that is down from previously.  10/26/14 update:  The patient is following up today, accompanied by her daughter who supplements the history.  She is on carbidopa/levodopa 25/100, 2 in the morning, one in the afternoon and one in the evening along with pramipexole, 0.5 mg 3 times per day.  She states that she misses one lunchtime pill a week because she forgets. Pt states that she fell 2 days ago.   She was quickly walking into the kitchen and fell into the drying rack in front of the washer and she fell into her shoulder.  No LOC.  Her husband helped her get back up.  Her daughter thinks that she had another fall not long after our last visit; she stepped outside on the ice and went out to get the paper and fell.  She has just finished PT/OT/ST and she states that she learned strategies and she learned exercises.  She has a Systems analyst at the [facility] that she sees once per week and she goes 3 other times throughout the week.  She is on a combination of clonazepam and 3 mg of melatonin for insomnia.  She states that is working well.  She states that her PCP started myrbetriq since last visit and it has helped her bladder.  She does state that she is having cramping of the feet and legs about 3 nights a week.  She continues to drink a glass of wine at lunchtime and 1 "small" glass of liquor in the evening.  She has developed some diplopia and is getting new glasses.  She states that she will not have prisms in the glasses.  12/28/14 update:  The patient is following up today.  She is accompanied by her son and daughter who supplement the history.   She is on carbidopa/levodopa 25/100, 2 tablets in the morning, one in the afternoon and one in the evening in addition to pramipexole, 0.5 mg 3 times per day.  Last visit, I also added carbidopa/levodopa 50/200 at  night because she was complaining about toe curling.  This really helped that problem.   Since our last visit, however, there have been multiple calls from the patient as well as from one of her physician friends complaining about "spells."  These spells will consist of having fragmented vision as if she is looking through a prism and then she feels like she is moving slowly.  She had a CTA of the  head and neck because of these.  It was essentially unremarkable.  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  There was no evidence of recurrent aneurysm.  Her physician friend called me on 11/07/2014 to tell me that he had witnessed these episodes.  The patient complained about bright flashing lights before the episode and then she seemed a little rigid and sunk to the ground.  He was not sure if there was loss of consciousness as she just sunk to the ground, but he did note that when he spoke to her on the ground, she was completely awake and aware.  She had an EEG that did not demonstrate any seizures and then had an ambulatory EEG that just demonstrated temporal delta, without epileptiform activity.  She does have a cardiac consult pending on 01/04/2015.   Her daughter states that a "spell" seems to start with a swaying and then the body seems to be unable to support her.  She seems slow to communicate but is able.  It seems unrelated to dosing timing.  After an episode she seems to need to be in bed all day.  There may be some lightheadedness.  If she sees a prism of light, it lasts a few min and doesn't occur before but during an episode.  Today, they say there is never LOC.  Her face seems white during an episode.  An episode never happens while seated.  An episode is happening in clusters.  A week ago, she had 3 in a day and then this past week she had 1 episode.    Besides for the episodes, her daughter states that they have seen a steep physical decline.  They have seen more  shuffling.  The right leg feels like a "peg leg."   Daughter relates that in June she fell hard on concrete and hit her head and had a very large knot on her posterior occiput and refused to go and get it evaluated.   She is still exercising but hasn't been the last 2 weeks because she hasn't been feeling as well.  She is drinking a glass of wine a day.  She is still on klonopin, 1.5 tablets at night (increased in Jan by Dr. Timothy Lasso).  02/28/15 update:  The patient has a history of multiple system atrophy.  She is accompanied by her husband who supplements the history.    She is on carbidopa/levodopa 25/100, 2 tablets in the morning, one the afternoon and one in the evening as well as pramipexole 0.5 mg 3 times per day.  She had a CT of the brain last visit that was negative.  Last visit, I felt that she was likely having "spells" because of orthostatic hypotension although she was not orthostatic in my office.  I did give her a prescription for an abdominal binder as well as compression stockings.  She is wearing the binder today but couldn't get on the stockings.  She ultimately saw Dr. Rennis Golden and I reviewed his records and am appreciative of his input.  The patient was orthostatic in his office so he felt that a tilt table was not indicated.  He did start her on Northera.  She is now on 600 mg tid.  She ended up in the emergency room on 01/31/2015 at [facility] after a fall.  She did not have a "spell" but she just missed a step.  She missed a step and fell backwards into her  head.  She had another CT of the brain done and there was no evidence of intracerebral bleeding or hemorrhage although there was a moderate sized posterior scalp contusion.  A CT of the spine was done as well and was negative.  They placed staples in her head because of the scalp contusion.  Her blood pressure was apparently significantly elevated initially but it was much lower upon discharge.  She has been taking her BP regularly at home and  it has been low sometimes but not too high.  She has had a few other minor falls.  She is still going to the gym with a trainer 2 times a week and is going two other times a week.  No choking on foods.  Admits to intermittent diplopia with reading.    03/30/15 update:  The patient is following up today, accompanied by her husband who supplements the history.  I have spoken to her primary care physician over the telephone about her as well.  She is on carbidopa/levodopa 25/100, 2 tablets in the morning, one in the afternoon and one in the evening.  She is on pramipexole 0.5 mg, one tablet tid.  I asked her to hold her carbidopa/levodopa 50/200 at bedtime last visit but she didn't do that and is still on that.  Unfortunately, since our last visit she has had multiple falls (states more than she can count) and then a syncopal episode on October 13.  She had a CT of the brain after fall in October 6 that was nonacute.  She had a normal follow-up with her primary care physician on October 13 and her blood pressure was very low with systolic in the 60s.  She was told to go home and hydrate, but unfortunately she ended up going home and having a syncopal episode.  In the emergency room, she was given fluids and her blood pressures were actually quite high, as high as 181/80.  No low blood pressures were recorded in the emergency room as far as I can tell.  She was told by her primary care physician, the emergency room and myself to discontinue alcohol.  States that she hasn't had a drink since that ER visit.  She remains on Northera, 600 mg 3 times per day.  States that she was started on florinef earlier this week and husband feels that she is much better.  States that she was having fainting spells 2 times a day and none since starting the meds (not all sound like syncope and some perhaps near syncope).  Her PCP reports that pt decided on DNR and LOC orders filled out with him.  Husband just bought her a walker at  [employer] and he asks me about WC.  Family would [facility]  06/21/15 update:  Patient follows up today, accompanied by her husband who supplements history.  I have reviewed prior records made available to me.  The patient is on carbidopa/levodopa 25/100, 2 tablets in the morning, one in the afternoon, one in the evening and carbidopa/levodopa 50/200 at night.  I asked her decrease her pramipexole last visit from 0.5 mg 3 times a day to 0.25 mg 3 times a day because of orthostasis and syncope.  She is not sure if she did that or not.   She continues on northera, 600 mg 3 times per day for this along with florinef 0.1 mg, 1-1/2 tablets daily.  She has done much better with this, and has not had  further syncope.  She did go to the emergency room on 05/06/2015 after a mechanical fall on her stairs.  A ct of the brain was negative.  She has not fallen since then.  She has had some near falls.  She has a PT re-eval upcoming in Feb.   She did ask me last visit for a second opinion at [facility] and has an appointment on 07/09/2015 with a general neurologist at [facility].  Her husband states that a friend of theirs sees a PD specialist at [facility] and has had DBS and thinks that this is the best place to go.  Asks me why I think that she has MSA and not PD.  10/24/15 update:  Patient follows up today, accompanied by her husband who supplements history.  I have reviewed prior records made available to me.  The patient is on carbidopa/levodopa 25/100, 2 tablets in the morning, one in the afternoon, one in the evening and carbidopa/levodopa 50/200 at night.  She is on pramipexole 0.25 mg 3 times a day, which is a lower dosage because of orthostasis and syncope.  She continues on northera, 600 mg 3 times per day for this along with florinef 0.1 mg, 1-1/2 tablets daily.  She has done much better with this, in addition to medications.  However, her daughter called yesterday and stated that the patient had several episodes  this past weekend where her BP would drop and she would "go limp" (no LOC per pt) and one time her L arm shook and one time the R arm shook.  These have been worked up extensively in the past and determined all likely due to BP drops and significant orthostasis. Pt does describe that on Friday, she was sitting at dinner and looked up and she saw lights.  She then had head heaviness and she felt that her body would "curl up" and her family thought that it was her BP lowering so they had her "march in place" in her seat.  She then went back to the hotel without further incident.  The following day, she sat down and then stood up and fell.  She couldn't get up and states that she think that she passed out.  States that sometimes when she has a "sinking spell" her arms and legs will shake, but she is awake when that happens.  It is both arms and legs according to pt/husband.  Her husband states that "if I get her feet up in the recliner, she never has the spells."   Her husband is going to [place] for 3 months but they have hired a caregiver during the day and her son is coming at night.   She did see Dr. Dominga Ferry at [facility] on January 30.  I reviewed his records but he really said nothing new other than to refer her to the movement disorder clinic at [facility].  She is going to see Dr. Fidela Juneau on 07/03/15.  She asked me again today about alcohol.  She is drinking daily, one glass of wine.  Her husband states that it depends on who is pouring it that will determine the size of that class.  The patient insists that is very small.  PREVIOUS MEDICATIONS: none to date  ALLERGIES:   Allergies  Allergen Reactions  . Penicillins     *childhood allergy*    CURRENT MEDICATIONS:  Current Outpatient Prescriptions on File Prior to Visit  Medication Sig Dispense Refill  . bimatoprost (LUMIGAN) 0.01 % SOLN Place 1  drop into both eyes at bedtime.    . carbidopa-levodopa (SINEMET CR) 50-200 MG tablet TAKE 1 TABLET BY MOUTH AT BEDTIME  30 tablet 5  . carbidopa-levodopa (SINEMET IR) 25-100 MG per tablet TAKE 2 TABLETS IN THE AM, 1 TABLET AT NOON, 1 IN THE EVENING 120 tablet 5  . Droxidopa 300 MG CAPS Take 2 tablets three times a day 168 capsule 10  . fludrocortisone (FLORINEF) 0.1 MG tablet Take 0.1 mg by mouth daily. Take 1 & 1/2 tablets daily.  5  . LYSINE PO Take by mouth.    . Melatonin 5 MG CAPS Take by mouth at bedtime.    Marland Kitchen MYRBETRIQ 25 MG TB24 tablet Take 25 mg by mouth daily.     . pramipexole (MIRAPEX) 0.25 MG tablet Take 1 tablet (0.25 mg total) by mouth 3 (three) times daily. 90 tablet 2   No current facility-administered medications on file prior to visit.    PAST MEDICAL HISTORY:   Past Medical History  Diagnosis Date  . Aneurysm (HCC)     s/p clips  . Glaucoma   . Parkinson's disease (HCC)   . Sleep apnea   . Hypotension     PAST SURGICAL HISTORY:   Past Surgical History  Procedure Laterality Date  . Craniotomy    . Aneurym clipping      SOCIAL HISTORY:   Social History   Social History  . Marital Status: Married    Spouse Name: N/A  . Number of Children: N/A  . Years of Education: N/A   Occupational History  . retired     Copy, Music therapist   Social History Main Topics  . Smoking status: Former Games developer  . Smokeless tobacco: Not on file     Comment: quit 40 years ago  . Alcohol Use: 4.2 oz/week    7 Glasses of wine, 0 Standard drinks or equivalent per week     Comment: 1-2 glasses wine/day (6-8 oz in each glass)  . Drug Use: No  . Sexual Activity: Yes   Other Topics Concern  . Not on file   Social History Narrative    FAMILY HISTORY:   Family Status  Relation Status Death Age  . Mother Deceased 41    fall  . Father Deceased 29    CAD, renal failure  . Brother Deceased     CVA, MI after hip replacement  . Brother Alive     bladder CA  . Child Alive     healthy    ROS:  A complete 10 system review of systems was obtained and was unremarkable apart from what is  mentioned above.  PHYSICAL EXAMINATION:    VITALS:   Filed Vitals:   10/24/15 1047  BP: 110/70  Pulse: 92  Height: 5' 8.5" (1.74 m)  Weight: 145 lb (65.772 kg)   No data found.     GEN:  The patient appears stated age and is in NAD.   HEENT:  Normocephalic, atraumatic.  The mucous membranes are moist. The superficial temporal arteries are without ropiness or tenderness. CV:  RRR Lungs:  CTAB.  She does have some inspiratory gasps. Neck/HEME:  There are no carotid bruits bilaterally.  Neurological examination:  Orientation: The patient is alert and oriented x3. Fund of knowledge is appropriate.  Recent and remote memory are intact.  Attention and concentration are normal.    Able to name objects and repeat phrases. Cranial nerves: There is good facial symmetry.  Extraocular muscles are intact.  There are no square wave jerks.  The visual fields are full to confrontational testing. The speech is fluent and clear. Soft palate rises symmetrically and there is no tongue deviation. Hearing is intact to conversational tone. Sensation: Sensation is intact to light and pinprick throughout (facial, trunk, extremities). Vibration is intact at the bilateral big toe. There is no extinction with double simultaneous stimulation. There is no sensory dermatomal level identified.   Motor: Strength is 5/5 in the bilateral upper and lower extremities.   Shoulder shrug is equal and symmetric.  There is no pronator drift.   Movement examination: Tone: There is mild increased tone in the RUE Abnormal movements: There is no tremor, even with distraction techniques. Coordination:  There is decremation with RAM's on the right Gait and Station: walks slowly down the hall with good stride length and decreased arm swing and slightly drags the right leg   ASSESSMENT/PLAN:  1.  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   -pt and husband had multiple questions today  re: MSA.    - Continue carbidopa/levodopa 25/100 2 tablets in the morning, with one in the afternoon and one in the evening.    -Continue on 0.25 mg 3 times per day.    -Continue carbidopa/levodopa 50/200 which seems to be helping nighttime cramping  -Pt has enough coming appointment for a second opinion at [facility] with Dr. Fidela Juneau.  -Talked to her again about her home situation.  Her bedroom is upstairs and I really don't want her walking up those stairs.  Would like them to try to find alternative (said have sunroom downstairs that they could turn into bedroom).  She continues to use her bedroom upstairs and they state that there is no bathtub or shower downstairs, although there is a toilet.  I told them that they really need to think about solutions for the future, especially if she is going to stay in the home.  -She asked me about using a walker instead of her transport chair when she goes to stay with her brother while her husband is increased.  I told her that a walker is not going to prevent her from falling when she has episodes of orthostasis.  She is really at the point in this disease where she needs to consider sitting down at all times.  The only type of walker I think would be helpful is a Teacher, early years/pre.  The other issue is that she falls backwards and not forwards, so only the [place] Pou could be of value.  I wrote her a prescription for this last visit, and she asks for another today.  Otherwise, I told her I think that she should stay seated  2.  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  Her daughter reported that it was one arm, but patient reports it is both arms and both legs, but she is awake when it happens.  These have been worked up  extensively in the past and determined all likely due to BP drops and significant orthostasis.  Shaking could represent convulsive syncope but the patient insists that she is awake when it happens.  In addition, she is having some shaking even without what she calls  a "sinking spell", so it is my suspicion that she has just seen progression of the disease.  I told her we could try her on something like Keppra, but I'm not sure how much value this would be.  She asked about risks, benefits, and side effects.  Ultimately, she asked me to hold off on that.  She is going to contact Dr. Rennis GoldenHilty and see if the Florinef can be raised.  She is already wearing compression stockings and an abdominal binder, but she really is not drinking very much water.  We talked about the importance of hydration.  I think this could potentially greatly help her blood pressure and her spells.   3.  Urinary frequency  -saw gyn.  On Myrbetriq, which she finds intermittently helpful.  4.  Sleep difficulty  -She is off of her clonazepam, which is being refilled by her primary care physician.  Unfortunately, she is back to drinking alcohol nightly.  I really would like her to discontinue this and we discussed at length again today.  5. Follow up is anticipated in the next 4 months, sooner should new neurologic issues arise.  She wanted to space out her visits somewhat, which I have no objection to.  Much greater than 50% of this visit was spent in counseling with the patient and the family.  Total face to face time:  40 min

## 2015-10-24 NOTE — Telephone Encounter (Signed)
New Message  Pt c/o medication issue: 1. Name of Medication: fludrocortisone (FLORINEF) 0.1 MG tablet  4. What is your medication issue? Pt seen Dr. Arbutus LeasAT believes that rasing the dose is needed.

## 2015-10-25 ENCOUNTER — Ambulatory Visit: Payer: 59 | Admitting: Occupational Therapy

## 2015-10-25 DIAGNOSIS — R29818 Other symptoms and signs involving the nervous system: Secondary | ICD-10-CM

## 2015-10-25 DIAGNOSIS — R293 Abnormal posture: Secondary | ICD-10-CM

## 2015-10-25 DIAGNOSIS — R2689 Other abnormalities of gait and mobility: Secondary | ICD-10-CM

## 2015-10-25 DIAGNOSIS — M25611 Stiffness of right shoulder, not elsewhere classified: Secondary | ICD-10-CM

## 2015-10-25 DIAGNOSIS — R278 Other lack of coordination: Secondary | ICD-10-CM | POA: Diagnosis not present

## 2015-10-25 DIAGNOSIS — R29898 Other symptoms and signs involving the musculoskeletal system: Secondary | ICD-10-CM

## 2015-10-25 NOTE — Telephone Encounter (Signed)
Ok to increase florinef to 0.2 mg daily.  Dr. HRexene Edison

## 2015-10-26 ENCOUNTER — Ambulatory Visit: Payer: Medicare Other | Admitting: Physical Therapy

## 2015-10-26 ENCOUNTER — Encounter: Payer: Medicare Other | Admitting: Occupational Therapy

## 2015-10-26 NOTE — Therapy (Signed)
[facility] [address] [place], [place], [address] Phone: [phone]   Fax:  [phone]  Occupational Therapy Treatment  Patient Details  Name: Elizabeth Mcintosh MRN: [id number] Date of Birth: June 15, 1945 No Data Recorded  Encounter Date: 10/25/2015    Past Medical History  Diagnosis Date  . Aneurysm ([place])     s/p clips  . Glaucoma   . Parkinson's disease ([place])   . Sleep apnea   . Hypotension     Past Surgical History  Procedure Laterality Date  . Craniotomy    . Aneurym clipping      There were no vitals filed for this visit.     Therapsit checked progress towards remaining goals and discussed with pt. Pt practiced donning/ doffing a pullover shirt using adapated strategy modified indpendently following instruction from therapist. Discussion with pt regarding safety and recommendation that she has someone with her when she goes up/ down the stairs to her bedroom. Therapist recommended pt may want to consider home modifications or a different living situation so she does not have to go/up down stairs in the future.  Therapist recommended pt pursue a lifeline for safety. Pt reports she will have a caregiver with her this summer for several hours per day while her husband is out of the country. Pt's son will come over in the evenings while her husband is gone.                         OT Short Term Goals - 10/25/15 1523    OT SHORT TERM GOAL #1   Title Pt will be independent with updated HEP. (ck 10/17/15)   Time 3   Period Weeks   Status Achieved  Pt verbalizes understanding   OT SHORT TERM GOAL #2   Title Pt will write at least 3 sentences with 100% legibility and only min decr in size.    Time 3   Status Partially Met  min-mod decrease in letter size, 95% legibility   OT SHORT TERM GOAL #3   Title Pt will demo at least 125* R shoulder flex for functional reaching with -20* elbow  extension or less.   Status Achieved  140 with -10 elbow extension   OT SHORT TERM GOAL #4   Title Pt will improve ability/ease with donning/doffing jacket as shown by improving time on PPT#4 by at least 8sec.   Time 4   Period Weeks   Status Achieved   OT SHORT TERM GOAL #5   Title Pt will perform PPT#2(simulated feeding) in 13  secs or less   Baseline 15.57 on 09/25/15   Time 3   Period Weeks   Status Not Met  18.32 secs with foam grip           OT Long Term Goals - 10/25/15 1515    OT LONG TERM GOAL #1   Title Pt will verbalize understanding of updated AE/strategies to increase ease/independence with ADLs/IADLs prn.--ck 11/02/15   Time 6   Period Weeks   Status Achieved   OT LONG TERM GOAL #2   Title Pt will improve coordination/functional reaching for ADLs as shown by improving score on box and blocks test by at least 5 blocks bilaterally.   Baseline R-38 blocks, L-38 blocks   Time 8   Period Weeks   Status Achieved   OT LONG TERM GOAL #3   Title Pt will demo at least 140* R  shoulder flex for functional reaching with elbow ext WNL.   Baseline 125 on 09/25/15   Time 6   Period Weeks   Status Partially Met  original goal met, upgraded goal not met pt demonstrates 130 with -12 extension   OT LONG TERM GOAL #4   Title Pt will improve coordination for ADLs as shown by improving time on 9-hole peg test by at least 5 sec with RUE.   Baseline R-42.94sec   Time 8   Period Weeks   Status Achieved   OT LONG TERM GOAL #5   Title Pt will improve ability/ease with donning/doffing jacket as shown by improving time on PPT#4 in 30sec or less.   Baseline 55.41sec   Time 8   Period Weeks   Status Achieved   OT LONG TERM GOAL #6   Title Pt will perform 3 button /unbutton in 35 secs or less    Baseline 42.75 secs   Time 6   Period Weeks   Status Not Met  53.37 secs               Plan - 10/26/15 1054    Clinical Impression Statement Pt agrees with plans for discharge  today. Pt demonstrated progress however she did not fully meet all goals due to BP issues and pt was not seen to frequency as she cancelled several appointments.   Rehab Potential Good   OT Frequency 2x / week   OT Duration 6 weeks   OT Treatment/Interventions Self-care/ADL training;Neuromuscular education;Therapist, nutritional;Therapeutic exercise   Plan discharge occupational therapy   OT Home Exercise Plan Education issued:  PWR! hands HEP, Coordination HEP 08/07/15, red theraband for shoulder abduction, extension, rowing   Consulted and Agree with Plan of Care Patient      Patient will benefit from skilled therapeutic intervention in order to improve the following deficits and impairments:  Decreased coordination, Decreased activity tolerance, Impaired tone, Impaired UE functional use, Decreased mobility, Decreased balance, Decreased range of motion  Visit Diagnosis: Other lack of coordination  Other symptoms and signs involving the nervous system  Other symptoms and signs involving the musculoskeletal system  Stiffness of right shoulder, not elsewhere classified  Abnormal posture  Other abnormalities of gait and mobility      G-Codes - Nov 04, 2015 1700    Functional Assessment Tool Used box/ blocks LUE 48 blocks, RUE 46 blocks   (button/ unbutton 53.37 secs), min-mod micrographia   Functional Limitation Self care   Self Care Goal Status (F6213) At least 20 percent but less than 40 percent impaired, limited or restricted   Self Care Discharge Status [id number]) At least 20 percent but less than 40 percent impaired, limited or restricted      Problem List Patient Active Problem List   Diagnosis Date Noted  . 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 Depression 11/22/2013  . 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 appointment cancellations.   Remaining deficits: Bradykinesia, rigidity, cognitive deficits, decreased balance, abnormal posture   Education / Equipment: Pt was educated regarding the following: safety recommendations(ie: lifeline, no climbing stairs without assist etc,)adapted strategies for ADLs.  And HEP. Pt verbalized understanding of all education.Pt is being discharged due  to reaching maximal potential at this time. Pt will benefit and OT evaluation in 3-6 mons due to the progressive nature of her diagnosis.  Plan: Patient agrees to discharge.  Patient goals were partially met. Patient is being discharged due to                                                     ?????      Kahleb Mcclane 10/26/2015, 10:59 AM Theone Murdoch, OTR/L [phone] Phone: [phone] 4:07 PM 10/26/2015 [facility] [address] [place], [place], [address] Phone: [phone]   Fax:  [phone]  Name: Elizabeth Mcintosh MRN: [id number] Date of Birth: 03/14/1946

## 2015-10-30 ENCOUNTER — Ambulatory Visit: Payer: 59 | Admitting: Physical Therapy

## 2015-10-30 MED ORDER — FLUDROCORTISONE ACETATE 0.1 MG PO TABS: 0.2000 mg | ORAL_TABLET | Freq: Every day | ORAL | Status: DC

## 2015-10-30 NOTE — Telephone Encounter (Signed)
Patient called with MD advice on meds. She voiced understanding. She needs refill of this medication for new dose + 15 tablets as she just got her previous Rx refilled (which was 1.5 tabs QD) Rx(s) sent to pharmacy electronically.

## 2015-11-02 ENCOUNTER — Ambulatory Visit: Payer: 59 | Admitting: Physical Therapy

## 2015-11-02 ENCOUNTER — Ambulatory Visit: Payer: 59

## 2015-11-02 DIAGNOSIS — R471 Dysarthria and anarthria: Secondary | ICD-10-CM

## 2015-11-02 NOTE — Therapy (Signed)
[facility] [address] [place], [place], [address] Phone: [phone]   Fax:  [phone]  Patient Details  Name: Elizabeth Mcintosh MRN: [id number] Date of Birth: 1946-01-03 Referring Provider:  No ref. provider found  Encounter Date: 11/02/2015  SPEECH THERAPY DISCHARGE SUMMARY  See last visit note for tracking of number of visits seen.  Current functional level related to goals / functional outcomes: Pt canceled last session after not being seen in ST for over two weeks. Her last goal update in her last therapy session was as follows. Pt had difficulty meeting loudness goals consistently.  SLP Short Term Goals - 10/16/15 1102     SLP SHORT TERM GOAL #1     Title  Pt will demonstrate loud /a/ of 85 dB over 3 sessions with rare min A     Status  Achieved     SLP SHORT TERM GOAL #2     Title  Pt will average 70dB during structured speech tasks with rare min A     Status  Not Met     SLP SHORT TERM GOAL #3     Title  Pt will average 70dB over 5 minute conversation with occasional min A     Status  Partially Met                SLP Long Term Goals - 10/17/15 1645     SLP LONG TERM GOAL #1     Title  Pt will maintain average of 69dB over 8 munute simple-mod complex conversation with occasional min A     Time  1     Period  Weeks     Status  Achieved     SLP LONG TERM GOAL #2     Title  Pt will demo 100% intelligibility in 8 minute conversation in a mod-max noisy environment with rare min A     Time  1     Period  Weeks     Status  Revised     SLP LONG TERM GOAL #3     Title  pt will maintain loud /a/ at average 85dB over 4 sessions     Time  1     Period  Weeks     Status  Achieved                Plan - 10/17/15 1644     Clinical Impression Statement  Pt requried SLP A to maintain loudness over conversations, and min cues for keeping "frog voice" from appearing. Continue skilled ST to maximize carryover of  loudness and intellgibility. Next session will be pt's last session.     Speech Therapy Frequency  2x / week     Duration  4 weeks        Remaining deficits: Deficits remain.   Education / Equipment: Loud /a/, compensations for "frog voice".  Plan: Patient agrees to discharge.  Patient goals were not met. Patient is being discharged due to not returning since the last visit.  ????? SLP was supposed to give pt some information on Lifeline services from her OT on the last therapy session (which pt canceled), as there were concerns about the patient's safety over the summer with her husband out of the country for over a month.        [facility] ,[place], [place]   11/02/2015, 8:03 AM  [facility] [address] [place], [place], [address]  Phone: [phone]   Fax:  [phone]

## 2015-11-16 ENCOUNTER — Emergency Department (HOSPITAL_COMMUNITY)
Admission: EM | Admit: 2015-11-16 | Discharge: 2015-11-16 | Disposition: A | Discharge status: Home / Self Care | Payer: Medicare Other | Attending: Emergency Medicine | Admitting: Emergency Medicine

## 2015-11-16 DIAGNOSIS — Z87891 Personal history of nicotine dependence: Secondary | ICD-10-CM | POA: Insufficient documentation

## 2015-11-16 DIAGNOSIS — Z7982 Long term (current) use of aspirin: Secondary | ICD-10-CM | POA: Insufficient documentation

## 2015-11-16 DIAGNOSIS — Z79899 Other long term (current) drug therapy: Secondary | ICD-10-CM | POA: Insufficient documentation

## 2015-11-16 DIAGNOSIS — G232 Striatonigral degeneration: Secondary | ICD-10-CM

## 2015-11-16 DIAGNOSIS — G903 Multi-system degeneration of the autonomic nervous system: Secondary | ICD-10-CM | POA: Diagnosis not present

## 2015-11-16 DIAGNOSIS — I951 Orthostatic hypotension: Secondary | ICD-10-CM

## 2015-11-16 DIAGNOSIS — R531 Weakness: Secondary | ICD-10-CM | POA: Diagnosis present

## 2015-11-16 LAB — URINALYSIS, ROUTINE W REFLEX MICROSCOPIC
Bilirubin Urine: NEGATIVE
Glucose, UA: NEGATIVE mg/dL
Hgb urine dipstick: NEGATIVE
Ketones, ur: NEGATIVE mg/dL
Nitrite: NEGATIVE
Protein, ur: NEGATIVE mg/dL
Specific Gravity, Urine: 1.014 (ref 1.005–1.030)
pH: 6 (ref 5.0–8.0)

## 2015-11-16 LAB — CBC WITH DIFFERENTIAL/PLATELET
Basophils Absolute: 0 10*3/uL (ref 0.0–0.1)
Basophils Relative: 0 %
Eosinophils Absolute: 0.1 10*3/uL (ref 0.0–0.7)
Eosinophils Relative: 2 %
HCT: 41.6 % (ref 36.0–46.0)
Hemoglobin: 14.1 g/dL (ref 12.0–15.0)
Lymphocytes Relative: 16 %
Lymphs Abs: 1.1 10*3/uL (ref 0.7–4.0)
MCH: 30.3 pg (ref 26.0–34.0)
MCHC: 33.9 g/dL (ref 30.0–36.0)
MCV: 89.5 fL (ref 78.0–100.0)
Monocytes Absolute: 0.4 10*3/uL (ref 0.1–1.0)
Monocytes Relative: 6 %
Neutro Abs: 5.2 10*3/uL (ref 1.7–7.7)
Neutrophils Relative %: 76 %
Platelets: 231 10*3/uL (ref 150–400)
RBC: 4.65 MIL/uL (ref 3.87–5.11)
RDW: 13.1 % (ref 11.5–15.5)
WBC: 6.9 10*3/uL (ref 4.0–10.5)

## 2015-11-16 LAB — BASIC METABOLIC PANEL
Anion gap: 10 (ref 5–15)
BUN: 9 mg/dL (ref 6–20)
CO2: 29 mmol/L (ref 22–32)
Calcium: 10 mg/dL (ref 8.9–10.3)
Chloride: 99 mmol/L — ABNORMAL LOW (ref 101–111)
Creatinine, Ser: 0.83 mg/dL (ref 0.44–1.00)
GFR calc Af Amer: >60 mL/min (ref >60)
GFR calc non Af Amer: >60 mL/min (ref >60)
Glucose, Bld: 107 mg/dL — ABNORMAL HIGH (ref 65–99)
Potassium: 3.9 mmol/L (ref 3.5–5.1)
Sodium: 138 mmol/L (ref 135–145)

## 2015-11-16 LAB — URINE MICROSCOPIC-ADD ON
Bacteria, UA: NONE SEEN
RBC / HPF: NONE SEEN RBC/hpf (ref 0–5)

## 2015-11-16 LAB — CBG MONITORING, ED: Glucose-Capillary: 95 mg/dL (ref 65–99)

## 2015-11-16 NOTE — Discharge Instructions (Signed)
Hypotension  As your heart beats, it forces blood through your arteries. This force is your blood pressure. If your blood pressure is too low for you to go about your normal activities or to support the organs of your body, you have hypotension. Hypotension is also referred to as low blood pressure. When your blood pressure becomes too low, you may not get enough blood to your brain. As a result, you may feel weak, feel lightheaded, or develop a rapid heart rate. In a more severe case, you may faint.  CAUSES  Various conditions can cause hypotension. These include:  · Blood loss.  · Dehydration.  · Heart or endocrine problems.  · Pregnancy.  · Severe infection.  · Not having a well-balanced diet filled with needed nutrients.  · Severe allergic reactions (anaphylaxis).  Some medicines, such as blood pressure medicine or water pills (diuretics), may lower your blood pressure below normal. Sometimes taking too much medicine or taking medicine not as directed can cause hypotension.  TREATMENT   Hospitalization is sometimes required for hypotension if fluid or blood replacement is needed, if time is needed for medicines to wear off, or if further monitoring is needed. Treatment might include changing your diet, changing your medicines (including medicines aimed at raising your blood pressure), and use of support stockings.  HOME CARE INSTRUCTIONS   · Drink enough fluids to keep your urine clear or pale yellow.  · Take your medicines as directed by your health care provider.  · Get up slowly from reclining or sitting positions. This gives your blood pressure a chance to adjust.  · Wear support stockings as directed by your health care provider.  · Maintain a healthy diet by including nutritious food, such as fruits, vegetables, nuts, whole grains, and lean meats.  SEEK MEDICAL CARE IF:  · You have vomiting or diarrhea.  · You have a fever for more than 2-3 days.  · You feel more thirsty than usual.  · You feel weak and  tired.  SEEK IMMEDIATE MEDICAL CARE IF:   · You have chest pain or a fast or irregular heartbeat.  · You have a loss of feeling in some part of your body, or you lose movement in your arms or legs.  · You have trouble speaking.  · You become sweaty or feel lightheaded.  · You faint.  MAKE SURE YOU:   · Understand these instructions.  · Will watch your condition.  · Will get help right away if you are not doing well or get worse.     This information is not intended to replace advice given to you by your health care provider. Make sure you discuss any questions you have with your health care provider.     Document Released: 05/26/2005 Document Revised: 03/16/2013 Document Reviewed: 11/26/2012  Elsevier Interactive Patient Education ©2016 [employer]

## 2015-11-16 NOTE — ED Notes (Signed)
Patient brought in by [facility]. BP of 61/34, pulse of 66, respirations 16, and O2 of 94% on truck. Patients CNA called out for new onset of weakness and unresponsiveness of patient. Rectal temp upon arrival is 97.9. Patient has a history of multiple system atrophy and sustained a reported fall last night from her bed and complains of mild pain behind the right ear.

## 2015-11-19 ENCOUNTER — Other Ambulatory Visit: Payer: Self-pay | Admitting: Neurology

## 2015-11-19 NOTE — Telephone Encounter (Signed)
Carbidopa Levodopa 50/200 refill requested. Per last office note- patient to remain on medication. Refill approved and sent to patient's pharmacy.   

## 2015-11-30 NOTE — ED Provider Notes (Signed)
CSN: [id number]     Arrival date & time 11/16/15  1008 History   First MD Initiated Contact with Patient 11/16/15 1014     Chief Complaint  Patient presents with  . Weakness     (Consider location/radiation/quality/duration/timing/severity/associated sxs/prior Treatment) HPI  70 year old female presenting with what she is calling "a sinking spell." She has a past history of multiple system atrophy and autonomic postural hypotension. She reports that she recently had new caretakers. She reports an episode where she was not very responsive shortly before arrival. Caretaker became concerned and called to have her evaluated. She currently has no new complaints. She feels like she is at her baseline. No acute pain.  Past Medical History  Diagnosis Date  . Aneurysm ([facility])     s/p clips  . Glaucoma   . Parkinson's disease ([facility])   . Sleep apnea   . Hypotension    Past Surgical History  Procedure Laterality Date  . Craniotomy    . Aneurym clipping     Family History  Problem Relation Age of Onset  . Dementia Mother   . Heart attack Father   . Kidney failure Father   . Stroke Brother   . Cancer Brother     Prostate  . Diabetes Brother   . Aneurysm Brother    Social History  Substance Use Topics  . Smoking status: Former Games developermoker  . Smokeless tobacco: Not on file     Comment: quit 40 years ago  . Alcohol Use: 4.2 oz/week    7 Glasses of wine, 0 Standard drinks or equivalent per week     Comment: 1-2 glasses wine/day (6-8 oz in each glass)   OB History    Gravida Para Term Preterm AB TAB SAB Ectopic Multiple Living   2 2        2      Review of Systems  All systems reviewed and negative, other than as noted in HPI.  Allergies  Penicillins  Home Medications   Prior to Admission medications   Medication Sig Start Date End Date Taking? Authorizing Provider  AMBULATORY NON FORMULARY MEDICATION 1 Device by Does not apply route daily. Les PouMerry Walker 10/24/15   Octaviano Battyebecca S Tat, DO   aspirin 81 MG tablet Take 81 mg by mouth daily.    Historical Provider, MD  bimatoprost (LUMIGAN) 0.01 % SOLN Place 1 drop into both eyes at bedtime.    Historical Provider, MD  carbidopa-levodopa (SINEMET CR) 50-200 MG tablet TAKE 1 TABLET BY MOUTH AT BEDTIME 11/19/15   Rebecca S Tat, DO  carbidopa-levodopa (SINEMET IR) 25-100 MG per tablet TAKE 2 TABLETS IN THE AM, 1 TABLET AT NOON, 1 IN THE EVENING 01/22/15   Octaviano Battyebecca S Tat, DO  Droxidopa 300 MG CAPS Take 2 tablets three times a day 05/31/15   Chrystie NoseKenneth C Hilty, MD  fludrocortisone (FLORINEF) 0.1 MG tablet Take 2 tablets (0.2 mg total) by mouth daily. 10/30/15   Chrystie NoseKenneth C Hilty, MD  LYSINE PO Take by mouth.    Historical Provider, MD  Melatonin 5 MG CAPS Take by mouth at bedtime.    Historical Provider, MD  MYRBETRIQ 25 MG TB24 tablet Take 25 mg by mouth daily.  10/19/14   Historical Provider, MD  pramipexole (MIRAPEX) 0.25 MG tablet Take 1 tablet (0.25 mg total) by mouth 3 (three) times daily. 03/30/15   Rebecca S Tat, DO   BP 179/93 mmHg  Pulse 86  Temp(Src) 97.8 F (36.6 C) (Oral)  Resp  11  Ht 5\' 8"  (1.727 m)  Wt 140 lb (63.504 kg)  BMI 21.29 kg/m2  SpO2 97% Physical Exam  Constitutional: She is oriented to person, place, and time. She appears well-developed and well-nourished. No distress.  HENT:  Head: Normocephalic and atraumatic.  Eyes: Conjunctivae are normal. Right eye exhibits no discharge. Left eye exhibits no discharge.  Neck: Neck supple.  Cardiovascular: Normal rate, regular rhythm and normal heart sounds.  Exam reveals no gallop and no friction rub.   No murmur heard. Pulmonary/Chest: Effort normal and breath sounds normal. No respiratory distress.  Abdominal: Soft. She exhibits no distension. There is no tenderness.  Musculoskeletal: She exhibits no edema or tenderness.  Neurological: She is alert and oriented to person, place, and time.  Speech is clear, but somewhat slow. Answers all questions appropriately. Follows  commands. Cranial nerves II through XII appear to be intact. No focal motor deficit noted.  Skin: Skin is warm and dry.  Psychiatric: She has a normal mood and affect. Her behavior is normal. Thought content normal.  Nursing note and vitals reviewed.   ED Course  Procedures (including critical care time) Labs Review Labs Reviewed  BASIC METABOLIC PANEL - Abnormal; Notable for the following:    Chloride 99 (*)    Glucose, Bld 107 (*)    All other components within normal limits  URINALYSIS, ROUTINE W REFLEX MICROSCOPIC (NOT [facility]) - Abnormal; Notable for the following:    Leukocytes, UA MODERATE (*)    All other components within normal limits  URINE MICROSCOPIC-ADD ON - Abnormal; Notable for the following:    Squamous Epithelial / LPF 0-5 (*)    Casts HYALINE CASTS (*)    All other components within normal limits  CBC WITH DIFFERENTIAL/PLATELET  CBG MONITORING, ED    Imaging Review No results found. I have personally reviewed and evaluated these images and lab results as part of my medical decision-making.   EKG Interpretation   Date/Time:  Friday November 16 2015 10:08:39 EDT Ventricular Rate:  73 PR Interval:  151 QRS Duration: 104 QT Interval:  407 QTC Calculation: 448 R Axis:   149 Text Interpretation:  Right and left arm electrode reversal,  interpretation assumes no reversal Sinus rhythm LAE, consider biatrial  enlargement Probable lateral infarct, old Minimal ST elevation, anterior  leads Since previous tracing arm leads are reversed or axis has changed  Confirmed by Karma GanjaLINKER  MD, MARTHA [phone]([id number]) on 11/17/2015 8:49:28 PM      MDM   Final diagnoses:  Multiple system atrophy, Parkinson variant ([facility])  Autonomic postural hypotension    70 year old female presenting with an episode which she feel is consistent with for multiple system atrophy. She has no other specific concerns. She reports that she is at her baseline. She is somewhat slow to answer questions, but  this were appropriately. Workup fairly unremarkable. A low suspicion for more sinister process. She is comfortable with discharge. Return precautions were discussed.    Raeford RazorStephen Amon Costilla, MD 11/30/15 2119

## 2015-12-10 ENCOUNTER — Other Ambulatory Visit: Payer: Self-pay | Admitting: Neurology

## 2015-12-10 MED ORDER — PRAMIPEXOLE DIHYDROCHLORIDE 0.25 MG PO TABS: 0.2500 mg | ORAL_TABLET | Freq: Three times a day (TID) | ORAL | Status: DC

## 2015-12-10 NOTE — Telephone Encounter (Signed)
pramipexole refill requested. Per last office note- patient to remain on medication. Refill approved and sent to patient's pharmacy.

## 2016-02-19 ENCOUNTER — Telehealth: Payer: Self-pay | Admitting: Neurology

## 2016-02-19 ENCOUNTER — Emergency Department (HOSPITAL_COMMUNITY): Payer: Medicare Other

## 2016-02-19 ENCOUNTER — Observation Stay (HOSPITAL_COMMUNITY)
Admission: EM | Admit: 2016-02-19 | Discharge: 2016-02-22 | Disposition: A | Discharge status: Skilled Nursing Facility | Payer: Medicare Other | Attending: Internal Medicine | Admitting: Internal Medicine

## 2016-02-19 ENCOUNTER — Encounter (HOSPITAL_COMMUNITY): Payer: Self-pay | Admitting: Emergency Medicine

## 2016-02-19 ENCOUNTER — Observation Stay (HOSPITAL_COMMUNITY): Payer: Medicare Other

## 2016-02-19 DIAGNOSIS — I672 Cerebral atherosclerosis: Secondary | ICD-10-CM | POA: Insufficient documentation

## 2016-02-19 DIAGNOSIS — Z79899 Other long term (current) drug therapy: Secondary | ICD-10-CM | POA: Diagnosis not present

## 2016-02-19 DIAGNOSIS — R4701 Aphasia: Secondary | ICD-10-CM | POA: Diagnosis not present

## 2016-02-19 DIAGNOSIS — G138 Systemic atrophy primarily affecting central nervous system in other diseases classified elsewhere: Secondary | ICD-10-CM | POA: Insufficient documentation

## 2016-02-19 DIAGNOSIS — G9389 Other specified disorders of brain: Secondary | ICD-10-CM | POA: Insufficient documentation

## 2016-02-19 DIAGNOSIS — I951 Orthostatic hypotension: Secondary | ICD-10-CM | POA: Diagnosis present

## 2016-02-19 DIAGNOSIS — G20 Parkinson's disease: Secondary | ICD-10-CM | POA: Diagnosis present

## 2016-02-19 DIAGNOSIS — R531 Weakness: Secondary | ICD-10-CM | POA: Insufficient documentation

## 2016-02-19 DIAGNOSIS — G232 Striatonigral degeneration: Secondary | ICD-10-CM | POA: Diagnosis present

## 2016-02-19 DIAGNOSIS — Z87891 Personal history of nicotine dependence: Secondary | ICD-10-CM | POA: Diagnosis not present

## 2016-02-19 DIAGNOSIS — N3090 Cystitis, unspecified without hematuria: Secondary | ICD-10-CM | POA: Clinically undetermined

## 2016-02-19 DIAGNOSIS — I6521 Occlusion and stenosis of right carotid artery: Secondary | ICD-10-CM | POA: Diagnosis not present

## 2016-02-19 DIAGNOSIS — E785 Hyperlipidemia, unspecified: Secondary | ICD-10-CM | POA: Diagnosis not present

## 2016-02-19 DIAGNOSIS — H409 Unspecified glaucoma: Secondary | ICD-10-CM | POA: Insufficient documentation

## 2016-02-19 DIAGNOSIS — I639 Cerebral infarction, unspecified: Secondary | ICD-10-CM | POA: Diagnosis not present

## 2016-02-19 DIAGNOSIS — G9340 Encephalopathy, unspecified: Secondary | ICD-10-CM | POA: Diagnosis present

## 2016-02-19 DIAGNOSIS — H5347 Heteronymous bilateral field defects: Secondary | ICD-10-CM | POA: Diagnosis not present

## 2016-02-19 DIAGNOSIS — G4730 Sleep apnea, unspecified: Secondary | ICD-10-CM | POA: Diagnosis not present

## 2016-02-19 DIAGNOSIS — Z7982 Long term (current) use of aspirin: Secondary | ICD-10-CM | POA: Insufficient documentation

## 2016-02-19 DIAGNOSIS — F329 Major depressive disorder, single episode, unspecified: Secondary | ICD-10-CM | POA: Insufficient documentation

## 2016-02-19 DIAGNOSIS — G20A1 Parkinson's disease without dyskinesia, without mention of fluctuations: Secondary | ICD-10-CM | POA: Diagnosis present

## 2016-02-19 LAB — COMPREHENSIVE METABOLIC PANEL
ALBUMIN: 4.3 g/dL (ref 3.5–5.0)
ALK PHOS: 82 U/L (ref 38–126)
ALT: 5 U/L — AB (ref 14–54)
AST: 21 U/L (ref 15–41)
Anion gap: 10 (ref 5–15)
BILIRUBIN TOTAL: 1.7 mg/dL — AB (ref 0.3–1.2)
BUN: 20 mg/dL (ref 6–20)
CALCIUM: 9.7 mg/dL (ref 8.9–10.3)
CO2: 27 mmol/L (ref 22–32)
Chloride: 101 mmol/L (ref 101–111)
Creatinine, Ser: 0.94 mg/dL (ref 0.44–1.00)
GFR calc Af Amer: >60 mL/min (ref >60)
GFR calc non Af Amer: >60 mL/min (ref >60)
GLUCOSE: 156 mg/dL — AB (ref 65–99)
Potassium: 4.5 mmol/L (ref 3.5–5.1)
SODIUM: 138 mmol/L (ref 135–145)
TOTAL PROTEIN: 6.6 g/dL (ref 6.5–8.1)

## 2016-02-19 LAB — I-STAT CHEM 8, ED
BUN: 22 mg/dL — AB (ref 6–20)
CALCIUM ION: 1.16 mmol/L (ref 1.15–1.40)
CHLORIDE: 97 mmol/L — AB (ref 101–111)
CREATININE: 0.8 mg/dL (ref 0.44–1.00)
Glucose, Bld: 149 mg/dL — ABNORMAL HIGH (ref 65–99)
HEMATOCRIT: 43 % (ref 36.0–46.0)
Hemoglobin: 14.6 g/dL (ref 12.0–15.0)
Potassium: 4.4 mmol/L (ref 3.5–5.1)
Sodium: 138 mmol/L (ref 135–145)
TCO2: 27 mmol/L (ref 0–100)

## 2016-02-19 LAB — CBC
HCT: 41.7 % (ref 36.0–46.0)
HEMOGLOBIN: 14 g/dL (ref 12.0–15.0)
MCH: 30.4 pg (ref 26.0–34.0)
MCHC: 33.6 g/dL (ref 30.0–36.0)
MCV: 90.5 fL (ref 78.0–100.0)
Platelets: 231 10*3/uL (ref 150–400)
RBC: 4.61 MIL/uL (ref 3.87–5.11)
RDW: 12.5 % (ref 11.5–15.5)
WBC: 9 10*3/uL (ref 4.0–10.5)

## 2016-02-19 LAB — DIFFERENTIAL
BASOS ABS: 0 10*3/uL (ref 0.0–0.1)
Basophils Relative: 0 %
Eosinophils Absolute: 0 10*3/uL (ref 0.0–0.7)
Eosinophils Relative: 0 %
LYMPHS ABS: 1.1 10*3/uL (ref 0.7–4.0)
LYMPHS PCT: 13 %
Monocytes Absolute: 0.5 10*3/uL (ref 0.1–1.0)
Monocytes Relative: 5 %
NEUTROS ABS: 7.3 10*3/uL (ref 1.7–7.7)
NEUTROS PCT: 82 %

## 2016-02-19 LAB — PROTIME-INR
INR: 1.05
Prothrombin Time: 13.7 seconds (ref 11.4–15.2)

## 2016-02-19 LAB — TROPONIN I

## 2016-02-19 LAB — APTT: APTT: 28 s (ref 24–36)

## 2016-02-19 LAB — I-STAT TROPONIN, ED: Troponin i, poc: 0.01 ng/mL (ref 0.00–0.08)

## 2016-02-19 MED ORDER — ASPIRIN EC 325 MG PO TBEC
325.0000 mg | DELAYED_RELEASE_TABLET | Freq: Every day | ORAL | Status: DC
  Administered 2016-02-19 – 2016-02-20 (×2): 325 mg via ORAL
  Filled 2016-02-19 (×2): qty 1

## 2016-02-19 MED ORDER — PRAMIPEXOLE DIHYDROCHLORIDE 0.25 MG PO TABS
0.2500 mg | ORAL_TABLET | Freq: Three times a day (TID) | ORAL | Status: DC
  Administered 2016-02-19 – 2016-02-22 (×9): 0.25 mg via ORAL
  Filled 2016-02-19 (×11): qty 1

## 2016-02-19 MED ORDER — DROXIDOPA 300 MG PO CAPS
600.0000 mg | ORAL_CAPSULE | Freq: Three times a day (TID) | ORAL | Status: DC
Start: 2016-02-19 — End: 2016-02-19
  Administered 2016-02-19: 600 mg via ORAL

## 2016-02-19 MED ORDER — DROXIDOPA 300 MG PO CAPS
600.0000 mg | ORAL_CAPSULE | Freq: Three times a day (TID) | ORAL | Status: DC
  Administered 2016-02-20 – 2016-02-22 (×8): 600 mg via ORAL
  Filled 2016-02-19 (×9): qty 300

## 2016-02-19 MED ORDER — FLUDROCORTISONE ACETATE 0.1 MG PO TABS
0.2000 mg | ORAL_TABLET | Freq: Every day | ORAL | Status: DC
  Administered 2016-02-20 – 2016-02-22 (×3): 0.2 mg via ORAL
  Filled 2016-02-19 (×4): qty 2

## 2016-02-19 MED ORDER — SODIUM CHLORIDE 0.9 % IV BOLUS (SEPSIS): 1000.0000 mL | Freq: Once | INTRAVENOUS | Status: DC

## 2016-02-19 MED ORDER — ASPIRIN 300 MG RE SUPP
300.0000 mg | Freq: Every day | RECTAL | Status: DC
  Filled 2016-02-19: qty 1

## 2016-02-19 MED ORDER — IOPAMIDOL (ISOVUE-370) INJECTION 76%
INTRAVENOUS | Status: AC
  Administered 2016-02-19: 50 mL
  Filled 2016-02-19: qty 50

## 2016-02-19 MED ORDER — ENOXAPARIN SODIUM 40 MG/0.4ML ~~LOC~~ SOLN
40.0000 mg | SUBCUTANEOUS | Status: DC
  Administered 2016-02-19 – 2016-02-21 (×3): 40 mg via SUBCUTANEOUS
  Filled 2016-02-19 (×3): qty 0.4

## 2016-02-19 MED ORDER — LATANOPROST 0.005 % OP SOLN
1.0000 [drp] | Freq: Every day | OPHTHALMIC | Status: DC
  Administered 2016-02-21 (×2): 1 [drp] via OPHTHALMIC
  Filled 2016-02-19 (×2): qty 2.5

## 2016-02-19 MED ORDER — STROKE: EARLY STAGES OF RECOVERY BOOK
Freq: Once | Status: AC
  Administered 2016-02-19: 23:00:00
  Filled 2016-02-19: qty 1

## 2016-02-19 MED ORDER — MIRABEGRON ER 25 MG PO TB24
25.0000 mg | ORAL_TABLET | Freq: Every day | ORAL | Status: DC
  Administered 2016-02-19 – 2016-02-21 (×3): 25 mg via ORAL
  Filled 2016-02-19 (×4): qty 1

## 2016-02-19 NOTE — ED Provider Notes (Signed)
[facility]-EMERGENCY DEPT Provider Note   CSN: [id number] Arrival date & time: 02/19/16  1332     History   Chief Complaint Chief Complaint  Patient presents with  . Code Stroke    HPI Elizabeth Mcintosh is a 70 y.o. female.  Patient with a history of multiple system atrophy, Parkinson's disease and prior aneurysmal clipping presents as a code stroke. She was last seen normal at 8:30 this morning. When her husband went back to check on her about 10:30, she was having difficulty with speech. She has generalized weakness at baseline. No other recent illnesses. History is limited due to her speech deficit.      Past Medical History:  Diagnosis Date  . Aneurysm (HCC)    s/p clips  . Glaucoma   . Hypotension   . Parkinson's disease (HCC)   . Sleep apnea     Patient Active Problem List   Diagnosis Date Noted  . CVA (cerebral infarction) 02/19/2016  . 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 Depression 11/22/2013  . 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 aneurym clipping    . CRANIOTOMY      OB History    Gravida Para Term Preterm AB Living   2 2       2    SAB TAB Ectopic Multiple Live Births                   Home Medications    Prior to Admission medications   Medication Sig Start Date End Date Taking? Authorizing Provider  AMBULATORY NON FORMULARY MEDICATION 1 Device by Does not apply route daily. Les Pou 10/24/15   Octaviano Batty Tat, DO  aspirin 81 MG tablet Take 81 mg by mouth daily.    Historical Provider, MD  bimatoprost (LUMIGAN) 0.01 % SOLN Place 1 drop into both eyes at bedtime.    Historical Provider, MD  carbidopa-levodopa (SINEMET CR) 50-200 MG tablet TAKE 1 TABLET BY MOUTH AT BEDTIME 11/19/15   Rebecca S Tat, DO  carbidopa-levodopa (SINEMET IR) 25-100 MG per tablet TAKE 2 TABLETS IN THE AM, 1 TABLET AT NOON, 1 IN THE EVENING  01/22/15   Octaviano Batty Tat, DO  Droxidopa 300 MG CAPS Take 2 tablets three times a day 05/31/15   Chrystie Nose, MD  fludrocortisone (FLORINEF) 0.1 MG tablet Take 2 tablets (0.2 mg total) by mouth daily. 10/30/15   Chrystie Nose, MD  LYSINE PO Take by mouth.    Historical Provider, MD  Melatonin 5 MG CAPS Take by mouth at bedtime.    Historical Provider, MD  MYRBETRIQ 25 MG TB24 tablet Take 25 mg by mouth daily.  10/19/14   Historical Provider, MD  pramipexole (MIRAPEX) 0.25 MG tablet Take 1 tablet (0.25 mg total) by mouth 3 (three) times daily. 12/10/15   Vladimir Faster, DO    Family History Family History  Problem Relation Age of Onset  . Dementia Mother   . Heart attack Father   . Kidney failure Father   . Stroke Brother   . Cancer Brother     Prostate  . Diabetes Brother   . Aneurysm Brother     Social History Social History  Substance Use Topics  . Smoking status: Former Games developer  . Smokeless tobacco: Not on file     Comment: quit 40 years ago  . Alcohol use 4.2  oz/week    7 Glasses of wine per week     Comment: 1-2 glasses wine/day (6-8 oz in each glass)     Allergies   Penicillins   Review of Systems Review of Systems  Unable to perform ROS: Acuity of condition     Physical Exam Updated Vital Signs BP 152/93 (BP Location: Right Arm)   Temp 97.7 F (36.5 C) (Oral)   Resp 15   Wt 143 lb 1.3 oz (64.9 kg)   SpO2 97%   BMI 21.76 kg/m   Physical Exam  Constitutional: She is oriented to person, place, and time. She appears well-developed and well-nourished.  HENT:  Head: Normocephalic and atraumatic.  Eyes: Pupils are equal, round, and reactive to light.  Neck: Normal range of motion. Neck supple.  Cardiovascular: Normal rate, regular rhythm and normal heart sounds.   Pulmonary/Chest: Effort normal and breath sounds normal. No respiratory distress. She has no wheezes. She has no rales. She exhibits no tenderness.  Abdominal: Soft. Bowel sounds are normal.  There is no tenderness. There is no rebound and no guarding.  Musculoskeletal: Normal range of motion. She exhibits no edema.  Lymphadenopathy:    She has no cervical adenopathy.  Neurological: She is alert and oriented to person, place, and time.  Patient is alert and controlling her airway. She has a aphasia. She has vision deficit noted. Neurology. She has questionable sensory deficits to her extremities but it's difficult to ascertain given her aphasia.  Skin: Skin is warm and dry. No rash noted.  Psychiatric: She has a normal mood and affect.     ED Treatments / Results  Labs (all labs ordered are listed, but only abnormal results are displayed) Labs Reviewed  COMPREHENSIVE METABOLIC PANEL - Abnormal; Notable for the following:       Result Value   Glucose, Bld 156 (*)    ALT 5 (*)    Total Bilirubin 1.7 (*)    All other components within normal limits  I-STAT CHEM 8, ED - Abnormal; Notable for the following:    Chloride 97 (*)    BUN 22 (*)    Glucose, Bld 149 (*)    All other components within normal limits  PROTIME-INR  APTT  CBC  DIFFERENTIAL  I-STAT TROPOININ, ED  CBG MONITORING, ED    EKG  EKG Interpretation  Date/Time:  Tuesday February 19 2016 13:47:52 EDT Ventricular Rate:  87 PR Interval:    QRS Duration: 105 QT Interval:  369 QTC Calculation: 444 R Axis:   52 Text Interpretation:  Sinus rhythm Biatrial enlargement RSR' in V1 or V2, right VCD or RVH since last tracing no significant change Confirmed by Abu Heavin  MD, Alesandra Smart ([id number]) on 02/19/2016 2:04:41 PM       Radiology Ct Head Code Stroke W/o Cm  Result Date: 02/19/2016 CLINICAL DATA:  Code stroke.  Sudden onset aphasia. EXAM: CT HEAD WITHOUT CONTRAST TECHNIQUE: Contiguous axial images were obtained from the base of the skull through the vertex without intravenous contrast. 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 Encephalomalacia in the anterior right temporal and inferior right frontal lobes and anterior right basal ganglia is unchanged. Ex vacuo enlargement of the right frontal horn is unchanged. There is a chronic lacunar infarct in the left caudate head. There is no evidence of acute cortical infarct, intracranial hemorrhage, mass, midline shift, or extra-axial fluid collection. Orbits are  unremarkable. Paranasal sinuses and mastoid air cells are clear. Calcified atherosclerosis is noted at the skullbase. ASPECTS [facility] Stroke Program Early CT Score) - Ganglionic level infarction (caudate, lentiform nuclei, internal capsule, insula, M1-M3 cortex): 7 - Supraganglionic infarction (M4-M6 cortex): 3 Total score (0-10 with 10 being normal): 10 IMPRESSION: 1. No evidence of acute intracranial abnormality. 2. ASPECTS is 10. 3. Unchanged encephalomalacia in the right cerebral hemisphere. Chronic left caudate lacunar infarct. These results were called by telephone at the time of interpretation on 02/19/2016 at 2:01 pm to Dr. Amada Jupiter, who verbally acknowledged these results. Electronically Signed   By: Sebastian Ache M.D.   On: 02/19/2016 14:02    Procedures Procedures (including critical care time)  Medications Ordered in ED Medications - No data to display   Initial Impression / Assessment and Plan / ED Course  I have reviewed the triage vital signs and the nursing notes.  Pertinent labs & imaging results that were available during my care of the patient were reviewed by me and considered in my medical decision making (see chart for details).  Clinical Course    Patient has no evidence of intracranial hemorrhage. She was out of the window for TPA. She will be admitted to the hospitalist service. Neurology has seen the patient.  Final Clinical Impressions(s) / ED Diagnoses   Final diagnoses:  Cerebral infarction due to unspecified mechanism    New Prescriptions New  Prescriptions   No medications on file     Rolan Bucco, MD 02/19/16 1427

## 2016-02-19 NOTE — Telephone Encounter (Signed)
PT's husband left a voicemail message saying there is something wrong with his wife she can't talk and thinks she might have suffered a stroke, he wants to know if she should go to the ER/Dawn CB# [phone]

## 2016-02-19 NOTE — Telephone Encounter (Signed)
fyi

## 2016-02-19 NOTE — Telephone Encounter (Signed)
It appears that she just arrived to the ED.  Thanks!

## 2016-02-19 NOTE — H&P (Signed)
History and Physical    Elizabeth Mcintosh EAV:[id number] DOB: 11/10/1945 DOA: 02/19/2016   PCP: Gwen Pounds, MD   Patient coming from/Resides with: Private residence/lives with husband  Admission status: Observation/telemetry  Chief Complaint: Aphasia  HPI: Elizabeth Mcintosh is a 70 y.o. female with medical history significant for akinetic rigid Parkinson's disease with associated multiple system atrophy, autonomic postural hypotension, prior craniotomy and aneurysm clipping, and depression. Husband reported that this morning around 8:30 AM he got his wife out of bed as usual but her to the chair and went downstairs to make her breakfast. Just about one hour later he came back to check on her to get her breakfast and noticed she was unable to talk and she was essentially only mumbling. Otherwise she seemed to be in her usual state of chronic debilitation. She is chronically incontinent and wears a diaper. She can walk with help utilizing a cane. She did not appear to have motor deficits. She was otherwise alert and at her baseline mentation. Upon arrival to the ER she was still aphasic so history had to be obtained from the husband by the EDP. She presented as a code stroke but unfortunately presented out of the window for treatment but she was evaluated by Dr. Amada Jupiter with neurology. Her initial CT was unremarkable. Because of prior aneurysm clipping she was unable to undergo MRI  ED Course: Vital Signs: BP 159/88   Pulse 88   Temp 97.7 F (36.5 C)   Resp 12   Wt 64.9 kg (143 lb 1.3 oz)   SpO2 99%   BMI 21.76 kg/m  CT of the head without contrast/code stroke: No acute intracranial abnormality. 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 Associated chronic encephalomalacia in the anterior right temporal and inferior right frontal lobes and anterior right basal ganglia. Lab data: Sodium 138, potassium 4.5, chloride 101, CO2 27, BUN 20,  creatinine 0.94, glucose 156, and in 10, LFTs normal except for mildly elevated total bilirubin 1.7, WBC 9000 with slightly elevated neutrophil count 82% but absolute neutrophils normal at 7.3%, coags normal Medications and treatments: None  Review of Systems:  In addition to the HPI above,  No Fever-chills, myalgias or other constitutional symptoms No Headache, changes with Vision or hearing, new weakness, tingling, numbness in any extremity, No problems swallowing food or Liquids, indigestion/reflux No Chest pain, Cough or Shortness of Breath, palpitations, orthopnea or DOE No Abdominal pain, N/V; no melena or hematochezia, no dark tarry stools No dysuria, hematuria or flank pain No new skin rashes, lesions, masses or bruises, No new joints pains-aches No recent weight gain or loss No polyuria, polydypsia or polyphagia,   Past Medical History:  Diagnosis Date  . Aneurysm (HCC)    s/p clips  . Glaucoma   . Hypotension   . Parkinson's disease (HCC)   . Sleep apnea     Past Surgical History:  Procedure Laterality Date  . aneurym clipping    . CRANIOTOMY      Social History   Social History  . Marital status: Married    Spouse name: N/A  . Number of children: N/A  . Years of education: N/A   Occupational History  . retired     Copy, Music therapist   Social History Main Topics  . Smoking status: Former Games developer  . Smokeless tobacco: Not on file     Comment: quit 40 years ago  . Alcohol use 4.2 oz/week  7 Glasses of wine per week     Comment: 1-2 glasses wine/day (6-8 oz in each glass)  . Drug use: No  . Sexual activity: Yes   Other Topics Concern  . Not on file   Social History Narrative  . No narrative on file    Mobility: Utilizes a cane and requires some assistance Work history: Not obtained   Allergies  Allergen Reactions  . Penicillins Rash    *childhood allergy*     Family History  Problem Relation Age of Onset  . Dementia Mother   .  Heart attack Father   . Kidney failure Father   . Stroke Brother   . Cancer Brother     Prostate  . Diabetes Brother   . Aneurysm Brother      Prior to Admission medications   Medication Sig Start Date End Date Taking? Authorizing Provider  AMBULATORY NON FORMULARY MEDICATION 1 Device by Does not apply route daily. Les Pou 10/24/15  Yes Rebecca S Tat, DO  aspirin 81 MG tablet Take 81 mg by mouth daily.   Yes Historical Provider, MD  bimatoprost (LUMIGAN) 0.01 % SOLN Place 1 drop into both eyes at bedtime.   Yes Historical Provider, MD  carbidopa-levodopa (SINEMET CR) 50-200 MG tablet TAKE 1 TABLET BY MOUTH AT BEDTIME 11/19/15  Yes Rebecca S Tat, DO  carbidopa-levodopa (SINEMET IR) 25-100 MG per tablet TAKE 2 TABLETS IN THE AM, 1 TABLET AT NOON, 1 IN THE EVENING Patient taking differently: Take 2 tablets by mouth in the morning then 2 tablets at noon then 2 tablets with dinner (evening meal) 01/22/15  Yes Rebecca S Tat, DO  diphenhydrAMINE (BENADRYL) 50 MG capsule Take 50 mg by mouth at bedtime.   Yes Historical Provider, MD  Droxidopa 300 MG CAPS Take 2 tablets three times a day Patient taking differently: Take 2 capsules by mouth 3 (three) times daily.  05/31/15  Yes Chrystie Nose, MD  fludrocortisone (FLORINEF) 0.1 MG tablet Take 2 tablets (0.2 mg total) by mouth daily. Patient taking differently: Take 0.1-0.2 mg by mouth See admin instructions. 0.2 mg in the morning and 0.1 mg five hours later 10/30/15  Yes Chrystie Nose, MD  fluticasone [facility]) 50 MCG/ACT nasal spray Place 2 sprays into both nostrils at bedtime as needed. 01/12/16  Yes Historical Provider, MD  LYSINE PO Take 1 tablet by mouth daily.    Yes Historical Provider, MD  Melatonin 5 MG TABS Take 5 mg by mouth at bedtime.   Yes Historical Provider, MD  MYRBETRIQ 25 MG TB24 tablet Take 25 mg by mouth at bedtime.  10/19/14  Yes Historical Provider, MD  Potassium Chloride ER 20 MEQ TBCR Take 20 mEq by mouth daily.  02/07/16   Yes Historical Provider, MD  pramipexole (MIRAPEX) 0.25 MG tablet Take 1 tablet (0.25 mg total) by mouth 3 (three) times daily. Patient taking differently: Take 0.125 mg by mouth at bedtime.  12/10/15  Yes Octaviano Batty Tat, DO    Physical Exam: Vitals:   02/19/16 1430 02/19/16 1500 02/19/16 1530 02/19/16 1530  BP: 165/88 170/97 159/88   Pulse: 86 86 88   Resp:  14 12   Temp:    97.7 F (36.5 C)  TempSrc:      SpO2: 98% 99% 99%   Weight:          Constitutional: NAD, calm, comfortable Eyes: PERRL, lids and conjunctivae normal ENMT: Mucous membranes are moist. Posterior pharynx clear of any  exudate or lesions.Normal dentition.  Neck: normal, supple, no masses, no thyromegaly Respiratory: clear to auscultation bilaterally, no wheezing, no crackles. Normal respiratory effort. No accessory muscle use.  Cardiovascular: Regular rate and rhythm, no murmurs / rubs / gallops. No extremity edema. 2+ pedal pulses. No carotid bruits.  Abdomen: no tenderness, no masses palpated. No hepatosplenomegaly. Bowel sounds positive.  Musculoskeletal: no clubbing / cyanosis. No joint deformity upper and lower extremities. Good ROM, no contractures. Normal muscle tone.  Skin: no rashes, lesions, ulcers. No induration Neurologic: CN 2-12 grossly intact. Sensation intact, DTR Diminished throughout Strength 3-4/5 x all 4 extremities. Previous aphasia has resolved Psychiatric: Normal judgment and insight. Alert and oriented x 3. Normal mood.    Labs on Admission: I have personally reviewed following labs and imaging studies  CBC:  Recent Labs Lab 02/19/16 1337 02/19/16 1340  WBC 9.0  --   NEUTROABS 7.3  --   HGB 14.0 14.6  HCT 41.7 43.0  MCV 90.5  --   PLT 231  --    Basic Metabolic Panel:  Recent Labs Lab 02/19/16 1337 02/19/16 1340  NA 138 138  K 4.5 4.4  CL 101 97*  CO2 27  --   GLUCOSE 156* 149*  BUN 20 22*  CREATININE 0.94 0.80  CALCIUM 9.7  --    GFR: Estimated Creatinine  Clearance: 67 mL/min (by C-G formula based on SCr of 0.8 mg/dL). Liver Function Tests:  Recent Labs Lab 02/19/16 1337  AST 21  ALT 5*  ALKPHOS 82  BILITOT 1.7*  PROT 6.6  ALBUMIN 4.3   No results for input(s): LIPASE, AMYLASE in the last 168 hours. No results for input(s): AMMONIA in the last 168 hours. Coagulation Profile:  Recent Labs Lab 02/19/16 1337  INR 1.05   Cardiac Enzymes: No results for input(s): CKTOTAL, CKMB, CKMBINDEX, TROPONINI in the last 168 hours. BNP (last 3 results) No results for input(s): PROBNP in the last 8760 hours. HbA1C: No results for input(s): HGBA1C in the last 72 hours. CBG: No results for input(s): GLUCAP in the last 168 hours. Lipid Profile: No results for input(s): CHOL, HDL, LDLCALC, TRIG, CHOLHDL, LDLDIRECT in the last 72 hours. Thyroid Function Tests: No results for input(s): TSH, T4TOTAL, FREET4, T3FREE, THYROIDAB in the last 72 hours. Anemia Panel: No results for input(s): VITAMINB12, FOLATE, FERRITIN, TIBC, IRON, RETICCTPCT in the last 72 hours. Urine analysis:    Component Value Date/Time   COLORURINE YELLOW 11/16/2015 1137   APPEARANCEUR CLEAR 11/16/2015 1137   LABSPEC 1.014 11/16/2015 1137   PHURINE 6.0 11/16/2015 1137   GLUCOSEU NEGATIVE 11/16/2015 1137   HGBUR NEGATIVE 11/16/2015 1137   BILIRUBINUR NEGATIVE 11/16/2015 1137   KETONESUR NEGATIVE 11/16/2015 1137   PROTEINUR NEGATIVE 11/16/2015 1137   UROBILINOGEN 0.2 05/10/2014 1108   NITRITE NEGATIVE 11/16/2015 1137   LEUKOCYTESUR MODERATE (A) 11/16/2015 1137   Sepsis Labs: @LABRCNTIP (procalcitonin:4,lacticidven:4) )No results found for this or any previous visit (from the past 240 hour(s)).   Radiological Exams on Admission: Ct Head Code Stroke W/o Cm  Result Date: 02/19/2016 CLINICAL DATA:  Code stroke.  Sudden onset aphasia. EXAM: CT HEAD WITHOUT CONTRAST TECHNIQUE: Contiguous axial images were obtained from the base of the skull through the vertex without  intravenous contrast. 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 Encephalomalacia in the anterior right temporal and inferior right frontal lobes and anterior right basal ganglia is unchanged. Ex vacuo  enlargement of the right frontal horn is unchanged. There is a chronic lacunar infarct in the left caudate head. There is no evidence of acute cortical infarct, intracranial hemorrhage, mass, midline shift, or extra-axial fluid collection. Orbits are unremarkable. Paranasal sinuses and mastoid air cells are clear. Calcified atherosclerosis is noted at the skullbase. ASPECTS [facility] Stroke Program Early CT Score) - Ganglionic level infarction (caudate, lentiform nuclei, internal capsule, insula, M1-M3 cortex): 7 - Supraganglionic infarction (M4-M6 cortex): 3 Total score (0-10 with 10 being normal): 10 IMPRESSION: 1. No evidence of acute intracranial abnormality. 2. ASPECTS is 10. 3. Unchanged encephalomalacia in the right cerebral hemisphere. Chronic left caudate lacunar infarct. These results were called by telephone at the time of interpretation on 02/19/2016 at 2:01 pm to Dr. Amada JupiterKirkpatrick, who verbally acknowledged these results. Electronically Signed   By: Sebastian AcheAllen  Grady M.D.   On: 02/19/2016 14:02    EKG: (Independently reviewed) Sinus rhythm with ventricular rate 87 bpm, QTC 444 ms, no acute ischemic changes  Assessment/Plan Principal Problem:   Aphasia -Presented with several hours of aphasia that persisted until the patient reached the ER; symptoms have now resolved -Differential includes TIA versus atypical seizure activity -Neurology has been consulted and we await their recommendations -We have utilized the ischemic stroke/TIA order set -Echocardiogram -Bilateral carotid duplex -Hemoglobin A1c and lipid panel -PT/OT/SLP evaluation -Full dose aspirin -Consider EEG -Unable to pursue MRI  secondary to prior aneurysmal clipping; await recommendations from neurology to determine if repeat CT scan indicated  Active Problems:   Akinetic rigid Parkinsons disease/Multiple system atrophy, Parkinson variant  -According to husband and patient patient is now back to her typical baseline -Continue preadmission carbidopa/levodopa and have asked pharmacy to assist in clarifying dosages and schedule -Continue Mirapex -Continue Myrbetriq for Parkinson's related bladder issues    Autonomic postural hypotension -Continue Florinef and Droxidopa    Depression      DVT prophylaxis: Lovenox Code Status: Full Family Communication: Husband at bedside Disposition Plan: Anticipate discharge back to preadmission home environment once medically stable Consults called: Neurology/Kirkpatrick     Dorsel Flinn L. ANP-BC [facility] Pager [phone]   If 7PM-7AM, please contact night-coverage [email or web address] Password [email or web address]  02/19/2016, 3:50 PM

## 2016-02-19 NOTE — ED Notes (Signed)
Revonda StandardAllison, NP informed patient has passed her swallow screen and this RN asked if the aspirin that was ordered as a suppository could be ordered PO. Junious Silkllison Ellis stated yes and she would change the order.

## 2016-02-19 NOTE — Progress Notes (Signed)
rn entered room to do q2 neuro checks. rn spoke with husband. Husband gave pt florinef and levodopa. rn educated him that we need to give the meds and I would take them to the pharmacy. bp taken 50/30. Pt unresponsive. Rapid response called and fluids started. md notified orders given. Will transfer pt to step down

## 2016-02-19 NOTE — Consult Note (Signed)
Neurology Consultation Reason for Consult: code stroke Referring Physician: Ardeen Jourdain   CC: Language difficulty  History is obtained from: husband.   HPI: Elizabeth Mcintosh is a 70 y.o. female last seen in her normal state around 8:30 - 9am. She was then found to be confused by her husband. 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 He brought her into emergency room, but by the time of their arrival, it was already outside the window for IV TPA. On arrival here, she was found to have a mild aphasia and right hemianopia   LKW: 8:30am tpa given?: no, outside IV TPA window Premorbid modified rankin scale: 3    ROS: A 14 point ROS was performed and is negative except as noted in the HPI.  Past Medical History:  Diagnosis Date  . Aneurysm (HCC)    s/p clips  . Glaucoma   . Hypotension   . Parkinson's disease (HCC)   . Sleep apnea      Family History  Problem Relation Age of Onset  . Dementia Mother   . Heart attack Father   . Kidney failure Father   . Stroke Brother   . Cancer Brother     Prostate  . Diabetes Brother   . Aneurysm Brother      Social History:  reports that she has quit smoking. She does not have any smokeless tobacco history on file. She reports that she drinks about 4.2 oz of alcohol per week . She reports that she does not use drugs.   Exam: Current vital signs: BP 152/93 (BP Location: Right Arm)   Temp 97.7 F (36.5 C) (Oral)   Resp 15   Wt 64.9 kg (143 lb 1.3 oz)   SpO2 97%   BMI 21.76 kg/m  Vital signs in last 24 hours: Temp:  [97.7 F (36.5 C)] 97.7 F (36.5 C) (09/12 1401) Resp:  [15] 15 (09/12 1401) BP: (152)/(93) 152/93 (09/12 1401) SpO2:  [97 %] 97 % (09/12 1401) Weight:  [64.9 kg (143 lb 1.3 oz)] 64.9 kg (143 lb 1.3 oz) (09/12 1403)   Physical Exam  Constitutional: Appears well-developed and well-nourished.  Psych: Affect appropriate to situation Eyes: No scleral injection HENT: No OP obstrucion Head:  Normocephalic.  Cardiovascular: Normal rate and regular rhythm.  Respiratory: Effort normal and breath sounds normal to anterior ascultation GI: Soft.  No distension. There is no tenderness.  Skin: WDI  Neuro: Mental Status: Patient is awake, alert, She has a mild to moderate aphasia but is able to answer many questions appropriately. She is able to follow commands readily. Cranial Nerves: II: Right hemianopia Pupils are equal, round, and reactive to light.   III,IV, VI: EOMI without ptosis or diploplia.  V: Facial sensation is symmetric to temperature VII: Facial movement is symmetric.  VIII: hearing is intact to voice X: Uvula elevates symmetrically XI: Shoulder shrug is symmetric. XII: tongue is midline without atrophy or fasciculations.  Motor: Tone is normal. Bulk is normal. 5/5 strength was present in all four extremities.  Sensory: Sensation is symmetric to light touch and temperature in the arms and legs. Cerebellar: FNF intact bilaterally      I have reviewed labs in epic and the results pertinent to this consultation are: CMP-unremarkable  I have reviewed the images obtained: CT head-unremarkable  Impression: 70 year old female with new hemianopia and mild aphasia. 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  Recommendations: 1. HgbA1c, fasting lipid panel 2. CTA head and neck.  3. Frequent neuro checks 4. Echocardiogram 5. Carotid dopplers 6. Prophylactic therapy-Antiplatelet med: Aspirin - dose 325mg  PO or 300mg  PR 7. Risk factor modification 8. Telemetry monitoring 9. PT consult, OT consult, Speech consult 10. please page stroke NP  Or  PA  Or MD  from 8am -4 pm starting 9/13  as this patient will be followed by the stroke team at this point.   You can look them up on [email or web address]   11. Continue home MSA meds.    Ritta SlotMcNeill Ovide Dusek, MD [facility] [phone]  If 7pm- 7am, please page  neurology on call as listed in AMION.

## 2016-02-19 NOTE — Code Documentation (Signed)
70 [facility].  Husband reports he found her on the floor this AM after she had gotten up and walked to the bathroom.  She was normal at that time around 0830.  He went downstairs to prepare breakfast and when he came back around 1030 she was having difficulty speaking.  She has a postural hypotension condition.  He spoke with the patients regular MD.  EMS was activated and she presented to the ED.  On arrival she is alert - having difficulty with word finding - she has some receptive and expressive aphasia - she can follow simple commands but not always accurately.  She has a right field cut.  Her NIHSS is 5 - missed questions - mild aphasia and right field cut.  Her LSW was 0830 - out of the window for tPA.  BP and CBG are WNL. Dr. Amada JupiterKirkpatrick at the bedside.  Speaking with patient and husband.  No acute treatment at this time.  Handoff to Lehman BrothersBarbara RN.

## 2016-02-19 NOTE — ED Notes (Signed)
Pt is in stable condition for admission to inpatient unit. Aphasia still present, did not know the month or her age, able to follow simple commands but not always accurate. Pt did pass her swallow screen. Husband is at bedside. Latest neuro check done by this RN at 1530.

## 2016-02-19 NOTE — Progress Notes (Signed)
   Pt w/ hypotensive unresponsive episode approximately 30 min after husband gave pt her florinef and carbidopa levodopa. Short lived. Pt never lost pulse. Rapid response was appropriately called. Pt started on fluid bolus w/ immediate response in BP and pt mentation quickly returning to baseline. Pt respiratory rate and pulse remained stable throughout event. Pt w/ similar episodes in past. No new focal deficit. Will transfer pt to SDU for closer monitoring and have asked that family not administer any medications. EKG and troponin ordered  Shelly Flattenavid Merrell, MD [facility] 02/19/2016, 6:18 PM

## 2016-02-19 NOTE — ED Triage Notes (Signed)
Pt last seen normal 1037 reported however husband states 0830-0900.  CODE STROKE called.  Protocol initiated.

## 2016-02-19 NOTE — Telephone Encounter (Signed)
Tried to call number provided x3 with no answer. This went straight to voicemail and voicemail not available. I also tried to call mobile number [phone] with no answer. I tried to call son Jonny RuizJohn at [phone] and daughter Cammy Copabigail [phone]. No answer on any phone. Patient does need evaluated in the ER if she calls back.

## 2016-02-19 NOTE — ED Notes (Signed)
Admitting provider at bedside.

## 2016-02-20 ENCOUNTER — Observation Stay (HOSPITAL_COMMUNITY): Payer: Medicare Other

## 2016-02-20 DIAGNOSIS — E785 Hyperlipidemia, unspecified: Secondary | ICD-10-CM | POA: Diagnosis not present

## 2016-02-20 DIAGNOSIS — G9340 Encephalopathy, unspecified: Secondary | ICD-10-CM | POA: Diagnosis not present

## 2016-02-20 DIAGNOSIS — I639 Cerebral infarction, unspecified: Secondary | ICD-10-CM | POA: Diagnosis not present

## 2016-02-20 DIAGNOSIS — R4701 Aphasia: Secondary | ICD-10-CM | POA: Diagnosis not present

## 2016-02-20 DIAGNOSIS — I951 Orthostatic hypotension: Secondary | ICD-10-CM | POA: Diagnosis not present

## 2016-02-20 DIAGNOSIS — G232 Striatonigral degeneration: Secondary | ICD-10-CM

## 2016-02-20 LAB — LIPID PANEL
CHOL/HDL RATIO: 3.6 ratio
Cholesterol: 189 mg/dL (ref 0–200)
HDL: 52 mg/dL (ref >40)
LDL CALC: 127 mg/dL — AB (ref 0–99)
Triglycerides: 51 mg/dL (ref <150)
VLDL: 10 mg/dL (ref 0–40)

## 2016-02-20 LAB — MRSA PCR SCREENING: MRSA BY PCR: NEGATIVE

## 2016-02-20 MED ORDER — CLOPIDOGREL BISULFATE 75 MG PO TABS
75.0000 mg | ORAL_TABLET | Freq: Every day | ORAL | Status: DC
  Administered 2016-02-21 – 2016-02-22 (×2): 75 mg via ORAL
  Filled 2016-02-20 (×2): qty 1

## 2016-02-20 MED ORDER — CARBIDOPA-LEVODOPA ER 50-200 MG PO TBCR
1.0000 | EXTENDED_RELEASE_TABLET | Freq: Every day | ORAL | Status: DC
  Administered 2016-02-20 – 2016-02-21 (×2): 1 via ORAL
  Filled 2016-02-20 (×3): qty 1

## 2016-02-20 MED ORDER — CARBIDOPA-LEVODOPA 25-100 MG PO TABS
2.0000 | ORAL_TABLET | Freq: Three times a day (TID) | ORAL | Status: DC
  Administered 2016-02-20 – 2016-02-22 (×8): 2 via ORAL
  Filled 2016-02-20 (×8): qty 2

## 2016-02-20 MED ORDER — ATORVASTATIN CALCIUM 20 MG PO TABS
20.0000 mg | ORAL_TABLET | Freq: Every day | ORAL | Status: DC
  Administered 2016-02-20 – 2016-02-21 (×2): 20 mg via ORAL
  Filled 2016-02-20 (×2): qty 1

## 2016-02-20 NOTE — Care Management Obs Status (Signed)
MEDICARE OBSERVATION STATUS NOTIFICATION   Patient Details  Name: Elizabeth Mcintosh MRN: [id number] Date of Birth: 01/08/1946   Medicare Observation Status Notification Given:  Yes    Leone Havenaylor, Zianna Dercole Clinton, RN 02/20/2016, 12:22 PM

## 2016-02-20 NOTE — Progress Notes (Signed)
EEG completed, results pending. 

## 2016-02-20 NOTE — Progress Notes (Signed)
PT Cancellation Note  Patient Details Name: Elizabeth Mcintosh MRN: [id number] DOB: 12/03/1945   Cancelled Treatment:    Reason Eval/Treat Not Completed: Patient at procedure or test/unavailable.  PT to check back later as time allows.   Thanks,    Rollene Rotundaebecca B. Treyvin Glidden, PT, DPT [phone]   02/20/2016, 10:29 AM

## 2016-02-20 NOTE — Progress Notes (Signed)
Addendum  Notified by RN regarding patient's worsening confusion. Husband, Elizabeth Mcintosh, is at bedside. I went to evaluate the patient. She had had confusion prior to admission, which improved this morning on my exam. Then about 1.5 hours ago, she started saying nonsensical statements. On my exam, she is oriented to self, place, and president but not the date. She is able to remember me from our meeting this morning as well as meeting one of my colleagues and her friend as well. She has no complaints. I suspect if patient's confusion is intermittent in nature as Dr. Roda ShuttersXu has mentioned in his note that she was not oriented this afternoon on his exam. Differentials include progression of her underlying Parkinsons/MSA, TIA. Continue to monitor. Repeat CT head is pending in the morning.    Elizabeth StainJennifer Ameilia Rattan, DO [facility] Pager [phone]  If 7PM-7AM, please contact night-coverage [email or web address] Password [facility] 02/20/2016, 6:27 PM

## 2016-02-20 NOTE — Progress Notes (Addendum)
PROGRESS NOTE    Elizabeth Mcintosh  ZOX:[id number] DOB: 04/16/1946 DOA: 02/19/2016 PCP: Gwen PoundsUSSO,[name], MD     Brief Narrative:  Elizabeth Mcintosh is a 70 y.o. 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 She was evaluated by neurology in the ED and was admitted for stroke work up. Due to her prior aneurysm clipping, she is unable to undergo MRI. Patient was transferred to stepdown unit after hypotensive and unresponsive episode following medication administration by her husband (florinef and carbidopa). Rapid response was called and patient's BP improved with fluid bolus.   Assessment & Plan:   Principal Problem:   Aphasia Active Problems:   Akinetic rigid Parkinsons disease   Depression   Autonomic postural hypotension   Multiple system atrophy, Parkinson variant (HCC)   Cerebral infarction due to unspecified mechanism     Aphasia -TIA vs seizure  -Neurology/Stroke team consulted and following  -Hemoglobin A1c pending  -Echocardiogram pending  -PT/OT/SLP evaluation -EEG pending  -Neurochecks  -CTA head and neck completed  -Aspirin daily   Akinetic rigid Parkinsons disease/Multiple system atrophy, Parkinson variant  -Continue carbidopa/levodopa  -Continue Mirapex -Continue Myrbetriq for Parkinson's related bladder issues  Autonomic postural hypotension -Continue Florinef and Droxidopa   DVT prophylaxis: Lovenox Code Status: Full Family Communication: Spoke with husband, Leotis ShamesJeffery, over the phone this morning Disposition Plan: Will maintain observation status for now. Pending remaining work up today and neurology recommendations. PT/OT/SLP. [facility]   Consultants:   Neurology   Procedures:   None  Antimicrobials:   None     Subjective: Patient is able to give me  some history. She states that due to her medical diseases, she is quite weak at baseline. She only walks minimally at home and is dependent on a wheelchair. Her husband and another caregiver help her at home. She states that she falls a lot at home. She also states that yesterday, she had an episode where she could not get words out as she wanted to. She states that this has mostly resolved, although not quite back to baseline. She denies any focal, new weakness or deficits. She denies any fevers, chest pain, shortness of breath, nausea, vomiting, diarrhea, abdominal pain, dysuria.  Objective: Vitals:   02/19/16 2334 02/20/16 0311 02/20/16 0759 02/20/16 0800  BP: 130/62 (!) 107/54 140/73 126/73  Pulse: 88 81 94 95  Resp: 15 17 19 18   Temp: 98.1 F (36.7 C) 98 F (36.7 C) 98.5 F (36.9 C)   TempSrc: Oral Oral Oral   SpO2: 95% 93% 94% 92%  Weight:       No intake or output data in the 24 hours ending 02/20/16 0854 Filed Weights   02/19/16 1300 02/19/16 1403  Weight: 64.9 kg (143 lb 1.3 oz) 64.9 kg (143 lb 1.3 oz)    Examination:  General exam: Appears calm and comfortable, resting in bed  Respiratory system: Clear to auscultation. Respiratory effort normal. Cardiovascular system: S1 & S2 heard, RRR. No JVD, murmurs, rubs, gallops or clicks. No pedal edema. Gastrointestinal system: Abdomen is nondistended, soft and nontender. No organomegaly or masses felt. Normal bowel sounds heard. Central nervous system: Alert and oriented. No focal neurological deficits. No gross CN 2-12 deficits.  Extremities: Symmetric 3/5 all extremities, able to move all extremities spontaneously, hand grip strong and equal  Skin: No rashes, lesions or  ulcers Psychiatry: Judgement and insight appear normal. Mood & affect appropriate.   Data Reviewed: I have personally reviewed following labs and imaging studies  CBC:  Recent Labs Lab 02/19/16 1337 02/19/16 1340  WBC 9.0  --   NEUTROABS 7.3  --   HGB  14.0 14.6  HCT 41.7 43.0  MCV 90.5  --   PLT 231  --    Basic Metabolic Panel:  Recent Labs Lab 02/19/16 1337 02/19/16 1340  NA 138 138  K 4.5 4.4  CL 101 97*  CO2 27  --   GLUCOSE 156* 149*  BUN 20 22*  CREATININE 0.94 0.80  CALCIUM 9.7  --    GFR: Estimated Creatinine Clearance: 67 mL/min (by C-G formula based on SCr of 0.8 mg/dL). Liver Function Tests:  Recent Labs Lab 02/19/16 1337  AST 21  ALT 5*  ALKPHOS 82  BILITOT 1.7*  PROT 6.6  ALBUMIN 4.3   No results for input(s): LIPASE, AMYLASE in the last 168 hours. No results for input(s): AMMONIA in the last 168 hours. Coagulation Profile:  Recent Labs Lab 02/19/16 1337  INR 1.05   Cardiac Enzymes:  Recent Labs Lab 02/19/16 2009  TROPONINI <0.03   BNP (last 3 results) No results for input(s): PROBNP in the last 8760 hours. HbA1C: No results for input(s): HGBA1C in the last 72 hours. CBG: No results for input(s): GLUCAP in the last 168 hours. Lipid Profile:  Recent Labs  02/20/16 0324  CHOL 189  HDL 52  LDLCALC 127*  TRIG 51  CHOLHDL 3.6   Thyroid Function Tests: No results for input(s): TSH, T4TOTAL, FREET4, T3FREE, THYROIDAB in the last 72 hours. Anemia Panel: No results for input(s): VITAMINB12, FOLATE, FERRITIN, TIBC, IRON, RETICCTPCT in the last 72 hours. Sepsis Labs: No results for input(s): PROCALCITON, LATICACIDVEN in the last 168 hours.  No results found for this or any previous visit (from the past 240 hour(s)).       Radiology Studies: Ct Angio Head W Or Wo Contrast  Result Date: 02/19/2016 CLINICAL DATA:  Only done with were interested gland with a with a of gap about the there are out flattened of the sella at the end there 5 months images a we keep at the 7 -with foot could mass EXAM: CT ANGIOGRAPHY HEAD AND NECK TECHNIQUE: Multidetector CT imaging of the head and neck was performed using the standard protocol during bolus administration of intravenous contrast.  Multiplanar CT image reconstructions and MIPs were obtained to evaluate the vascular anatomy. Carotid stenosis measurements (when applicable) are obtained utilizing NASCET criteria, using the distal internal carotid diameter as the denominator. CONTRAST:  50 cc of Isovue COMPARISON:  Prior CT from earlier the same day as well was prior CT 11/13/2014. FINDINGS: CTA NECK Aortic arch: Visualized aortic arch of normal caliber with normal 3 vessel morphology. Mild atheromatous plaque within the aortic arch in at the origin of the great vessels without flow limiting stenosis. Visualized subclavian arteries are widely patent. Right carotid system: Right common carotid artery patent from its origin to the bifurcation. Mild atheromatous plaque about the right bifurcation with associated narrowing of up to 20% by NASCET criteria. This has mildly progressed from previous. Right ICA patent distally to the skullbase without stenosis, dissection, or occlusion. Right external carotid artery and its branches within normal limits. Left carotid system: Left common carotid artery patent from its origin to the bifurcation. Minimal a centric plaque about the left bifurcation without stenosis. Left ICA patent  distally without stenosis, dissection, or occlusion. Left external carotid artery and its branches within normal limits. Vertebral arteries:Both vertebral arteries arise from the subclavian arteries. Vertebral arteries are largely code dominant. Atheromatous plaque at the origin of the right vertebral artery with approximately 50% narrowing. Vertebral arteries otherwise widely patent within the neck without stenosis, dissection, or occlusion. Skeleton: No acute osseous abnormality. No worrisome lytic or blastic osseous lesions. Other neck: Mild atelectatic changes within the visualized lungs. Visualized lungs are otherwise clear. Mediastinum within normal limits. Thyroid normal. No adenopathy within the neck. No acute soft tissue  abnormality. CTA HEAD Anterior circulation: Petrous segments patent bilaterally without stenosis. Multifocal atheromatous plaque within the cavernous ICAs bilaterally, slightly worse on the right. There is associated moderate multi focal narrowing of up to 50%, most prevalent at the supraclinoid right ICA. A1 segments are patent. Surgical clip present at the right aspect of the anterior communicating artery complex. No residual aneurysm. Anterior cerebral arteries patent distally but demonstrate multifocal atheromatous irregularity, similar to previous. There is a superimposed relatively severe stenosis at the proximal left A2 segment (series 10, image 23). Moderate narrowing of approximately 50% within the proximal right M1 segment. Right M1 segment patent distally. Right MCA bifurcation normal. No proximal M2 occlusion. Right MCA branches opacified to their distal aspects. Left M1 segment mildly irregular without significant stenosis. Left MCA bifurcation normal. Distal left MCA branches well opacified to their distal aspects. Posterior circulation: Vertebral arteries patent to the vertebrobasilar junction. Mild atheromatous irregularity in the distal right V4 segment without significant stenosis. Basilar artery widely patent. Superior cerebral arteries patent bilaterally. Both of the posterior cerebral arteries arise from the basilar artery. Left PCA well opacified and patent to its distal aspect. There is a severe short-segment stenosis right P2 segment (series 8, image 116). This is similar to previous. Venous sinuses: Contents. Anatomic variants: No significant anatomic variant. No new aneurysm identified. Delayed phase: No pathologic enhancement. IMPRESSION: CTA NECK IMPRESSION: 1. Mild atherosclerotic changes within the aortic arch and carotid bifurcations bilaterally without high-grade or significant stenosis. Irregularity greatest at the right carotid bifurcation were there is associated 20% narrowing by  NASCET criteria, mildly progressed relative to 2016. 2. Atheromatous plaque at the origin of the right vertebral artery with approximately 50% stenosis. Vertebral arteries otherwise widely patent within the neck. CTA HEAD IMPRESSION: 1. Atheromatous plaque throughout the anterior circulation, overall relatively similar to previous exam. Changes greatest within the carotid siphons, proximal right right M1 segment, and anterior cerebral arteries. Narrowing at the supraclinoid right ICA estimated at 50% with additional 50% stenosis proximal right M1 segment. Short-segment severe proximal left A2 stenosis. 2. Short segment severe right P2 stenosis, similar. No other high-grade stenosis within the posterior circulation. 3. Sequelae of prior aneurysm clipping and/or coiling at the right anterior communicating artery complex. No evidence for recurrent aneurysm. Electronically Signed   By: Rise Mu M.D.   On: 02/19/2016 21:56   Ct Angio Neck W Or Wo Contrast  Result Date: 02/19/2016 CLINICAL DATA:  Only done with were interested gland with a with a of gap about the there are out flattened of the sella at the end there 5 months images a we keep at the 7 -with foot could mass EXAM: CT ANGIOGRAPHY HEAD AND NECK TECHNIQUE: Multidetector CT imaging of the head and neck was performed using the standard protocol during bolus administration of intravenous contrast. Multiplanar CT image reconstructions and MIPs were obtained to evaluate the vascular anatomy. Carotid stenosis measurements (  when applicable) are obtained utilizing NASCET criteria, using the distal internal carotid diameter as the denominator. CONTRAST:  50 cc of Isovue COMPARISON:  Prior CT from earlier the same day as well was prior CT 11/13/2014. FINDINGS: CTA NECK Aortic arch: Visualized aortic arch of normal caliber with normal 3 vessel morphology. Mild atheromatous plaque within the aortic arch in at the origin of the great vessels without flow  limiting stenosis. Visualized subclavian arteries are widely patent. Right carotid system: Right common carotid artery patent from its origin to the bifurcation. Mild atheromatous plaque about the right bifurcation with associated narrowing of up to 20% by NASCET criteria. This has mildly progressed from previous. Right ICA patent distally to the skullbase without stenosis, dissection, or occlusion. Right external carotid artery and its branches within normal limits. Left carotid system: Left common carotid artery patent from its origin to the bifurcation. Minimal a centric plaque about the left bifurcation without stenosis. Left ICA patent distally without stenosis, dissection, or occlusion. Left external carotid artery and its branches within normal limits. Vertebral arteries:Both vertebral arteries arise from the subclavian arteries. Vertebral arteries are largely code dominant. Atheromatous plaque at the origin of the right vertebral artery with approximately 50% narrowing. Vertebral arteries otherwise widely patent within the neck without stenosis, dissection, or occlusion. Skeleton: No acute osseous abnormality. No worrisome lytic or blastic osseous lesions. Other neck: Mild atelectatic changes within the visualized lungs. Visualized lungs are otherwise clear. Mediastinum within normal limits. Thyroid normal. No adenopathy within the neck. No acute soft tissue abnormality. CTA HEAD Anterior circulation: Petrous segments patent bilaterally without stenosis. Multifocal atheromatous plaque within the cavernous ICAs bilaterally, slightly worse on the right. There is associated moderate multi focal narrowing of up to 50%, most prevalent at the supraclinoid right ICA. A1 segments are patent. Surgical clip present at the right aspect of the anterior communicating artery complex. No residual aneurysm. Anterior cerebral arteries patent distally but demonstrate multifocal atheromatous irregularity, similar to previous.  There is a superimposed relatively severe stenosis at the proximal left A2 segment (series 10, image 23). Moderate narrowing of approximately 50% within the proximal right M1 segment. Right M1 segment patent distally. Right MCA bifurcation normal. No proximal M2 occlusion. Right MCA branches opacified to their distal aspects. Left M1 segment mildly irregular without significant stenosis. Left MCA bifurcation normal. Distal left MCA branches well opacified to their distal aspects. Posterior circulation: Vertebral arteries patent to the vertebrobasilar junction. Mild atheromatous irregularity in the distal right V4 segment without significant stenosis. Basilar artery widely patent. Superior cerebral arteries patent bilaterally. Both of the posterior cerebral arteries arise from the basilar artery. Left PCA well opacified and patent to its distal aspect. There is a severe short-segment stenosis right P2 segment (series 8, image 116). This is similar to previous. Venous sinuses: Contents. Anatomic variants: No significant anatomic variant. No new aneurysm identified. Delayed phase: No pathologic enhancement. IMPRESSION: CTA NECK IMPRESSION: 1. Mild atherosclerotic changes within the aortic arch and carotid bifurcations bilaterally without high-grade or significant stenosis. Irregularity greatest at the right carotid bifurcation were there is associated 20% narrowing by NASCET criteria, mildly progressed relative to 2016. 2. Atheromatous plaque at the origin of the right vertebral artery with approximately 50% stenosis. Vertebral arteries otherwise widely patent within the neck. CTA HEAD IMPRESSION: 1. Atheromatous plaque throughout the anterior circulation, overall relatively similar to previous exam. Changes greatest within the carotid siphons, proximal right right M1 segment, and anterior cerebral arteries. Narrowing at the supraclinoid right ICA  estimated at 50% with additional 50% stenosis proximal right M1 segment.  Short-segment severe proximal left A2 stenosis. 2. Short segment severe right P2 stenosis, similar. No other high-grade stenosis within the posterior circulation. 3. Sequelae of prior aneurysm clipping and/or coiling at the right anterior communicating artery complex. No evidence for recurrent aneurysm. Electronically Signed   By: Rise Mu M.D.   On: 02/19/2016 21:56   Ct Head Code Stroke W/o Cm  Result Date: 02/19/2016 CLINICAL DATA:  Code stroke.  Sudden onset aphasia. EXAM: CT HEAD WITHOUT CONTRAST TECHNIQUE: Contiguous axial images were obtained from the base of the skull through the vertex without intravenous contrast. 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 Encephalomalacia in the anterior right temporal and inferior right frontal lobes and anterior right basal ganglia is unchanged. Ex vacuo enlargement of the right frontal horn is unchanged. There is a chronic lacunar infarct in the left caudate head. There is no evidence of acute cortical infarct, intracranial hemorrhage, mass, midline shift, or extra-axial fluid collection. Orbits are unremarkable. Paranasal sinuses and mastoid air cells are clear. Calcified atherosclerosis is noted at the skullbase. ASPECTS [facility] Stroke [facility] Early CT Score) - Ganglionic level infarction (caudate, lentiform nuclei, internal capsule, insula, M1-M3 cortex): 7 - Supraganglionic infarction (M4-M6 cortex): 3 Total score (0-10 with 10 being normal): 10 IMPRESSION: 1. No evidence of acute intracranial abnormality. 2. ASPECTS is 10. 3. Unchanged encephalomalacia in the right cerebral hemisphere. Chronic left caudate lacunar infarct. These results were called by telephone at the time of interpretation on 02/19/2016 at 2:01 pm to Dr. Amada Jupiter, who verbally acknowledged these results. Electronically Signed   By: Sebastian Ache M.D.   On: 02/19/2016  14:02        Scheduled Meds: . aspirin EC  325 mg Oral Daily  . carbidopa-levodopa  1 tablet Oral QHS  . carbidopa-levodopa  2 tablet Oral TID PC  . Droxidopa  600 mg Oral TID  . enoxaparin (LOVENOX) injection  40 mg Subcutaneous Q24H  . fludrocortisone  0.2 mg Oral Daily  . latanoprost  1 drop Both Eyes QHS  . mirabegron ER  25 mg Oral Daily  . pramipexole  0.25 mg Oral TID  . sodium chloride  1,000 mL Intravenous Once   Continuous Infusions:    LOS: 0 days    Time spent: 40 minutes     Noralee Stain, DO [facility] Pager [phone]  If 7PM-7AM, please contact night-coverage [email or web address] Password [facility] 02/20/2016, 8:54 AM

## 2016-02-20 NOTE — Progress Notes (Signed)
STROKE TEAM PROGRESS NOTE   HPI  Elizabeth Mcintosh is a 70 y.o. female last seen in her normal state around 8:30 - 9am. She was then found to be confused by her husband. 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 He brought her into emergency room, but by the time of their arrival, it was already outside the window for IV TPA. On arrival here, she was found to have a mild aphasia and right hemianopia  LKW: 8:30am tpa given?: no, outside IV TPA window Premorbid modified rankin scale: 3  SUBJECTIVE (INTERVAL HISTORY) No family is at the bedside.  Overall she feels her condition is stable. She is awake alert but not orientated. Slow in movement especially difficulty with initiation of actions. However, there is no hemianopia or other focal neurological deficit on my exam.    OBJECTIVE Temp:  [98 F (36.7 C)-99.5 F (37.5 C)] 99.5 F (37.5 C) (09/13 1524) Pulse Rate:  [78-95] 89 (09/13 1524) Cardiac Rhythm: Normal sinus rhythm (09/13 1524) Resp:  [11-22] 17 (09/13 1524) BP: (50-201)/(30-101) 114/60 (09/13 1524) SpO2:  [92 %-99 %] 93 % (09/13 1524)  No results for input(s): GLUCAP in the last 168 hours.  Recent Labs Lab 02/19/16 1337 02/19/16 1340  NA 138 138  K 4.5 4.4  CL 101 97*  CO2 27  --   GLUCOSE 156* 149*  BUN 20 22*  CREATININE 0.94 0.80  CALCIUM 9.7  --     Recent Labs Lab 02/19/16 1337  AST 21  ALT 5*  ALKPHOS 82  BILITOT 1.7*  PROT 6.6  ALBUMIN 4.3    Recent Labs Lab 02/19/16 1337 02/19/16 1340  WBC 9.0  --   NEUTROABS 7.3  --   HGB 14.0 14.6  HCT 41.7 43.0  MCV 90.5  --   PLT 231  --     Recent Labs Lab 02/19/16 2009  TROPONINI <0.03    Recent Labs  02/19/16 1337  LABPROT 13.7  INR 1.05   No results for input(s): COLORURINE, LABSPEC, PHURINE, GLUCOSEU, HGBUR, BILIRUBINUR, KETONESUR, PROTEINUR, UROBILINOGEN, NITRITE, LEUKOCYTESUR in the last 72 hours.  Invalid input(s): APPERANCEUR     Component Value Date/Time   CHOL  189 02/20/2016 0324   TRIG 51 02/20/2016 0324   HDL 52 02/20/2016 0324   CHOLHDL 3.6 02/20/2016 0324   VLDL 10 02/20/2016 0324   LDLCALC 127 (H) 02/20/2016 0324   No results found for: HGBA1C No results found for: LABOPIA, COCAINSCRNUR, LABBENZ, AMPHETMU, THCU, LABBARB  No results for input(s): ETH in the last 168 hours.  I have personally reviewed the radiological images below and agree with the radiology interpretations.  Ct Angio Head W Or Wo Contrast 02/19/2016 IMPRESSION: CTA NECK IMPRESSION: 1. Mild atherosclerotic changes within the aortic arch and carotid bifurcations bilaterally without high-grade or significant stenosis. Irregularity greatest at the right carotid bifurcation were there is associated 20% narrowing by NASCET criteria, mildly progressed relative to 2016. 2. Atheromatous plaque at the origin of the right vertebral artery with approximately 50% stenosis. Vertebral arteries otherwise widely patent within the neck. CTA HEAD IMPRESSION: 1. Atheromatous plaque throughout the anterior circulation, overall relatively similar to previous exam. Changes greatest within the carotid siphons, proximal right right M1 segment, and anterior cerebral arteries. Narrowing at the supraclinoid right ICA estimated at 50% with additional 50% stenosis proximal right M1 segment. Short-segment severe proximal left A2 stenosis. 2. Short segment severe right P2 stenosis, similar. No other  high-grade stenosis within the posterior circulation. 3. Sequelae of prior aneurysm clipping and/or coiling at the right anterior communicating artery complex. No evidence for recurrent aneurysm.   Ct Head Code Stroke W/o Cm 02/19/2016 IMPRESSION: 1. No evidence of acute intracranial abnormality. 2. ASPECTS is 10. 3. Unchanged encephalomalacia in the right cerebral hemisphere. Chronic left caudate lacunar infarct.   2D Echocardiogram  pending  Repeat CT head - pending  EEG - pending   PHYSICAL EXAM  Temp:   [98 F (36.7 C)-99.5 F (37.5 C)] 99.5 F (37.5 C) (09/13 1524) Pulse Rate:  [78-95] 89 (09/13 1524) Resp:  [11-22] 17 (09/13 1524) BP: (50-201)/(30-101) 114/60 (09/13 1524) SpO2:  [92 %-99 %] 93 % (09/13 1524)  General - Well nourished, well developed, in no apparent distress.  Ophthalmologic - fundi not visualized due to incorporation.  Cardiovascular - Regular rate and rhythm with no murmur.  Mental Status -  Awake, alert, orientated to people, but not orientated to place, time. Language including expression, naming, repetition, comprehension was assessed and found intact, however, significant psychomotor slowing Fund of Knowledge was assessed and know current president.  Cranial Nerves II - XII - II - Visual field intact OU. III, IV, VI - Extraocular movements intact. V - Facial sensation intact bilaterally. VII - Facial movement intact bilaterally. VIII - Hearing & vestibular intact bilaterally. X - Palate elevates symmetrically. XI - Chin turning & shoulder shrug intact bilaterally. XII - Tongue protrusion intact.  Motor Strength - The patient's strength was symmetrical in all extremities and pronator drift was absent. However, significant bradykinesia bilaterally, especially on initiation of movement. Bulk was normal and fasciculations were absent.   Motor Tone - Muscle tone was assessed at the neck and appendages and was normal except right LE the increased muscle tone and cogwheeling.  Reflexes - The patient's reflexes were 1+ in all extremities and she had no pathological reflexes.  Sensory - Light touch, temperature/pinprick were assessed and were symmetrical.    Coordination - The patient had normal movements in the hands with no ataxia or dysmetria, however significant bradykinesia. Right lower extremity mild cogwheeling present. Tremor was absent.  Gait and Station - not tested due to safety concerns.   ASSESSMENT/PLAN Elizabeth Mcintosh is a 70 y.o. female with  history of MSA, syncope/near syncope, orthostatic hypotension, ACOM aneurysm s/p clipping in 1982 admitted for confusion. Symptoms stable.    TIA vs. encephalopathy:  Initial concerning for right hemianopia, however not present on today's exam. No focal neurological deficit. Not fully orientated.  MRI  not able to perform due to old aneurysm clip  MRA  not able to perform due to old aneurysm clip  CTA head and neck - atherosclerosis at right cavernous ICA, right MCA, left A2, right P2  2D Echo  pending  EEG pending  Repeat CT head in am   LDL 127  HgbA1c pending  Lovenox for VTE prophylaxis  Diet regular Room service appropriate? Yes; Fluid consistency: Thin   aspirin 81 mg daily prior to admission, now on clopidogrel 75 mg daily. Continue Plavix on discharge  Patient counseled to be compliant with her antithrombotic medications  Ongoing aggressive stroke risk factor management  Therapy recommendations:  Pending  Disposition:  Pending  MSA with orthostatic hypotension  Following with Dr. Arbutus Leas at [facility]  On Sinemet and Mirapex  On droxidopa and florinef   Continue follow-up with Dr. Arbutus Leas Permissive hypertension (OK if <220/120) for 24-48 hours post stroke and  then gradually normalized within 5-7 days.  Hyperlipidemia  Home meds:  None   LDL 127, goal < 70  Add Lipitor 20 mg  Continue statin at discharge  Other Stroke Risk Factors  Advanced age  Intracranial multifocal arterial stenosis  Other Active Problems  Hyperglycemia  Other Pertinent History  ACOM aneurysm s/p clipping in [facility] day # 0   Marvel Plan, MD PhD Stroke Neurology 02/20/2016 3:48 PM    To contact Stroke Continuity provider, please refer to [email or web address]. After hours, contact General Neurology

## 2016-02-20 NOTE — Progress Notes (Signed)
SLP Cancellation Note  Patient Details Name: Elizabeth Mcintosh MRN: [id number] DOB: 07/26/1945   Cancelled treatment:       Reason Eval/Treat Not Completed: Patient at procedure or test/unavailable  Tollie EthHaleigh Ragan Kianna Billet, Student SLP  Caryl NeverHaleigh R Biff Rutigliano 02/20/2016, 10:17 AM

## 2016-02-20 NOTE — Progress Notes (Signed)
Notified dr Alvino Chapelchoi regarding pt's family stating that pt is more confused, sentences not making any sense. Vital signs stable, follows commands as previous. Will come and assess pt.

## 2016-02-20 NOTE — Care Management Note (Addendum)
Case Management Note  Patient Details  Name: Elizabeth Mcintosh MRN: [id number] Date of Birth: 09/16/1945  Subjective/Objective:   Patient lives with spouse, presents with  hypotention and aphasia she has history of intracranial aneurysm s/p clipping unable to do MRI, and parkinson's, ct was negative, she is back to baseline per spouse.  She has Dr Timothy Lassousso as PCP, she has medication coverage, and she has transportation at discharge.  She has a cane which she uses at home and a w/chair, rolling waler and a BSC.  She has private duty aides M-W- F from 9 am - 12 noon.  Spouse is there also, he states they may have to increase  The aides hours.  NCM informed spouse and daughter that pt and ot will see patient and make recommendations also.  Await pt/ot eval.                  Action/Plan:   Expected Discharge Date:                  Expected Discharge Plan:  Home w Home Health Services  In-House Referral:     Discharge planning Services  CM Consult  Post Acute Care Choice:    Choice offered to:     DME Arranged:    DME Agency:     HH Arranged:    HH Agency:     Status of Service:  In process, will continue to follow  If discussed at Long Length of Stay Meetings, dates discussed:    Additional Comments:  Leone Havenaylor, Lennon Richins [place], RN 02/20/2016, 12:06 PM

## 2016-02-21 ENCOUNTER — Observation Stay (HOSPITAL_COMMUNITY): Payer: Medicare Other

## 2016-02-21 ENCOUNTER — Observation Stay (HOSPITAL_BASED_OUTPATIENT_CLINIC_OR_DEPARTMENT_OTHER): Payer: Medicare Other

## 2016-02-21 DIAGNOSIS — G9340 Encephalopathy, unspecified: Secondary | ICD-10-CM | POA: Diagnosis not present

## 2016-02-21 DIAGNOSIS — R4701 Aphasia: Secondary | ICD-10-CM | POA: Diagnosis not present

## 2016-02-21 DIAGNOSIS — G459 Transient cerebral ischemic attack, unspecified: Secondary | ICD-10-CM

## 2016-02-21 DIAGNOSIS — I951 Orthostatic hypotension: Secondary | ICD-10-CM | POA: Diagnosis not present

## 2016-02-21 DIAGNOSIS — I639 Cerebral infarction, unspecified: Secondary | ICD-10-CM | POA: Diagnosis not present

## 2016-02-21 DIAGNOSIS — G232 Striatonigral degeneration: Secondary | ICD-10-CM | POA: Diagnosis not present

## 2016-02-21 DIAGNOSIS — E785 Hyperlipidemia, unspecified: Secondary | ICD-10-CM | POA: Diagnosis not present

## 2016-02-21 LAB — URINALYSIS, ROUTINE W REFLEX MICROSCOPIC
BILIRUBIN URINE: NEGATIVE
GLUCOSE, UA: NEGATIVE mg/dL
KETONES UR: 15 mg/dL — AB
NITRITE: NEGATIVE
PH: 6 (ref 5.0–8.0)
Protein, ur: 30 mg/dL — AB
SPECIFIC GRAVITY, URINE: 1.023 (ref 1.005–1.030)

## 2016-02-21 LAB — BASIC METABOLIC PANEL
Anion gap: 8 (ref 5–15)
BUN: 20 mg/dL (ref 6–20)
CALCIUM: 9 mg/dL (ref 8.9–10.3)
CHLORIDE: 103 mmol/L (ref 101–111)
CO2: 26 mmol/L (ref 22–32)
CREATININE: 0.81 mg/dL (ref 0.44–1.00)
GFR calc non Af Amer: >60 mL/min (ref >60)
GLUCOSE: 101 mg/dL — AB (ref 65–99)
Potassium: 4 mmol/L (ref 3.5–5.1)
Sodium: 137 mmol/L (ref 135–145)

## 2016-02-21 LAB — CBC WITH DIFFERENTIAL/PLATELET
BASOS PCT: 0 %
Basophils Absolute: 0 10*3/uL (ref 0.0–0.1)
Eosinophils Absolute: 0.1 10*3/uL (ref 0.0–0.7)
Eosinophils Relative: 1 %
HEMATOCRIT: 37.9 % (ref 36.0–46.0)
Hemoglobin: 12.4 g/dL (ref 12.0–15.0)
LYMPHS ABS: 1 10*3/uL (ref 0.7–4.0)
Lymphocytes Relative: 12 %
MCH: 30.4 pg (ref 26.0–34.0)
MCHC: 32.7 g/dL (ref 30.0–36.0)
MCV: 92.9 fL (ref 78.0–100.0)
MONO ABS: 0.9 10*3/uL (ref 0.1–1.0)
MONOS PCT: 10 %
NEUTROS ABS: 6.7 10*3/uL (ref 1.7–7.7)
Neutrophils Relative %: 77 %
Platelets: 205 10*3/uL (ref 150–400)
RBC: 4.08 MIL/uL (ref 3.87–5.11)
RDW: 12.7 % (ref 11.5–15.5)
WBC: 8.6 10*3/uL (ref 4.0–10.5)

## 2016-02-21 LAB — URINE MICROSCOPIC-ADD ON

## 2016-02-21 LAB — HEMOGLOBIN A1C
HEMOGLOBIN A1C: 5.4 % (ref 4.8–5.6)
MEAN PLASMA GLUCOSE: 108 mg/dL

## 2016-02-21 LAB — ECHOCARDIOGRAM COMPLETE: WEIGHTICAEL: 2289.26 [oz_av]

## 2016-02-21 MED ORDER — FLUDROCORTISONE ACETATE 0.1 MG PO TABS
0.1000 mg | ORAL_TABLET | ORAL | Status: DC
  Filled 2016-02-21: qty 1

## 2016-02-21 MED ORDER — FLUDROCORTISONE ACETATE 0.1 MG PO TABS
0.1000 mg | ORAL_TABLET | Freq: Every day | ORAL | Status: DC
  Administered 2016-02-21 – 2016-02-22 (×2): 0.1 mg via ORAL
  Filled 2016-02-21 (×2): qty 1

## 2016-02-21 NOTE — Evaluation (Signed)
Occupational Therapy Evaluation Patient Details Name: Elizabeth Mcintosh MRN: [id number] DOB: 01/25/1946 Today's Date: 02/21/2016    History of Present Illness pt is a 70 yo female admitted for c/o aphasia, confusion and R hemianopia.  Pt with PMH of glaucoma, h/o anenurysm with clip, Parkinsons, MSA.   Clinical Impression   Pt admitted with the above diagnosis and has the deficits listed below. Pt would benefit from cont OT to increase functional mobility with adls and independence with adls so she can return home with her husband. Pt was previously fairly independent with simple mobility and adls tasks PTA. Pt is now very limited (max assist with most adls) and very confused with worsening memory.  Pt urinated several times during treatment.  Urine was foul smelling, thick and discolored.  Nursing notified.  Did not see any urinalysis results since June.  Feel pt will benefit from further therapy and currently is not safe to return home.    Follow Up Recommendations  Supervision/Assistance - 24 hour;Home health OT    Equipment Recommendations  Tub/shower bench    Recommendations for Other Services       Precautions / Restrictions Precautions Precautions: Fall Precaution Comments: Pt fall risk Restrictions Weight Bearing Restrictions: No Other Position/Activity Restrictions: Pt urinates each time we transferred sit to stand.      Mobility Bed Mobility Overal bed mobility: Needs Assistance Bed Mobility: Supine to Sit;Sit to Supine     Supine to sit: Max assist Sit to supine: Max assist   General bed mobility comments: Pt very rigid and did not participate much in mobilty.  Therapist able to sit pt up w/o assist bc pt is so rigid.  Transfers Overall transfer level: Needs assistance Equipment used: 1 person hand held assist Transfers: Sit to/from UGI CorporationStand;Stand Pivot Transfers Sit to Stand: Mod assist Stand pivot transfers: Mod assist       General transfer comment: Pt very  unsteady on feet and not participating much in transfer.    Balance Overall balance assessment: Needs assistance Sitting-balance support: Feet supported;Bilateral upper extremity supported Sitting balance-Leahy Scale: Poor Sitting balance - Comments: Pt with R lean at all times in sitting Postural control: Right lateral lean Standing balance support: During functional activity;Bilateral upper extremity supported Standing balance-Leahy Scale: Zero Standing balance comment: Pt requires outside assist to remain standing.                            ADL Overall ADL's : Needs assistance/impaired Eating/Feeding: Minimal assistance;Sitting;Bed level   Grooming: Wash/dry face;Wash/dry hands;Oral care;Minimal assistance;Sitting   Upper Body Bathing: Moderate assistance;Sitting   Lower Body Bathing: Maximal assistance;Sit to/from stand;Cueing for sequencing Lower Body Bathing Details (indicate cue type and reason): Pt very rigid and unable to reach much below her knees. Pt requires mod assist to stand and then someone else washed backside. Upper Body Dressing : Maximal assistance;Sitting   Lower Body Dressing: Total assistance;Sit to/from stand;Cueing for sequencing Lower Body Dressing Details (indicate cue type and reason): Pt with poor balance on side of bed requiring a lot of assist for LE dressing. Pt could not reach her feet and do any part of LE dressing due to poor balance, weakness, and fatigue. Toilet Transfer: Moderate assistance;Stand-pivot;Cueing for sequencing Toilet Transfer Details (indicate cue type and reason): Pt pivoted/uses her rigidity to her advantage when standing. Toileting- Clothing Manipulation and Hygiene: Total assistance;+2 for physical assistance;Sit to/from stand Toileting - Clothing Manipulation Details (indicate cue type  and reason): total assist X2. One stood with pt and second person managed clothing.     Functional mobility during ADLs: Moderate  assistance;Cueing for sequencing General ADL Comments: pt requires a great amount of assist for adls due to weakness, poor balance, poor cognition and decreased mobility.  Pt urinated several times during treatment as well as having a bowel movement.  Urine was think, foul smelling and had a greenish tint.  Nursing notified immediately.     Vision Vision Assessment?: Vision impaired- to be further tested in functional context Additional Comments: needs further testing   Perception Perception Perception Tested?: No   Praxis Praxis Praxis tested?: Not tested    Pertinent Vitals/Pain Pain Assessment: Faces Faces Pain Scale: Hurts little more Pain Location: legs Pain Descriptors / Indicators: Aching Pain Intervention(s): Limited activity within patient's tolerance;Repositioned;Monitored during session     Hand Dominance Right   Extremity/Trunk Assessment Upper Extremity Assessment Upper Extremity Assessment: RUE deficits/detail;LUE deficits/detail RUE Deficits / Details: Pt with rigidity and very slow to initiate movement.  Fine motor tasks appear difficult. RUE Coordination: decreased fine motor LUE Deficits / Details: Pt with rigidity. Pt with full ROM but generalized weakness.  pt with poor fine motor coordination. Pt slow with all movements. LUE Coordination: decreased fine motor   Lower Extremity Assessment Lower Extremity Assessment: Defer to PT evaluation       Communication Communication Communication: Other (comment) (difficult to assess confusion vs. aphasia)   Cognition Arousal/Alertness: Awake/alert Behavior During Therapy: Flat affect Overall Cognitive Status: Impaired/Different from baseline Area of Impairment: Orientation;Attention;Memory;Safety/judgement;Awareness;Problem solving Orientation Level: Disoriented to;Place;Time;Situation Current Attention Level: Focused Memory: Decreased recall of precautions;Decreased short-term memory   Safety/Judgement:  Decreased awareness of safety;Decreased awareness of deficits Awareness: Intellectual Problem Solving: Slow processing;Decreased initiation;Difficulty sequencing;Requires verbal cues;Requires tactile cues General Comments: Pt was fairly independent PTA but did recieve assist b/c it was more efficient.  Pt also had some memory deficits PTA but nothing like it is now.  Husband stated he wanted to take her home today but had long discussion with him about her current status and that she would need a lot of assist at home at this level.  Pt would not be safe going home in this current state.   General Comments       Exercises       Shoulder Instructions      Home Living Family/patient expects to be discharged to:: Private residence Living Arrangements: Spouse/significant other Available Help at Discharge: Available 24 hours/day Type of Home: House Home Access: Stairs to enter [employer] of Steps: 10 front door. None at back door. Entrance Stairs-Rails: Left;Right;Can reach both Home Layout: One level     Bathroom Shower/Tub: Tub/shower unit;Curtain Shower/tub characteristics: Engineer, building services: Standard     Home Equipment: Environmental consultant - 2 wheels;Cane - single point;Bedside commode;Shower seat   Additional Comments: Pt needs a tub bench for safety reasons.      Prior Functioning/Environment Level of Independence: Needs assistance  Gait / Transfers Assistance Needed: pt walked cane short distances w/o assist PTA. ADL's / Homemaking Assistance Needed: husband and caregiver do all cooking and clearning.  pt can bathe self and dresses with min assist, can change own depends.  Caregiver comes MWF 9-12 to assist with adls b/c it taskes too long for her to do it.        OT Diagnosis: Generalized weakness;Cognitive deficits;Disturbance of vision;Acute pain;Altered mental status   OT Problem List: Decreased strength;Decreased activity tolerance;Impaired balance (sitting  and/or standing);Impaired vision/perception;Decreased coordination;Decreased cognition;Decreased safety awareness;Decreased knowledge of use of DME or AE;Decreased knowledge of precautions;Impaired tone;Impaired UE functional use;Pain   OT Treatment/Interventions: Self-care/ADL training;Therapeutic activities;DME and/or AE instruction    OT Goals(Current goals can be found in the care plan section) Acute Rehab OT Goals Patient Stated Goal: to go home today OT Goal Formulation: With patient/family Time For Goal Achievement: 03/06/16 Potential to Achieve Goals: Fair ADL Goals Pt Will Perform Eating: with set-up;sitting Pt Will Perform Grooming: with set-up;sitting Pt Will Perform Upper Body Bathing: with set-up;sitting Pt Will Perform Upper Body Dressing: with set-up;sitting Pt Will Perform Tub/Shower Transfer: Tub transfer;with min assist;ambulating;tub bench;rolling walker Additional ADL Goal #1: Pt will comlete all toileting tasks including changing own depends with min assistt  OT Frequency: Min 2X/week   Barriers to D/C: Decreased caregiver support  husband is with pt most of time.  Caregiver there 3 days a week for 3 hours.  Husband will need more assist at this point to care for pt.       Co-evaluation              End of Session Equipment Utilized During Treatment: Engineer, water Communication: Mobility status;Other (comment) (pt on bedpan at end of session)  Activity Tolerance: Patient limited by fatigue Patient left: in chair;with call bell/phone within reach;with family/visitor present;Other (comment) (on bedpan and  nursing aware.)   Time: 1007-1100 OT Time Calculation (min): 53 min Charges:  OT General Charges $OT Visit: 1 Procedure OT Evaluation $OT Eval Moderate Complexity: 1 Procedure OT Treatments $Self Care/Home Management : 23-37 mins G-Codes: OT G-codes **NOT FOR INPATIENT CLASS** Functional Assessment Tool Used: clinical judgment Functional  Limitation: Self care Self Care Current Status (Z6109): At least 80 percent but less than 100 percent impaired, limited or restricted Self Care Goal Status (U0454): At least 20 percent but less than 40 percent impaired, limited or restricted  Elizabeth Mcintosh 02/21/2016, 11:26 AM  [phone]

## 2016-02-21 NOTE — Progress Notes (Signed)
  Echocardiogram 2D Echocardiogram has been performed.  Elizabeth SavoyCasey N Soul Deveney 02/21/2016, 12:38 PM

## 2016-02-21 NOTE — Progress Notes (Signed)
STROKE TEAM PROGRESS NOTE   SUBJECTIVE (INTERVAL HISTORY) Her husband is at the bedside.  Overall she feels her condition is stable. No acute events overnight. Repeat CT head stable no acute abnormality.     OBJECTIVE Temp:  [98.3 F (36.8 C)-99.5 F (37.5 C)] 98.3 F (36.8 C) (09/14 0546) Pulse Rate:  [82-91] 85 (09/14 0546) Cardiac Rhythm: Normal sinus rhythm (09/14 0702) Resp:  [17-25] 18 (09/14 0546) BP: (99-118)/(43-60) 106/43 (09/14 0546) SpO2:  [90 %-94 %] 93 % (09/14 0546)  No results for input(s): GLUCAP in the last 168 hours.  Recent Labs Lab 02/19/16 1337 02/19/16 1340 02/21/16 0652  NA 138 138 137  K 4.5 4.4 4.0  CL 101 97* 103  CO2 27  --  26  GLUCOSE 156* 149* 101*  BUN 20 22* 20  CREATININE 0.94 0.80 0.81  CALCIUM 9.7  --  9.0    Recent Labs Lab 02/19/16 1337  AST 21  ALT 5*  ALKPHOS 82  BILITOT 1.7*  PROT 6.6  ALBUMIN 4.3    Recent Labs Lab 02/19/16 1337 02/19/16 1340 02/21/16 0652  WBC 9.0  --  8.6  NEUTROABS 7.3  --  6.7  HGB 14.0 14.6 12.4  HCT 41.7 43.0 37.9  MCV 90.5  --  92.9  PLT 231  --  205    Recent Labs Lab 02/19/16 2009  TROPONINI <0.03    Recent Labs  02/19/16 1337  LABPROT 13.7  INR 1.05   No results for input(s): COLORURINE, LABSPEC, PHURINE, GLUCOSEU, HGBUR, BILIRUBINUR, KETONESUR, PROTEINUR, UROBILINOGEN, NITRITE, LEUKOCYTESUR in the last 72 hours.  Invalid input(s): APPERANCEUR     Component Value Date/Time   CHOL 189 02/20/2016 0324   TRIG 51 02/20/2016 0324   HDL 52 02/20/2016 0324   CHOLHDL 3.6 02/20/2016 0324   VLDL 10 02/20/2016 0324   LDLCALC 127 (H) 02/20/2016 0324   Lab Results  Component Value Date   HGBA1C 5.4 02/20/2016   No results found for: LABOPIA, COCAINSCRNUR, LABBENZ, AMPHETMU, THCU, LABBARB  No results for input(s): ETH in the last 168 hours.   Imaging  I have personally reviewed the radiological images below and agree with the radiology interpretations.  Ct Angio Head W  Or Wo Contrast 02/19/2016 IMPRESSION: CTA NECK IMPRESSION: 1. Mild atherosclerotic changes within the aortic arch and carotid bifurcations bilaterally without high-grade or significant stenosis. Irregularity greatest at the right carotid bifurcation were there is associated 20% narrowing by NASCET criteria, mildly progressed relative to 2016. 2. Atheromatous plaque at the origin of the right vertebral artery with approximately 50% stenosis. Vertebral arteries otherwise widely patent within the neck. CTA HEAD IMPRESSION: 1. Atheromatous plaque throughout the anterior circulation, overall relatively similar to previous exam. Changes greatest within the carotid siphons, proximal right right M1 segment, and anterior cerebral arteries. Narrowing at the supraclinoid right ICA estimated at 50% with additional 50% stenosis proximal right M1 segment. Short-segment severe proximal left A2 stenosis. 2. Short segment severe right P2 stenosis, similar. No other high-grade stenosis within the posterior circulation. 3. Sequelae of prior aneurysm clipping and/or coiling at the right anterior communicating artery complex. No evidence for recurrent aneurysm.   Ct Head Code Stroke W/o Cm 02/19/2016 IMPRESSION: 1. No evidence of acute intracranial abnormality. 2. ASPECTS is 10. 3. Unchanged encephalomalacia in the right cerebral hemisphere. Chronic left caudate lacunar infarct.   CT head without contrast 02/21/2016 1. No acute hemorrhage or evidence of acute cortical infarct. 2. Unchanged right frontal encephalomalacia and  postsurgical changes of prior aneurysm clipping.  2D Echocardiogram  pending - Left ventricle: The cavity size was normal. There was mild focal   basal hypertrophy of the septum. Systolic function was normal.   The estimated ejection fraction was in the range of 55% to 60%.   Wall motion was normal; there were no regional wall motion   abnormalities. Left ventricular diastolic function parameters    were normal.  EEG  02/21/2016 Impression: This awake and asleep EEG is abnormal due to the presence of: 1. Moderate diffuse slowing of the waking background 2. Additional focal slowing over the right hemisphere, maximal over the right temporal region Clinical Correlation of the above findings indicates diffuse cerebral dysfunction that is non-specific in etiology and can be seen with hypoxic/ischemic injury, toxic/metabolic encephalopathies, neurodegenerative disorders, or medication effect. Focal slowing over the right hemisphere indicates focal cerebral dysfunction suggestive of underlying structural or physiologic abnormality in this region.  The absence of epileptiform discharges does not rule out a clinical diagnosis of epilepsy.  Clinical correlation is advised.    PHYSICAL EXAM  Temp:  [98.3 F (36.8 C)-99.5 F (37.5 C)] 98.3 F (36.8 C) (09/14 0546) Pulse Rate:  [82-91] 85 (09/14 0546) Resp:  [17-25] 18 (09/14 0546) BP: (99-118)/(43-60) 106/43 (09/14 0546) SpO2:  [90 %-94 %] 93 % (09/14 0546)  General - Well nourished, well developed, in no apparent distress.  Ophthalmologic - fundi not visualized due to incorporation.  Cardiovascular - Regular rate and rhythm with no murmur.  Mental Status -  Awake, alert, orientated to people, but not orientated to place, time. Language including expression, naming, repetition, comprehension was assessed and found intact, however, significant psychomotor slowing Fund of Knowledge was assessed and know current president.  Cranial Nerves II - XII - II - Visual field intact OU. III, IV, VI - Extraocular movements intact. V - Facial sensation intact bilaterally. VII - Facial movement intact bilaterally. VIII - Hearing & vestibular intact bilaterally. X - Palate elevates symmetrically. XI - Chin turning & shoulder shrug intact bilaterally. XII - Tongue protrusion intact.  Motor Strength - The patient's strength was symmetrical in all  extremities and pronator drift was absent. However, significant bradykinesia bilaterally, especially on initiation of movement. Bulk was normal and fasciculations were absent.   Motor Tone - Muscle tone was assessed at the neck and appendages and was normal except right LE the increased muscle tone and cogwheeling.  Reflexes - The patient's reflexes were 1+ in all extremities and she had no pathological reflexes.  Sensory - Light touch, temperature/pinprick were assessed and were symmetrical.    Coordination - The patient had normal movements in the hands with no ataxia or dysmetria, however significant bradykinesia. Right lower extremity mild cogwheeling present. Tremor was absent.  Gait and Station - not tested due to safety concerns.   ASSESSMENT/PLAN Ms. SENETRA DILLIN is a 70 y.o. 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 Symptoms stable.    TIA vs. encephalopathy:  Initial concerning for right hemianopia, however not present on today's exam. No focal neurological deficit.   MRI  not able to perform due to old aneurysm clip  MRA  not able to perform due to old aneurysm clip  CTA head and neck - atherosclerosis at right cavernous ICA, right MCA, left A2, right P2  2D Echo EF 55-60%  EEG -  diffuse slowing, no seizure  CT head repeat - No  acute hemorrhage or evidence of acute cortical infarct.  LDL 127  HgbA1c - 5.4  Lovenox for VTE prophylaxis  Diet regular Room service appropriate? Yes; Fluid consistency: Thin   aspirin 81 mg daily prior to admission, now on clopidogrel 75 mg daily. Continue Plavix on discharge.  Patient counseled to be compliant with her antithrombotic medications  Ongoing aggressive stroke risk factor management  Therapy recommendations:  HH OT recommended - PT eval pending  Disposition:  Pending  MSA with orthostatic hypotension  [facility]us [facility]  On Sinemet and Mirapex  On droxidopa and florinef   Continue follow-up with Dr. Arbutus Leasat Avoid hypotension as able  Hyperlipidemia  Home meds:  None   LDL 127, goal < 70  Add Lipitor 20 mg  Continue statin at discharge  Other Stroke Risk Factors  Advanced age  Intracranial multifocal arterial stenosis  Other Active Problems  Hyperglycemia  Other Pertinent History  ACOM aneurysm [facility]ipping [facility]  Hospital day # 0  Neurology will sign off. Please call with questions. Pt will follow up with Dr. Arbutus Leasat on 02/26/16. Thanks for the consult.  Marvel PlanJindong Saraia Platner, MD PhD Stroke Neurology 02/21/2016 4:51 PM    To contact Stroke Continuity provider, please refer to [email or web address]. After hours, contact General Neurology

## 2016-02-21 NOTE — Progress Notes (Signed)
PROGRESS NOTE    Elizabeth Mcintosh  ZOX:[id number] DOB: 06/07/1946 DOA: 02/19/2016 PCP: Gwen PoundsUSSO,[name], MD     Brief Narrative:  Elizabeth Mcintosh is a 70 y.o. 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 She was evaluated by neurology in the ED and was admitted for stroke work up. Due to her prior aneurysm clipping, she is unable to undergo MRI. Patient was transferred to stepdown unit after hypotensive and unresponsive episode following medication administration by her husband (florinef and carbidopa). Rapid response was called and patient's BP improved with fluid bolus.   Assessment & Plan:   Principal Problem:   Aphasia Active Problems:   Akinetic rigid Parkinsons disease   Autonomic postural hypotension   Multiple system atrophy, Parkinson variant (HCC)   Cerebral infarction due to unspecified mechanism   HLD (hyperlipidemia)   Acute encephalopathy     Aphasia and intermittent encephalopathy  -Unclear etiology, TIA vs seizure vs metabolic vs infectious  -Neurology/Stroke team consulted and following  -Hemoglobin A1c 5.4 -Neurochecks  -CTA head and neck: atherosclerosis at right cavernous ICA, right MCA, left A2, right P2 -PT/OT/SLP evaluation -Plavix daily - will be discharged with plavix monotherapy   -Start lipitor 20mg  - will be discharge with this  -EEG: nonspecific moderate diffuse slowing, focal slowing right temporal region -Repeat CT head this morning: unchanged encephalomalacia, no hemorrhage or acute infarct  -Echocardiogram pending  -Check UA due to continued intermittent confusion, urinary incontinence, abnormal urine appearance   Akinetic rigid Parkinsons disease/Multiple system atrophy, Parkinson variant  -[facility] -Continue carbidopa/levodopa  -Continue  Mirapex -Continue Myrbetriq for Parkinson's related bladder issues  Autonomic postural hypotension -Continue Florinef and Droxidopa   DVT prophylaxis: Lovenox Code Status: Full Family Communication: spoke with husband Jeffery at bedside  Disposition Plan: Patient will be inpatient for further pending work up and management of encephalopathy and aphasia as well as weakness and I anticipate she will stay >2 midnights for treatment. Continue PT/OT/SLP.   Consultants:   Neurology   Procedures:   EEG 9/13: This awake and asleep EEG is abnormal due to the presence of:  1. Moderate diffuse slowing of the waking background  2. Additional focal slowing over the right hemisphere, maximal over the right temporal region  Antimicrobials:   None     Subjective: Patient's husband states that her confusion is much better today, although she does not know the month. She worked with OT this morning and had significant urinary incontinence and purulent appearance of urine. She does not have much complaints this morning and is oriented to self and place as well as people. She denies any focal, new weakness or deficits. She denies any fevers, chest pain, shortness of breath, nausea, vomiting, diarrhea, abdominal pain. She was able to tolerate breakfast this morning.   Objective: Vitals:   02/20/16 1800 02/20/16 1929 02/20/16 2226 02/21/16 0546  BP: (!) 118/59 (!) 99/49 (!) 99/52 (!) 106/43  Pulse: 82 83 83 85  Resp: 18 (!) 25 18 18   Temp:  98.5 F (36.9 C) 99 F (37.2 C) 98.3 F (36.8 C)  TempSrc:  Axillary Oral Oral  SpO2: 92% 90% 94% 93%  Weight:        Intake/Output Summary (Last 24 hours) at 02/21/16 1123 Last data filed at 02/21/16 0015  Gross per 24 hour  Intake  240 ml  Output                0 ml  Net              240 ml   Filed Weights   02/19/16 1300 02/19/16 1403  Weight: 64.9 kg (143 lb 1.3 oz) 64.9 kg (143 lb 1.3 oz)    Examination:  General exam:  Appears calm and comfortable, sitting in chair on bedpan Respiratory system: Clear to auscultation. Respiratory effort normal. Cardiovascular system: S1 & S2 heard, RRR. No JVD, murmurs, rubs, gallops or clicks. No pedal edema. Gastrointestinal system: Abdomen is nondistended, soft and nontender. No organomegaly or masses felt. Normal bowel sounds heard. Central nervous system: Alert and oriented to self and place but not time. No focal neurological deficits. No gross CN 2-12 deficits.  Extremities: Bilateral upper extremities are 2/5 in strength, bilateral lower extremities are 4/5 in strength. Hand grip strong and equal  Skin: No rashes, lesions or ulcers Psychiatry: Judgement and insight appear normal. Mood & affect appropriate.   Data Reviewed: I have personally reviewed following labs and imaging studies  CBC:  Recent Labs Lab 02/19/16 1337 02/19/16 1340 02/21/16 0652  WBC 9.0  --  8.6  NEUTROABS 7.3  --  6.7  HGB 14.0 14.6 12.4  HCT 41.7 43.0 37.9  MCV 90.5  --  92.9  PLT 231  --  205   Basic Metabolic Panel:  Recent Labs Lab 02/19/16 1337 02/19/16 1340 02/21/16 0652  NA 138 138 137  K 4.5 4.4 4.0  CL 101 97* 103  CO2 27  --  26  GLUCOSE 156* 149* 101*  BUN 20 22* 20  CREATININE 0.94 0.80 0.81  CALCIUM 9.7  --  9.0   GFR: Estimated Creatinine Clearance: 66.1 mL/min (by C-G formula based on SCr of 0.81 mg/dL). Liver Function Tests:  Recent Labs Lab 02/19/16 1337  AST 21  ALT 5*  ALKPHOS 82  BILITOT 1.7*  PROT 6.6  ALBUMIN 4.3   No results for input(s): LIPASE, AMYLASE in the last 168 hours. No results for input(s): AMMONIA in the last 168 hours. Coagulation Profile:  Recent Labs Lab 02/19/16 1337  INR 1.05   Cardiac Enzymes:  Recent Labs Lab 02/19/16 2009  TROPONINI <0.03   BNP (last 3 results) No results for input(s): PROBNP in the last 8760 hours. HbA1C:  Recent Labs  02/20/16 0324  HGBA1C 5.4   CBG: No results for input(s):  GLUCAP in the last 168 hours. Lipid Profile:  Recent Labs  02/20/16 0324  CHOL 189  HDL 52  LDLCALC 127*  TRIG 51  CHOLHDL 3.6   Thyroid Function Tests: No results for input(s): TSH, T4TOTAL, FREET4, T3FREE, THYROIDAB in the last 72 hours. Anemia Panel: No results for input(s): VITAMINB12, FOLATE, FERRITIN, TIBC, IRON, RETICCTPCT in the last 72 hours. Sepsis Labs: No results for input(s): PROCALCITON, LATICACIDVEN in the last 168 hours.  Recent Results (from the past 240 hour(s))  MRSA PCR Screening     Status: None   Collection Time: 02/20/16  8:00 AM  Result Value Ref Range Status   MRSA by PCR NEGATIVE NEGATIVE Final    Comment:        The GeneXpert MRSA Assay (FDA approved for NASAL specimens only), is one component of a comprehensive MRSA colonization surveillance program. It is not intended to diagnose MRSA infection nor to guide or monitor treatment for MRSA infections.  Radiology Studies: Ct Angio Head W Or Wo Contrast  Result Date: 02/19/2016 CLINICAL DATA:  Only done with were interested gland with a with a of gap about the there are out flattened of the sella at the end there 5 months images a we keep at the 7 -with foot could mass EXAM: CT ANGIOGRAPHY HEAD AND NECK TECHNIQUE: Multidetector CT imaging of the head and neck was performed using the standard protocol during bolus administration of intravenous contrast. Multiplanar CT image reconstructions and MIPs were obtained to evaluate the vascular anatomy. Carotid stenosis measurements (when applicable) are obtained utilizing NASCET criteria, using the distal internal carotid diameter as the denominator. CONTRAST:  50 cc of Isovue COMPARISON:  Prior CT from earlier the same day as well was prior CT 11/13/2014. FINDINGS: CTA NECK Aortic arch: Visualized aortic arch of normal caliber with normal 3 vessel morphology. Mild atheromatous plaque within the aortic arch in at the origin of the great vessels  without flow limiting stenosis. Visualized subclavian arteries are widely patent. Right carotid system: Right common carotid artery patent from its origin to the bifurcation. Mild atheromatous plaque about the right bifurcation with associated narrowing of up to 20% by NASCET criteria. This has mildly progressed from previous. Right ICA patent distally to the skullbase without stenosis, dissection, or occlusion. Right external carotid artery and its branches within normal limits. Left carotid system: Left common carotid artery patent from its origin to the bifurcation. Minimal a centric plaque about the left bifurcation without stenosis. Left ICA patent distally without stenosis, dissection, or occlusion. Left external carotid artery and its branches within normal limits. Vertebral arteries:Both vertebral arteries arise from the subclavian arteries. Vertebral arteries are largely code dominant. Atheromatous plaque at the origin of the right vertebral artery with approximately 50% narrowing. Vertebral arteries otherwise widely patent within the neck without stenosis, dissection, or occlusion. Skeleton: No acute osseous abnormality. No worrisome lytic or blastic osseous lesions. Other neck: Mild atelectatic changes within the visualized lungs. Visualized lungs are otherwise clear. Mediastinum within normal limits. Thyroid normal. No adenopathy within the neck. No acute soft tissue abnormality. CTA HEAD Anterior circulation: Petrous segments patent bilaterally without stenosis. Multifocal atheromatous plaque within the cavernous ICAs bilaterally, slightly worse on the right. There is associated moderate multi focal narrowing of up to 50%, most prevalent at the supraclinoid right ICA. A1 segments are patent. Surgical clip present at the right aspect of the anterior communicating artery complex. No residual aneurysm. Anterior cerebral arteries patent distally but demonstrate multifocal atheromatous irregularity, similar  to previous. There is a superimposed relatively severe stenosis at the proximal left A2 segment (series 10, image 23). Moderate narrowing of approximately 50% within the proximal right M1 segment. Right M1 segment patent distally. Right MCA bifurcation normal. No proximal M2 occlusion. Right MCA branches opacified to their distal aspects. Left M1 segment mildly irregular without significant stenosis. Left MCA bifurcation normal. Distal left MCA branches well opacified to their distal aspects. Posterior circulation: Vertebral arteries patent to the vertebrobasilar junction. Mild atheromatous irregularity in the distal right V4 segment without significant stenosis. Basilar artery widely patent. Superior cerebral arteries patent bilaterally. Both of the posterior cerebral arteries arise from the basilar artery. Left PCA well opacified and patent to its distal aspect. There is a severe short-segment stenosis right P2 segment (series 8, image 116). This is similar to previous. Venous sinuses: Contents. Anatomic variants: No significant anatomic variant. No new aneurysm identified. Delayed phase: No pathologic enhancement. IMPRESSION: CTA NECK IMPRESSION: 1.  Mild atherosclerotic changes within the aortic arch and carotid bifurcations bilaterally without high-grade or significant stenosis. Irregularity greatest at the right carotid bifurcation were there is associated 20% narrowing by NASCET criteria, mildly progressed relative to 2016. 2. Atheromatous plaque at the origin of the right vertebral artery with approximately 50% stenosis. Vertebral arteries otherwise widely patent within the neck. CTA HEAD IMPRESSION: 1. Atheromatous plaque throughout the anterior circulation, overall relatively similar to previous exam. Changes greatest within the carotid siphons, proximal right right M1 segment, and anterior cerebral arteries. Narrowing at the supraclinoid right ICA estimated at 50% with additional 50% stenosis proximal  right M1 segment. Short-segment severe proximal left A2 stenosis. 2. Short segment severe right P2 stenosis, similar. No other high-grade stenosis within the posterior circulation. 3. Sequelae of prior aneurysm clipping and/or coiling at the right anterior communicating artery complex. No evidence for recurrent aneurysm. Electronically Signed   By: Rise Mu M.D.   On: 02/19/2016 21:56   Ct Head Wo Contrast  Result Date: 02/21/2016 CLINICAL DATA:  History of Parkinson's disease. Follow-up stroke. Status post aneurysm clipping. EXAM: CT HEAD WITHOUT CONTRAST TECHNIQUE: Contiguous axial images were obtained from the base of the skull through the vertex without intravenous contrast. COMPARISON:  Head CT 02/19/2016 FINDINGS: Brain: Area of hypoattenuation in the right frontal periventricular white matter is unchanged. There is no acute hemorrhage. No midline shift or mass effect. No evidence of acute cortical infarct. Vascular: Aneurysm clip present over glue across Lewis is again noted. Skull: Status post remote right pterional craniectomy. Sinuses/Orbits: No fluid levels or advanced mucosal thickening. Normal orbits. Other: None IMPRESSION: 1. No acute hemorrhage or evidence of acute cortical infarct. 2. Unchanged right frontal encephalomalacia and postsurgical changes of prior aneurysm clipping. Electronically Signed   By: Deatra Robinson M.D.   On: 02/21/2016 06:56   Ct Angio Neck W Or Wo Contrast  Result Date: 02/19/2016 CLINICAL DATA:  Only done with were interested gland with a with a of gap about the there are out flattened of the sella at the end there 5 months images a we keep at the 7 -with foot could mass EXAM: CT ANGIOGRAPHY HEAD AND NECK TECHNIQUE: Multidetector CT imaging of the head and neck was performed using the standard protocol during bolus administration of intravenous contrast. Multiplanar CT image reconstructions and MIPs were obtained to evaluate the vascular anatomy. Carotid  stenosis measurements (when applicable) are obtained utilizing NASCET criteria, using the distal internal carotid diameter as the denominator. CONTRAST:  50 cc of Isovue COMPARISON:  Prior CT from earlier the same day as well was prior CT 11/13/2014. FINDINGS: CTA NECK Aortic arch: Visualized aortic arch of normal caliber with normal 3 vessel morphology. Mild atheromatous plaque within the aortic arch in at the origin of the great vessels without flow limiting stenosis. Visualized subclavian arteries are widely patent. Right carotid system: Right common carotid artery patent from its origin to the bifurcation. Mild atheromatous plaque about the right bifurcation with associated narrowing of up to 20% by NASCET criteria. This has mildly progressed from previous. Right ICA patent distally to the skullbase without stenosis, dissection, or occlusion. Right external carotid artery and its branches within normal limits. Left carotid system: Left common carotid artery patent from its origin to the bifurcation. Minimal a centric plaque about the left bifurcation without stenosis. Left ICA patent distally without stenosis, dissection, or occlusion. Left external carotid artery and its branches within normal limits. Vertebral arteries:Both vertebral arteries arise from the subclavian  arteries. Vertebral arteries are largely code dominant. Atheromatous plaque at the origin of the right vertebral artery with approximately 50% narrowing. Vertebral arteries otherwise widely patent within the neck without stenosis, dissection, or occlusion. Skeleton: No acute osseous abnormality. No worrisome lytic or blastic osseous lesions. Other neck: Mild atelectatic changes within the visualized lungs. Visualized lungs are otherwise clear. Mediastinum within normal limits. Thyroid normal. No adenopathy within the neck. No acute soft tissue abnormality. CTA HEAD Anterior circulation: Petrous segments patent bilaterally without stenosis.  Multifocal atheromatous plaque within the cavernous ICAs bilaterally, slightly worse on the right. There is associated moderate multi focal narrowing of up to 50%, most prevalent at the supraclinoid right ICA. A1 segments are patent. Surgical clip present at the right aspect of the anterior communicating artery complex. No residual aneurysm. Anterior cerebral arteries patent distally but demonstrate multifocal atheromatous irregularity, similar to previous. There is a superimposed relatively severe stenosis at the proximal left A2 segment (series 10, image 23). Moderate narrowing of approximately 50% within the proximal right M1 segment. Right M1 segment patent distally. Right MCA bifurcation normal. No proximal M2 occlusion. Right MCA branches opacified to their distal aspects. Left M1 segment mildly irregular without significant stenosis. Left MCA bifurcation normal. Distal left MCA branches well opacified to their distal aspects. Posterior circulation: Vertebral arteries patent to the vertebrobasilar junction. Mild atheromatous irregularity in the distal right V4 segment without significant stenosis. Basilar artery widely patent. Superior cerebral arteries patent bilaterally. Both of the posterior cerebral arteries arise from the basilar artery. Left PCA well opacified and patent to its distal aspect. There is a severe short-segment stenosis right P2 segment (series 8, image 116). This is similar to previous. Venous sinuses: Contents. Anatomic variants: No significant anatomic variant. No new aneurysm identified. Delayed phase: No pathologic enhancement. IMPRESSION: CTA NECK IMPRESSION: 1. Mild atherosclerotic changes within the aortic arch and carotid bifurcations bilaterally without high-grade or significant stenosis. Irregularity greatest at the right carotid bifurcation were there is associated 20% narrowing by NASCET criteria, mildly progressed relative to 2016. 2. Atheromatous plaque at the origin of the  right vertebral artery with approximately 50% stenosis. Vertebral arteries otherwise widely patent within the neck. CTA HEAD IMPRESSION: 1. Atheromatous plaque throughout the anterior circulation, overall relatively similar to previous exam. Changes greatest within the carotid siphons, proximal right right M1 segment, and anterior cerebral arteries. Narrowing at the supraclinoid right ICA estimated at 50% with additional 50% stenosis proximal right M1 segment. Short-segment severe proximal left A2 stenosis. 2. Short segment severe right P2 stenosis, similar. No other high-grade stenosis within the posterior circulation. 3. Sequelae of prior aneurysm clipping and/or coiling at the right anterior communicating artery complex. No evidence for recurrent aneurysm. Electronically Signed   By: Rise Mu M.D.   On: 02/19/2016 21:56   Ct Head Code Stroke W/o Cm  Result Date: 02/19/2016 CLINICAL DATA:  Code stroke.  Sudden onset aphasia. EXAM: CT HEAD WITHOUT CONTRAST TECHNIQUE: Contiguous axial images were obtained from the base of the skull through the vertex without intravenous contrast. 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 Encephalomalacia in the anterior right temporal and inferior right frontal lobes and anterior right basal ganglia is unchanged. Ex vacuo enlargement of the right frontal horn is unchanged. There is a chronic lacunar infarct in the left caudate head. There is no evidence of acute cortical infarct, intracranial hemorrhage, mass, midline shift, or extra-axial fluid collection.  Orbits are unremarkable. Paranasal sinuses and mastoid air cells are clear. Calcified atherosclerosis is noted at the skullbase. ASPECTS [facility] Stroke Program Early CT Score) - Ganglionic level infarction (caudate, lentiform nuclei, internal capsule, insula, M1-M3 cortex): 7 - Supraganglionic infarction  (M4-M6 cortex): 3 Total score (0-10 with 10 being normal): 10 IMPRESSION: 1. No evidence of acute intracranial abnormality. 2. ASPECTS is 10. 3. Unchanged encephalomalacia in the right cerebral hemisphere. Chronic left caudate lacunar infarct. These results were called by telephone at the time of interpretation on 02/19/2016 at 2:01 pm to Dr. Amada Jupiter, who verbally acknowledged these results. Electronically Signed   By: Sebastian Ache M.D.   On: 02/19/2016 14:02        Scheduled Meds: . atorvastatin  20 mg Oral q1800  . carbidopa-levodopa  1 tablet Oral QHS  . carbidopa-levodopa  2 tablet Oral TID PC  . clopidogrel  75 mg Oral Daily  . Droxidopa  600 mg Oral TID  . enoxaparin (LOVENOX) injection  40 mg Subcutaneous Q24H  . fludrocortisone  0.2 mg Oral Daily  . latanoprost  1 drop Both Eyes QHS  . mirabegron ER  25 mg Oral Daily  . pramipexole  0.25 mg Oral TID   Continuous Infusions:    LOS: 0 days    Time spent: 30 minutes     Noralee Stain, DO [facility] Pager [phone]  If 7PM-7AM, please contact night-coverage [email or web address] Password [facility] 02/21/2016, 11:23 AM

## 2016-02-21 NOTE — Evaluation (Signed)
Physical Therapy Evaluation Patient Details Name: Elizabeth Mcintosh MRN: [id number] DOB: Jul 06, 1945 Today's Date: 02/21/2016   History of Present Illness  pt is a 70 yo female admitted for c/o aphasia, confusion and R hemianopia.  Pt with PMH of glaucoma, h/o anenurysm with clip, Parkinsons, MSA.  Clinical Impression  Pt admitted with/for aphasia, confusion and R hemianopsia.  Pt currently limited functionally due to the problems listed below.  (see problems list.)  Pt will benefit from PT to maximize function and safety to be able to get home safely with available assist of husband and friends.     Follow Up Recommendations Home health PT    Equipment Recommendations   (TBA)    Recommendations for Other Services       Precautions / Restrictions Precautions Precautions: Fall Precaution Comments: Pt fall risk Restrictions Weight Bearing Restrictions: No Other Position/Activity Restrictions: Pt urinates each time we transferred sit to stand.      Mobility  Bed Mobility Overal bed mobility: Needs Assistance Bed Mobility: Supine to Sit;Sit to Supine     Supine to sit: Max assist Sit to supine: Max assist   General bed mobility comments: With rigidity and weakness, pt unable to generate much assist  Transfers Overall transfer level: Needs assistance Equipment used: 1 person hand held assist Transfers: Sit to/from [employer] Transfers Sit to Stand: Mod assist Stand pivot transfers: Mod assist       General transfer comment: Pt very unsteady on feet and not able to participate much other than gross standing effort which fatigued out with repetitve trials.  Ambulation/Gait             General Gait Details: not able  Stairs            Wheelchair Mobility    Modified Rankin (Stroke Patients Only) Modified Rankin (Stroke Patients Only) Modified Rankin: Severe disability     Balance Overall balance assessment: Needs assistance   Sitting balance-Leahy  Scale: Poor Sitting balance - Comments: Pt with R lean most of the time (in sitting) worsened with fatigue     Standing balance-Leahy Scale: Zero Standing balance comment: Lots of support, worsened with fatigue                             Pertinent Vitals/Pain Pain Assessment: Faces Pain Score: 6  Faces Pain Scale: Hurts little more Pain Location: back Pain Descriptors / Indicators: Aching Pain Intervention(s): Monitored during session;Repositioned    Home Living Family/patient expects to be discharged to:: Private residence Living Arrangements: Spouse/significant other Available Help at Discharge: Available 24 hours/day Type of Home: House Home Access: Stairs to enter Entrance Stairs-Rails: Left;Right;Can reach both Entrance Stairs-Number of Steps: 10 front door. None at back door. Home Layout: One level Home Equipment: Walker - 2 wheels;Cane - single point;Bedside commode;Shower seat Additional Comments: Pt needs a tub bench for safety reasons.    Prior Function Level of Independence: Needs assistance   Gait / Transfers Assistance Needed: pt walked cane short distances w/o assist PTA.  ADL's / Homemaking Assistance Needed: husband and caregiver do all cooking and clearning.  pt can bathe self and dresses with min assist, can change own depends.  Caregiver comes MWF 9-12 to assist with adls b/c it taskes too long for her to do it.        Hand Dominance   Dominant Hand: Right    Extremity/Trunk Assessment   Upper Extremity Assessment:  Defer to OT evaluation           Lower Extremity Assessment: RLE deficits/detail;LLE deficits/detail RLE Deficits / Details: slow movements, inability to move either LE against gravity fully,  some rigidity bilaterally LLE Deficits / Details: slow movements, some rigidity, Weak with movements in bed, but bore weight in standing     Communication   Communication: Other (comment) (difficult to assess confusion vs.  aphasia)  Cognition Arousal/Alertness: Awake/alert Behavior During Therapy: Flat affect (with moments of nice smiles and wit) Overall Cognitive Status: Impaired/Different from baseline Area of Impairment: Orientation;Attention;Memory;Safety/judgement;Awareness;Problem solving Orientation Level: Disoriented to;Place;Time;Situation Current Attention Level: Focused Memory: Decreased recall of precautions;Decreased short-term memory   Safety/Judgement: Decreased awareness of safety;Decreased awareness of deficits Awareness: Intellectual Problem Solving: Slow processing;Decreased initiation;Difficulty sequencing;Requires verbal cues;Requires tactile cues General Comments: Pt was fairly independent PTA but did recieve assist b/c it was more efficient.  Pt also had some memory deficits PTA but nothing like it is now.  Husband stated he wanted to take her home today but had long discussion with him about her current status and that she would need a lot of assist at home at this level.  Pt would not be safe going home in this current state.    General Comments      Exercises        Assessment/Plan    PT Assessment Patient needs continued PT services  PT Diagnosis Generalized weakness   PT Problem List Decreased strength;Decreased activity tolerance;Decreased balance;Decreased mobility;Decreased coordination;Impaired tone  PT Treatment Interventions Gait training;Functional mobility training;Therapeutic activities;Therapeutic exercise;Balance training;Patient/family education;Neuromuscular re-education   PT Goals (Current goals can be found in the Care Plan section) Acute Rehab PT Goals Patient Stated Goal: to go home today PT Goal Formulation: With patient Time For Goal Achievement: 03/06/16 Potential to Achieve Goals: Good    Frequency Min 3X/week   Barriers to discharge        Co-evaluation               End of Session   Activity Tolerance: Patient limited by  fatigue Patient left: in bed;with call bell/phone within reach;with family/visitor present;with nursing/sitter in room Nurse Communication: Mobility status    Functional Assessment Tool Used: clinical judgement Functional Limitation: Mobility: Walking and moving around Mobility: Walking and Moving Around Current Status ([id number](G8978): At least 40 percent but less than 60 percent impaired, limited or restricted Mobility: Walking and Moving Around Goal Status [phone](G8979): At least 20 percent but less than 40 percent impaired, limited or restricted    Time: [id number] PT Time Calculation (min) (ACUTE ONLY): 57 min   Charges:   PT Evaluation $PT Eval Moderate Complexity: 1 Procedure PT Treatments $Therapeutic Activity: 38-52 mins   PT G Codes:   PT G-Codes **NOT FOR INPATIENT CLASS** Functional Assessment Tool Used: clinical judgement Functional Limitation: Mobility: Walking and moving around Mobility: Walking and Moving Around Current Status (N8295(G8978): At least 40 percent but less than 60 percent impaired, limited or restricted Mobility: Walking and Moving Around Goal Status [phone](G8979): At least 20 percent but less than 40 percent impaired, limited or restricted    Yarelie Hams, Eliseo GumKenneth V 02/21/2016, 5:52 PM 02/21/2016  [place] BingKen Kiyah Demartini, PT [phone]  (pager)

## 2016-02-21 NOTE — Procedures (Signed)
ELECTROENCEPHALOGRAM REPORT  Date of Study: 02/20/2016. Study was assigned to this provider on 02/21/16.  Patient's Name: Elizabeth Mcintosh MRN: [id number] Date of Birth: 1945/06/17  Referring Provider: Dr. Ritta SlotMcNeill Kirkpatrick  Clinical History: This is a 70 year old woman with confusion, mild aphasia, right hemianopia.  Medications: aspirin EC tablet 325 mg   carbidopa-levodopa (SINEMET CR) 50-200 MG per tablet controlled release 1 tablet  carbidopa-levodopa (SINEMET IR) 25-100 MG per tablet immediate release 2 tablet  Droxidopa CAPS 600 mg  enoxaparin (LOVENOX) injection 40 mg  fludrocortisone (FLORINEF) tablet 0.2 mg  latanoprost (XALATAN) 0.005 % ophthalmic solution 1 drop  mirabegron ER (MYRBETRIQ) tablet 25 mg  pramipexole (MIRAPEX) tablet 0.25 mg   Technical Summary: A multichannel digital EEG recording measured by the international 10-20 system with electrodes applied with paste and impedances below 5000 ohms performed as portable with EKG monitoring in an awake and asleep patient.  Hyperventilation and photic stimulation were not performed.  The digital EEG was referentially recorded, reformatted, and digitally filtered in a variety of bipolar and referential montages for optimal display.   Description: The patient is awake and asleep during the recording.  During maximal wakefulness, there is a poorly sustained medium voltage 8 Hz posterior dominant rhythm that poorly attenuates with eye opening and eye closure. This is admixed with a moderate amount of diffuse 4-5 Hz theta and 2-3 Hz delta slowing of the waking background, with additional focal 2-3 Hz delta slowing over the right hemisphere, maximal over the right temporal region, at times sharply contoured without clear epileptogenic potential.  During drowsiness and sleep, there is an increase in theta and delta slowing of the background. Poorly formed sleep spindles are seen. Hyperventilation and photic stimulation were not  performed.  There were no epileptiform discharges or electrographic seizures seen.    EKG lead was unremarkable.  Impression: This awake and asleep EEG is abnormal due to the presence of: 1. Moderate diffuse slowing of the waking background 2. Additional focal slowing over the right hemisphere, maximal over the right temporal region  Clinical Correlation of the above findings indicates diffuse cerebral dysfunction that is non-specific in etiology and can be seen with hypoxic/ischemic injury, toxic/metabolic encephalopathies, neurodegenerative disorders, or medication effect. Focal slowing over the right hemisphere indicates focal cerebral dysfunction suggestive of underlying structural or physiologic abnormality in this region.  The absence of epileptiform discharges does not rule out a clinical diagnosis of epilepsy.  Clinical correlation is advised.   Patrcia DollyKaren Savanna Dooley, M.D.

## 2016-02-21 NOTE — Progress Notes (Signed)
OT called nurse in room to visualize urine. Urine thick, creamy, light green color and very odorous. Made Dr Alvino Chapelhoi aware.

## 2016-02-22 DIAGNOSIS — R4701 Aphasia: Secondary | ICD-10-CM | POA: Diagnosis not present

## 2016-02-22 DIAGNOSIS — I639 Cerebral infarction, unspecified: Secondary | ICD-10-CM | POA: Diagnosis not present

## 2016-02-22 DIAGNOSIS — N3090 Cystitis, unspecified without hematuria: Secondary | ICD-10-CM

## 2016-02-22 DIAGNOSIS — I951 Orthostatic hypotension: Secondary | ICD-10-CM | POA: Diagnosis not present

## 2016-02-22 DIAGNOSIS — G9340 Encephalopathy, unspecified: Secondary | ICD-10-CM | POA: Diagnosis not present

## 2016-02-22 LAB — BASIC METABOLIC PANEL
ANION GAP: 8 (ref 5–15)
BUN: 18 mg/dL (ref 6–20)
CHLORIDE: 102 mmol/L (ref 101–111)
CO2: 26 mmol/L (ref 22–32)
CREATININE: 0.82 mg/dL (ref 0.44–1.00)
Calcium: 8.9 mg/dL (ref 8.9–10.3)
GFR calc non Af Amer: >60 mL/min (ref >60)
Glucose, Bld: 191 mg/dL — ABNORMAL HIGH (ref 65–99)
POTASSIUM: 3.8 mmol/L (ref 3.5–5.1)
SODIUM: 136 mmol/L (ref 135–145)

## 2016-02-22 LAB — CBC WITH DIFFERENTIAL/PLATELET
BASOS ABS: 0 10*3/uL (ref 0.0–0.1)
BASOS PCT: 0 %
EOS ABS: 0.2 10*3/uL (ref 0.0–0.7)
Eosinophils Relative: 3 %
HEMATOCRIT: 36.9 % (ref 36.0–46.0)
HEMOGLOBIN: 11.9 g/dL — AB (ref 12.0–15.0)
Lymphocytes Relative: 15 %
Lymphs Abs: 1.1 10*3/uL (ref 0.7–4.0)
MCH: 29.8 pg (ref 26.0–34.0)
MCHC: 32.2 g/dL (ref 30.0–36.0)
MCV: 92.5 fL (ref 78.0–100.0)
Monocytes Absolute: 0.4 10*3/uL (ref 0.1–1.0)
Monocytes Relative: 6 %
NEUTROS ABS: 5.3 10*3/uL (ref 1.7–7.7)
NEUTROS PCT: 76 %
Platelets: 187 10*3/uL (ref 150–400)
RBC: 3.99 MIL/uL (ref 3.87–5.11)
RDW: 12.4 % (ref 11.5–15.5)
WBC: 7.1 10*3/uL (ref 4.0–10.5)

## 2016-02-22 MED ORDER — FOSFOMYCIN TROMETHAMINE 3 G PO PACK
3.0000 g | PACK | Freq: Once | ORAL | Status: AC
  Administered 2016-02-22: 3 g via ORAL
  Filled 2016-02-22: qty 3

## 2016-02-22 MED ORDER — ATORVASTATIN CALCIUM 20 MG PO TABS: 20.0000 mg | ORAL_TABLET | Freq: Every day | ORAL | 0 refills | Status: DC

## 2016-02-22 MED ORDER — CLOPIDOGREL BISULFATE 75 MG PO TABS: 75.0000 mg | ORAL_TABLET | Freq: Every day | ORAL | 0 refills | Status: DC

## 2016-02-22 NOTE — Progress Notes (Signed)
Report called to Mitsy, [facility]. All questions answered. Pt will be transferred via [facility].

## 2016-02-22 NOTE — Clinical Social Work Note (Signed)
Clinical Social Work Assessment  Patient Details  Name: Elizabeth Mcintosh MRN: [id number] Date of Birth: [birth year]  Date of referral:  02/22/16               Reason for consult:  Facility Placement                Permission sought to share information with:  Facility Sport and exercise psychologist, Family Supports Permission granted to share information::  Yes, Verbal Permission Granted  Name::     Academic librarian::  SNFs  Relationship::  dtr  Contact Information:     Housing/Transportation Living arrangements for the past 2 months:  Single Family Home Source of Information:  Patient, Adult Children Patient Interpreter Needed:  None Criminal Activity/Legal Involvement Pertinent to Current Situation/Hospitalization:  No - Comment as needed Significant Relationships:  Adult Children, Spouse, Friend Lives with:  Spouse Do you feel safe going back to the place where you live?  No Need for family participation in patient care:  Yes (Comment) (husband helps with pt care at home)  Care giving concerns:  At baseline pt needs assistance but currently much weaker than normal per family and MD.  Pt lives at home with spouse who provides some assistance but feels unable to care for pt with current impairment.   Social Worker assessment / plan:  CSW consulted by MD for SNF assessment.  MD spoke with pt spouse who feels incapable of caring for pt at home and would like SNF- MD spoke with pt and also recommending SNF at DC.  CSW met with pt and dtr to discuss SNF and SNF referral process.  Employment status:  Retired Nurse, adult PT Recommendations:  Home with [place] / Referral to community resources:  [place]  Patient/Family's Response to care:  Patient and family agreeable to SNF placement- family friend had recommended [facility]  Patient/Family's Understanding of and Emotional Response to Diagnosis, Current Treatment, and Prognosis:  No  questions or concerns about treatment- hopeful pt will be able to return after short time at rehab.  Emotional Assessment Appearance:  Appears stated age Attitude/Demeanor/Rapport:    Affect (typically observed):  Appropriate, Quiet, Accepting Orientation:  Oriented to Self, Oriented to Place Alcohol / Substance use:  Not Applicable Psych involvement (Current and /or in the community):  No (Comment)  Discharge Needs  Concerns to be addressed:  Care Coordination Readmission within the last 30 days:  No Current discharge risk:  Physical Impairment Barriers to Discharge:  No Barriers Identified   Jorge Ny, LCSW 02/22/2016, 3:37 PM

## 2016-02-22 NOTE — Progress Notes (Signed)
PROGRESS NOTE    Elizabeth Mcintosh  WJX:[id number] DOB: 1946/04/27 DOA: 02/19/2016 PCP: Gwen Pounds, MD     Brief Narrative:  Elizabeth Mcintosh is a 70 y.o. 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 She was evaluated by neurology in the ED and was admitted for stroke work up. Due to her prior aneurysm clipping, she is unable to undergo MRI. Patient was transferred to stepdown unit after hypotensive and unresponsive episode following medication administration by her husband (florinef and carbidopa). Rapid response was called and patient's BP improved with fluid bolus.   Assessment & Plan:   Principal Problem:   Aphasia Active Problems:   Akinetic rigid Parkinsons disease   Autonomic postural hypotension   Multiple system atrophy, Parkinson variant (HCC)   Cerebral infarction due to unspecified mechanism   HLD (hyperlipidemia)   Acute encephalopathy     Aphasia and intermittent encephalopathy  -Unclear etiology, TIA vs seizure vs metabolic vs infectious  -Neurology/Stroke team consulted -Hemoglobin A1c 5.4 -CTA head and neck: atherosclerosis at right cavernous ICA, right MCA, left A2, right P2 -PT/OT/SLP evaluation -Plavix daily - will be discharged with plavix monotherapy   -Start lipitor 20mg  - will be discharge with this  -EEG: nonspecific moderate diffuse slowing, focal slowing right temporal region -Repeat CT head: unchanged encephalomalacia, no hemorrhage or acute infarct  -Echocardiogram: unremarkable  -Need neuro follow up with Dr. Arbutus Leas 9/19 -Started lipitor 20mg , plavix 75mg  this admission   Cystitis, unclear if prior to admission, unknown bacteria -Urine culture pending -Fosfomycin x 1   Akinetic rigid Parkinsons disease/Multiple system atrophy, Parkinson variant  -Follows Dr. [facility]us Leas at  [facility] -Continue carbidopa/levodopa  -Continue Mirapex -Continue Myrbetriq for Parkinson's related bladder issues  Autonomic postural hypotension -Continue Florinef and Droxidopa   DVT prophylaxis: Lovenox Code Status: Full Family Communication: spoke with daughter and family friend as well as caregiver at bedside. Spoke with husband, Trey Paula, on phone Disposition Plan: Pt still significantly weaker than at baseline. At baseline, she is able to walk from bathroom to bed with a cane on her own. I do not feel she is safe for discharge home with husband. Consult SW for SNF; patient and husband are agreeable.   Consultants:   Neurology   Procedures:   EEG 9/13: This awake and asleep EEG is abnormal due to the presence of:  1. Moderate diffuse slowing of the waking background  2. Additional focal slowing over the right hemisphere, maximal over the right temporal region  Antimicrobials:   None     Subjective: Patient quite tearful today. She states that she wants to go home but does not have any other specific complaints.   Objective: Vitals:   02/21/16 1335 02/21/16 2041 02/22/16 0007 02/22/16 0420  BP: 135/63 (!) 90/48 (!) 96/47 111/60  Pulse: 81 71 71 78  Resp: 18 18 18 18   Temp: 99.1 F (37.3 C) 98.3 F (36.8 C) 98.8 F (37.1 C) 98.9 F (37.2 C)  TempSrc: Oral Oral Oral Oral  SpO2: 91% 95% 94% 93%  Weight:        Intake/Output Summary (Last 24 hours) at 02/22/16 1339 Last data filed at 02/22/16 0904  Gross per 24 hour  Intake              240 ml  Output  0 ml  Net              240 ml   Filed Weights   02/19/16 1300 02/19/16 1403  Weight: 64.9 kg (143 lb 1.3 oz) 64.9 kg (143 lb 1.3 oz)    Examination:  General exam: Appears calm and comfortable, tearful   Respiratory system: Clear to auscultation. Respiratory effort normal. Cardiovascular system: S1 & S2 heard, RRR. No JVD, murmurs, rubs, gallops or clicks. No pedal  edema. Gastrointestinal system: Abdomen is nondistended, soft and nontender. No organomegaly or masses felt. Normal bowel sounds heard. Central nervous system: Alert and oriented to self and place but not time. No focal neurological deficits. No gross CN 2-12 deficits.  Extremities: Bilateral upper extremities are 2/5 in strength, bilateral lower extremities are 4/5 in strength. Hand grip strong and equal  Skin: No rashes, lesions or ulcers Psychiatry: Judgement and insight appear normal. Mood & affect appropriate.   Data Reviewed: I have personally reviewed following labs and imaging studies  CBC:  Recent Labs Lab 02/19/16 1337 02/19/16 1340 02/21/16 0652 02/22/16 0921  WBC 9.0  --  8.6 7.1  NEUTROABS 7.3  --  6.7 5.3  HGB 14.0 14.6 12.4 11.9*  HCT 41.7 43.0 37.9 36.9  MCV 90.5  --  92.9 92.5  PLT 231  --  205 187   Basic Metabolic Panel:  Recent Labs Lab 02/19/16 1337 02/19/16 1340 02/21/16 0652 02/22/16 0921  NA 138 138 137 136  K 4.5 4.4 4.0 3.8  CL 101 97* 103 102  CO2 27  --  26 26  GLUCOSE 156* 149* 101* 191*  BUN 20 22* 20 18  CREATININE 0.94 0.80 0.81 0.82  CALCIUM 9.7  --  9.0 8.9   GFR: Estimated Creatinine Clearance: 65.3 mL/min (by C-G formula based on SCr of 0.82 mg/dL). Liver Function Tests:  Recent Labs Lab 02/19/16 1337  AST 21  ALT 5*  ALKPHOS 82  BILITOT 1.7*  PROT 6.6  ALBUMIN 4.3   No results for input(s): LIPASE, AMYLASE in the last 168 hours. No results for input(s): AMMONIA in the last 168 hours. Coagulation Profile:  Recent Labs Lab 02/19/16 1337  INR 1.05   Cardiac Enzymes:  Recent Labs Lab 02/19/16 2009  TROPONINI <0.03   BNP (last 3 results) No results for input(s): PROBNP in the last 8760 hours. HbA1C:  Recent Labs  02/20/16 0324  HGBA1C 5.4   CBG: No results for input(s): GLUCAP in the last 168 hours. Lipid Profile:  Recent Labs  02/20/16 0324  CHOL 189  HDL 52  LDLCALC 127*  TRIG 51  CHOLHDL 3.6    Thyroid Function Tests: No results for input(s): TSH, T4TOTAL, FREET4, T3FREE, THYROIDAB in the last 72 hours. Anemia Panel: No results for input(s): VITAMINB12, FOLATE, FERRITIN, TIBC, IRON, RETICCTPCT in the last 72 hours. Sepsis Labs: No results for input(s): PROCALCITON, LATICACIDVEN in the last 168 hours.  Recent Results (from the past 240 hour(s))  MRSA PCR Screening     Status: None   Collection Time: 02/20/16  8:00 AM  Result Value Ref Range Status   MRSA by PCR NEGATIVE NEGATIVE Final    Comment:        The GeneXpert MRSA Assay (FDA approved for NASAL specimens only), is one component of a comprehensive MRSA colonization surveillance program. It is not intended to diagnose MRSA infection nor to guide or monitor treatment for MRSA infections.  Radiology Studies: Ct Head Wo Contrast  Result Date: 02/21/2016 CLINICAL DATA:  History of Parkinson's disease. Follow-up stroke. Status post aneurysm clipping. EXAM: CT HEAD WITHOUT CONTRAST TECHNIQUE: Contiguous axial images were obtained from the base of the skull through the vertex without intravenous contrast. COMPARISON:  Head CT 02/19/2016 FINDINGS: Brain: Area of hypoattenuation in the right frontal periventricular white matter is unchanged. There is no acute hemorrhage. No midline shift or mass effect. No evidence of acute cortical infarct. Vascular: Aneurysm clip present over glue across Lewis is again noted. Skull: Status post remote right pterional craniectomy. Sinuses/Orbits: No fluid levels or advanced mucosal thickening. Normal orbits. Other: None IMPRESSION: 1. No acute hemorrhage or evidence of acute cortical infarct. 2. Unchanged right frontal encephalomalacia and postsurgical changes of prior aneurysm clipping. Electronically Signed   By: Deatra Robinson M.D.   On: 02/21/2016 06:56        Scheduled Meds: . atorvastatin  20 mg Oral q1800  . carbidopa-levodopa  1 tablet Oral QHS  . carbidopa-levodopa   2 tablet Oral TID PC  . clopidogrel  75 mg Oral Daily  . Droxidopa  600 mg Oral TID  . enoxaparin (LOVENOX) injection  40 mg Subcutaneous Q24H  . fludrocortisone  0.1 mg Oral Daily  . fludrocortisone  0.2 mg Oral Daily  . fosfomycin  3 g Oral Once  . latanoprost  1 drop Both Eyes QHS  . mirabegron ER  25 mg Oral Daily  . pramipexole  0.25 mg Oral TID   Continuous Infusions:    LOS: 0 days    Time spent: 30 minutes     Noralee Stain, DO [facility] Pager [phone]  If 7PM-7AM, please contact night-coverage [email or web address] Password [email or web address] 02/22/2016, 1:39 PM

## 2016-02-22 NOTE — Progress Notes (Signed)
Patient will DC to SNF as facilitated by CSW.

## 2016-02-22 NOTE — Clinical Social Work Placement (Signed)
   CLINICAL SOCIAL WORK PLACEMENT  NOTE  Date:  02/22/2016  Patient Details  Name: Elizabeth Mcintosh MRN: [id number] Date of Birth: 12/04/1945  Clinical Social Work is seeking post-discharge placement for this patient at the Skilled  Nursing Facility level of care (*CSW will initial, date and re-position this form in  chart as items are completed):  Yes   Patient/family provided with [place] Clinical Social Work Department's list of facilities offering this level of care within the geographic area requested by the patient (or if unable, by the patient's family).  Yes   Patient/family informed of their freedom to choose among providers that offer the needed level of care, that participate in Medicare, Medicaid or managed care program needed by the patient, have an available bed and are willing to accept the patient.  Yes   Patient/family informed of [facility]'s ownership interest in [facility] and [facility], as well as of the fact that they are under no obligation to receive care at these facilities.  PASRR submitted to EDS on 02/22/16     PASRR number received on 02/22/16     Existing PASRR number confirmed on       FL2 transmitted to all facilities in geographic area requested by pt/family on 02/22/16     FL2 transmitted to all facilities within larger geographic area on       Patient informed that his/her managed care company has contracts with or will negotiate with certain facilities, including the following:        Yes   Patient/family informed of bed offers received.  Patient chooses bed at [facility]     Physician recommends and patient chooses bed at      Patient to be transferred to [facility] on 02/22/16.  Patient to be transferred to facility by ptar     Patient family notified on 02/22/16 of transfer.  Name of family member notified:  abby     PHYSICIAN Please sign FL2     Additional Comment:     _______________________________________________ Burna SisUris, Torey Regan H, LCSW 02/22/2016, 3:40 PM

## 2016-02-22 NOTE — Discharge Summary (Signed)
Physician Discharge Summary  Elizabeth Mcintosh:[id number] DOB: 02/27/1946 DOA: 02/19/2016  PCP: Gwen Pounds, MD  Admit date: 02/19/2016 Discharge date: 02/22/2016  Admitted From: Home Disposition: SNF  Recommendations for Outpatient Follow-up:  1. Follow up with PCP in 1-2 weeks 2. Follow-up with neurology 9/19 3. 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 She was evaluated by neurology in the ED and was admitted for stroke work up. Due to her prior aneurysm clipping, she is unable to undergo MRI. Patient was transferred to stepdown unit after hypotensive and unresponsive episode following medication administration by her husband (florinef and carbidopa). Rapid response was called and patient's BP improved with fluid bolus. Neurology has been following the case. Patient had aphasia and intermittent encephalopathy. As patient could not undergo MRI, repeat head CT was completed which showed unchanged encephalomalacia without any hemorrhage or acute infarct. It was thought that her symptoms may be related to a TIA versus other. She was started on Plavix as well as Lipitor. She is encouraged to follow up with her neurologist, Dr. Arbutus Leas on 9/19. She was evaluated by PT/OT/SLP. It was deemed that she was unsafe to be discharged home as she had significant weakness and decline from her baseline. She was discharged to skilled nursing facility. Additionally, patient had cystitis during her admission. It is unclear if she had this prior to admission or not. Urine culture at the time is pending. She was given 1 dose of  fosfomycin in the hospital. Urine culture should be followed up.  Discharge Diagnoses:  Principal Problem:   Aphasia Active Problems:   Akinetic rigid Parkinsons disease   Autonomic postural hypotension   Multiple system atrophy, Parkinson variant (HCC)   Cerebral infarction due to unspecified mechanism   HLD (hyperlipidemia)   Acute encephalopathy   Cystitis  Aphasia and intermittent encephalopathy  -Unclear etiology, TIA vs seizure vs metabolic vs infectious  -Neurology/Stroke team consulted -Hemoglobin A1c 5.4 -CTA head and neck: atherosclerosis at right cavernous ICA, right MCA, left A2, right P2 -PT/OT/SLP evaluation -Plavix daily - will be discharged with plavix monotherapy   -Start lipitor 20mg  - will be discharge with this  -EEG: nonspecific moderate diffuse slowing, focal slowing right temporal region -Repeat CT head: unchanged encephalomalacia, no hemorrhage or acute infarct  -Echocardiogram: unremarkable  -Need neuro follow up with Dr. Arbutus Leas 9/19 -Started lipitor 20mg , plavix 75mg  this admission   Cystitis, unclear if prior to admission, unknown bacteria -Urine culture pending -Fosfomycin x 1   Akinetic rigid Parkinsons disease/Multiple system atrophy, Parkinson variant  -Follows Dr. Arbutus Leas at [facility] -Continue carbidopa/levodopa  -Continue Mirapex -Continue Myrbetriqfor Parkinson's related bladder issues  Autonomic postural hypotension -Continue Florinef and Droxidopa  Consultants:   Neurology   Procedures:   EEG 9/13: This awake and asleepEEG is abnormal due to the presence of:             1. Moderatediffuse slowing of the waking background             2. Additional focal slowing over the right hemisphere, maximal over the right temporal region  Antimicrobials:   None    Discharge Instructions  Discharge Instructions    Diet - low  sodium heart healthy    Complete by:  As directed    Increase activity slowly    Complete by:  As  directed        Medication List    STOP taking these medications   aspirin 81 MG tablet     TAKE these medications   AMBULATORY NON FORMULARY MEDICATION 1 Device by Does not apply route daily. Merry Walker   atorvastatin 20 MG tablet Commonly known as:  LIPITOR Take 1 tablet (20 mg total) by mouth daily at 6 PM.   bimatoprost 0.01 % Soln Commonly known as:  LUMIGAN Place 1 drop into both eyes at bedtime.   carbidopa-levodopa 25-100 MG tablet Commonly known as:  SINEMET IR TAKE 2 TABLETS IN THE AM, 1 TABLET AT NOON, 1 IN THE EVENING What changed:  See the new instructions.   carbidopa-levodopa 50-200 MG tablet Commonly known as:  SINEMET CR TAKE 1 TABLET BY MOUTH AT BEDTIME What changed:  Another medication with the same name was changed. Make sure you understand how and when to take each.   clopidogrel 75 MG tablet Commonly known as:  PLAVIX Take 1 tablet (75 mg total) by mouth daily. Start taking on:  02/23/2016   diphenhydrAMINE 50 MG capsule Commonly known as:  BENADRYL Take 50 mg by mouth at bedtime.   Droxidopa 300 MG Caps Take 2 tablets three times a day What changed:  how much to take  how to take this  when to take this  additional instructions   fludrocortisone 0.1 MG tablet Commonly known as:  FLORINEF Take 2 tablets (0.2 mg total) by mouth daily. What changed:  how much to take  when to take this  additional instructions   fluticasone 50 MCG/ACT nasal spray Commonly known as:  FLONASE Place 2 sprays into both nostrils at bedtime as needed.   LYSINE PO Take 1 tablet by mouth daily.   Melatonin 5 MG Tabs Take 5 mg by mouth at bedtime.   MYRBETRIQ 25 MG Tb24 tablet Generic drug:  mirabegron ER Take 25 mg by mouth at bedtime.   Potassium Chloride ER 20 MEQ Tbcr Take 20 mEq by mouth daily.   pramipexole 0.25 MG tablet Commonly known as:  MIRAPEX Take 1 tablet (0.25 mg total) by mouth 3 (three) times daily. What changed:  how  much to take  when to take this      Follow-up Information    TAT, REBECCA, DO. Go on 02/26/2016.   Specialty:  Neurology Contact information: [address]  [address] [place] [address] [phone]        Inc. - Dme [facility] .   Why:  Rolling walker to be delivered to roomprior to [employer] Contact information: [address] [place] [address] [phone]        [facility] .   Why:  Home Health PT OT. They will call in next 24 to 48 hours to [facility] Contact information: [address] [place] [address] [phone]        Gwen PoundsUSSO,[name], MD. Schedule an appointment as soon as possible for a visit in 1 week(s).   Specialty:  Internal Medicine Contact information: [address] [place] [address] [phone]          Allergies  Allergen Reactions  . Penicillins Rash    *childhood allergy* Has patient had a PCN reaction causing immediate rash, facial/tongue/throat swelling, SOB or  lightheadedness with hypotension: Yes Has patient had a PCN reaction causing severe rash involving mucus membranes or skin necrosis: No Has patient had a PCN reaction that required hospitalization No Has patient had a PCN reaction occurring within the last 10 years: No If all of the above answers are "NO", then may proceed with Cephalosporin use.     Consultations:  Neurology   Procedures/Studies: Ct Angio Head W Or Wo Contrast  Result Date: 02/19/2016 CLINICAL DATA:  Only done with were interested gland with a with a of gap about the there are out flattened of the sella at the end there 5 months images a we keep at the 7 -with foot could mass EXAM: CT ANGIOGRAPHY HEAD AND NECK TECHNIQUE: Multidetector CT imaging of the head and neck was performed using the standard protocol during bolus administration of intravenous contrast. Multiplanar CT image reconstructions and MIPs were obtained to evaluate  the vascular anatomy. Carotid stenosis measurements (when applicable) are obtained utilizing NASCET criteria, using the distal internal carotid diameter as the denominator. CONTRAST:  50 cc of Isovue COMPARISON:  Prior CT from earlier the same day as well was prior CT 11/13/2014. FINDINGS: CTA NECK Aortic arch: Visualized aortic arch of normal caliber with normal 3 vessel morphology. Mild atheromatous plaque within the aortic arch in at the origin of the great vessels without flow limiting stenosis. Visualized subclavian arteries are widely patent. Right carotid system: Right common carotid artery patent from its origin to the bifurcation. Mild atheromatous plaque about the right bifurcation with associated narrowing of up to 20% by NASCET criteria. This has mildly progressed from previous. Right ICA patent distally to the skullbase without stenosis, dissection, or occlusion. Right external carotid artery and its branches within normal limits. Left carotid system: Left common carotid artery patent from its origin to the bifurcation. Minimal a centric plaque about the left bifurcation without stenosis. Left ICA patent distally without stenosis, dissection, or occlusion. Left external carotid artery and its branches within normal limits. Vertebral arteries:Both vertebral arteries arise from the subclavian arteries. Vertebral arteries are largely code dominant. Atheromatous plaque at the origin of the right vertebral artery with approximately 50% narrowing. Vertebral arteries otherwise widely patent within the neck without stenosis, dissection, or occlusion. Skeleton: No acute osseous abnormality. No worrisome lytic or blastic osseous lesions. Other neck: Mild atelectatic changes within the visualized lungs. Visualized lungs are otherwise clear. Mediastinum within normal limits. Thyroid normal. No adenopathy within the neck. No acute soft tissue abnormality. CTA HEAD Anterior circulation: Petrous segments patent  bilaterally without stenosis. Multifocal atheromatous plaque within the cavernous ICAs bilaterally, slightly worse on the right. There is associated moderate multi focal narrowing of up to 50%, most prevalent at the supraclinoid right ICA. A1 segments are patent. Surgical clip present at the right aspect of the anterior communicating artery complex. No residual aneurysm. Anterior cerebral arteries patent distally but demonstrate multifocal atheromatous irregularity, similar to previous. There is a superimposed relatively severe stenosis at the proximal left A2 segment (series 10, image 23). Moderate narrowing of approximately 50% within the proximal right M1 segment. Right M1 segment patent distally. Right MCA bifurcation normal. No proximal M2 occlusion. Right MCA branches opacified to their distal aspects. Left M1 segment mildly irregular without significant stenosis. Left MCA bifurcation normal. Distal left MCA branches well opacified to their distal aspects. Posterior circulation: Vertebral arteries patent to the vertebrobasilar junction. Mild atheromatous irregularity in the distal right V4 segment without significant stenosis. Basilar artery widely patent. Superior cerebral  arteries patent bilaterally. Both of the posterior cerebral arteries arise from the basilar artery. Left PCA well opacified and patent to its distal aspect. There is a severe short-segment stenosis right P2 segment (series 8, image 116). This is similar to previous. Venous sinuses: Contents. Anatomic variants: No significant anatomic variant. No new aneurysm identified. Delayed phase: No pathologic enhancement. IMPRESSION: CTA NECK IMPRESSION: 1. Mild atherosclerotic changes within the aortic arch and carotid bifurcations bilaterally without high-grade or significant stenosis. Irregularity greatest at the right carotid bifurcation were there is associated 20% narrowing by NASCET criteria, mildly progressed relative to 2016. 2. Atheromatous  plaque at the origin of the right vertebral artery with approximately 50% stenosis. Vertebral arteries otherwise widely patent within the neck. CTA HEAD IMPRESSION: 1. Atheromatous plaque throughout the anterior circulation, overall relatively similar to previous exam. Changes greatest within the carotid siphons, proximal right right M1 segment, and anterior cerebral arteries. Narrowing at the supraclinoid right ICA estimated at 50% with additional 50% stenosis proximal right M1 segment. Short-segment severe proximal left A2 stenosis. 2. Short segment severe right P2 stenosis, similar. No other high-grade stenosis within the posterior circulation. 3. Sequelae of prior aneurysm clipping and/or coiling at the right anterior communicating artery complex. No evidence for recurrent aneurysm. Electronically Signed   By: Rise Mu M.D.   On: 02/19/2016 21:56   Ct Head Wo Contrast  Result Date: 02/21/2016 CLINICAL DATA:  History of Parkinson's disease. Follow-up stroke. Status post aneurysm clipping. EXAM: CT HEAD WITHOUT CONTRAST TECHNIQUE: Contiguous axial images were obtained from the base of the skull through the vertex without intravenous contrast. COMPARISON:  Head CT 02/19/2016 FINDINGS: Brain: Area of hypoattenuation in the right frontal periventricular white matter is unchanged. There is no acute hemorrhage. No midline shift or mass effect. No evidence of acute cortical infarct. Vascular: Aneurysm clip present over glue across Lewis is again noted. Skull: Status post remote right pterional craniectomy. Sinuses/Orbits: No fluid levels or advanced mucosal thickening. Normal orbits. Other: None IMPRESSION: 1. No acute hemorrhage or evidence of acute cortical infarct. 2. Unchanged right frontal encephalomalacia and postsurgical changes of prior aneurysm clipping. Electronically Signed   By: Deatra Robinson M.D.   On: 02/21/2016 06:56   Ct Angio Neck W Or Wo Contrast  Result Date: 02/19/2016 CLINICAL  DATA:  Only done with were interested gland with a with a of gap about the there are out flattened of the sella at the end there 5 months images a we keep at the 7 -with foot could mass EXAM: CT ANGIOGRAPHY HEAD AND NECK TECHNIQUE: Multidetector CT imaging of the head and neck was performed using the standard protocol during bolus administration of intravenous contrast. Multiplanar CT image reconstructions and MIPs were obtained to evaluate the vascular anatomy. Carotid stenosis measurements (when applicable) are obtained utilizing NASCET criteria, using the distal internal carotid diameter as the denominator. CONTRAST:  50 cc of Isovue COMPARISON:  Prior CT from earlier the same day as well was prior CT 11/13/2014. FINDINGS: CTA NECK Aortic arch: Visualized aortic arch of normal caliber with normal 3 vessel morphology. Mild atheromatous plaque within the aortic arch in at the origin of the great vessels without flow limiting stenosis. Visualized subclavian arteries are widely patent. Right carotid system: Right common carotid artery patent from its origin to the bifurcation. Mild atheromatous plaque about the right bifurcation with associated narrowing of up to 20% by NASCET criteria. This has mildly progressed from previous. Right ICA patent distally to the skullbase without  stenosis, dissection, or occlusion. Right external carotid artery and its branches within normal limits. Left carotid system: Left common carotid artery patent from its origin to the bifurcation. Minimal a centric plaque about the left bifurcation without stenosis. Left ICA patent distally without stenosis, dissection, or occlusion. Left external carotid artery and its branches within normal limits. Vertebral arteries:Both vertebral arteries arise from the subclavian arteries. Vertebral arteries are largely code dominant. Atheromatous plaque at the origin of the right vertebral artery with approximately 50% narrowing. Vertebral arteries  otherwise widely patent within the neck without stenosis, dissection, or occlusion. Skeleton: No acute osseous abnormality. No worrisome lytic or blastic osseous lesions. Other neck: Mild atelectatic changes within the visualized lungs. Visualized lungs are otherwise clear. Mediastinum within normal limits. Thyroid normal. No adenopathy within the neck. No acute soft tissue abnormality. CTA HEAD Anterior circulation: Petrous segments patent bilaterally without stenosis. Multifocal atheromatous plaque within the cavernous ICAs bilaterally, slightly worse on the right. There is associated moderate multi focal narrowing of up to 50%, most prevalent at the supraclinoid right ICA. A1 segments are patent. Surgical clip present at the right aspect of the anterior communicating artery complex. No residual aneurysm. Anterior cerebral arteries patent distally but demonstrate multifocal atheromatous irregularity, similar to previous. There is a superimposed relatively severe stenosis at the proximal left A2 segment (series 10, image 23). Moderate narrowing of approximately 50% within the proximal right M1 segment. Right M1 segment patent distally. Right MCA bifurcation normal. No proximal M2 occlusion. Right MCA branches opacified to their distal aspects. Left M1 segment mildly irregular without significant stenosis. Left MCA bifurcation normal. Distal left MCA branches well opacified to their distal aspects. Posterior circulation: Vertebral arteries patent to the vertebrobasilar junction. Mild atheromatous irregularity in the distal right V4 segment without significant stenosis. Basilar artery widely patent. Superior cerebral arteries patent bilaterally. Both of the posterior cerebral arteries arise from the basilar artery. Left PCA well opacified and patent to its distal aspect. There is a severe short-segment stenosis right P2 segment (series 8, image 116). This is similar to previous. Venous sinuses: Contents. Anatomic  variants: No significant anatomic variant. No new aneurysm identified. Delayed phase: No pathologic enhancement. IMPRESSION: CTA NECK IMPRESSION: 1. Mild atherosclerotic changes within the aortic arch and carotid bifurcations bilaterally without high-grade or significant stenosis. Irregularity greatest at the right carotid bifurcation were there is associated 20% narrowing by NASCET criteria, mildly progressed relative to 2016. 2. Atheromatous plaque at the origin of the right vertebral artery with approximately 50% stenosis. Vertebral arteries otherwise widely patent within the neck. CTA HEAD IMPRESSION: 1. Atheromatous plaque throughout the anterior circulation, overall relatively similar to previous exam. Changes greatest within the carotid siphons, proximal right right M1 segment, and anterior cerebral arteries. Narrowing at the supraclinoid right ICA estimated at 50% with additional 50% stenosis proximal right M1 segment. Short-segment severe proximal left A2 stenosis. 2. Short segment severe right P2 stenosis, similar. No other high-grade stenosis within the posterior circulation. 3. Sequelae of prior aneurysm clipping and/or coiling at the right anterior communicating artery complex. No evidence for recurrent aneurysm. Electronically Signed   By: Rise Mu M.D.   On: 02/19/2016 21:56   Ct Head Code Stroke W/o Cm  Result Date: 02/19/2016 CLINICAL DATA:  Code stroke.  Sudden onset aphasia. EXAM: CT HEAD WITHOUT CONTRAST TECHNIQUE: Contiguous axial images were obtained from the base of the skull through the vertex without intravenous contrast. 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 Encephalomalacia in the anterior right temporal and inferior right frontal lobes and anterior right basal ganglia is unchanged. Ex vacuo enlargement of the right frontal horn is unchanged. There is a chronic  lacunar infarct in the left caudate head. There is no evidence of acute cortical infarct, intracranial hemorrhage, mass, midline shift, or extra-axial fluid collection. Orbits are unremarkable. Paranasal sinuses and mastoid air cells are clear. Calcified atherosclerosis is noted at the skullbase. ASPECTS [facility] Stroke Program Early CT Score) - Ganglionic level infarction (caudate, lentiform nuclei, internal capsule, insula, M1-M3 cortex): 7 - Supraganglionic infarction (M4-M6 cortex): 3 Total score (0-10 with 10 being normal): 10 IMPRESSION: 1. No evidence of acute intracranial abnormality. 2. ASPECTS is 10. 3. Unchanged encephalomalacia in the right cerebral hemisphere. Chronic left caudate lacunar infarct. These results were called by telephone at the time of interpretation on 02/19/2016 at 2:01 pm to Dr. Amada Jupiter, who verbally acknowledged these results. Electronically Signed   By: Sebastian Ache M.D.   On: 02/19/2016 14:02  Echo - Left ventricle: The cavity size was normal. There was mild focal   basal hypertrophy of the septum. Systolic function was normal.   The estimated ejection fraction was in the range of 55% to 60%.   Wall motion was normal; there were no regional wall motion   abnormalities. Left ventricular diastolic function parameters   were normal.   EEG Impression: This awake and asleep EEG is abnormal due to the presence of: 1. Moderate diffuse slowing of the waking background 2. Additional focal slowing over the right hemisphere, maximal  over the right temporal region   Subjective: Patient quite tearful today. She states that she wants to go home but does not have any other specific complaints.   Discharge Exam: Vitals:   02/22/16 0420 02/22/16 1459  BP: 111/60 (!) 101/52  Pulse: 78 73  Resp: 18 18  Temp: 98.9 F (37.2 C) 97.5 F (36.4 C)   Vitals:   02/21/16 2041 02/22/16 0007 02/22/16 0420 02/22/16 1459  BP: (!) 90/48 (!) 96/47 111/60 (!) 101/52  Pulse: 71  71 78 73  Resp: 18 18 18 18   Temp: 98.3 F (36.8 C) 98.8 F (37.1 C) 98.9 F (37.2 C) 97.5 F (36.4 C)  TempSrc: Oral Oral Oral   SpO2: 95% 94% 93% 97%  Weight:       General exam: Appears calm and comfortable, tearful   Respiratory system: Clear to auscultation. Respiratory effort normal. Cardiovascular system: S1 & S2 heard, RRR. No JVD, murmurs, rubs, gallops or clicks. No pedal edema. Gastrointestinal system: Abdomen is nondistended, soft and nontender. No organomegaly or masses felt. Normal bowel sounds heard. Central nervous system: Alert and oriented to self and place but not time. No focal neurological deficits. No gross CN 2-12 deficits.  Extremities: Bilateral upper extremities are 2/5 in strength, bilateral lower extremities are 4/5 in strength. Hand grip strong and equal  Skin: No rashes, lesions or ulcers Psychiatry: Judgement and insight appear normal. Mood & affect appropriate.    The results of significant diagnostics from this hospitalization (including imaging, microbiology, ancillary and laboratory) are listed below for reference.     Microbiology: Recent Results (from the past 240 hour(s))  MRSA PCR Screening     Status: None   Collection Time: 02/20/16  8:00 AM  Result Value Ref Range Status   MRSA by PCR NEGATIVE NEGATIVE Final    Comment:  The GeneXpert MRSA Assay (FDA approved for NASAL specimens only), is one component of a comprehensive MRSA colonization surveillance program. It is not intended to diagnose MRSA infection nor to guide or monitor treatment for MRSA infections.      Labs: BNP (last 3 results) No results for input(s): BNP in the last 8760 hours. Basic Metabolic Panel:  Recent Labs Lab 02/19/16 1337 02/19/16 1340 02/21/16 0652 02/22/16 0921  NA 138 138 137 136  K 4.5 4.4 4.0 3.8  CL 101 97* 103 102  CO2 27  --  26 26  GLUCOSE 156* 149* 101* 191*  BUN 20 22* 20 18  CREATININE 0.94 0.80 0.81 0.82  CALCIUM 9.7  --   9.0 8.9   Liver Function Tests:  Recent Labs Lab 02/19/16 1337  AST 21  ALT 5*  ALKPHOS 82  BILITOT 1.7*  PROT 6.6  ALBUMIN 4.3   No results for input(s): LIPASE, AMYLASE in the last 168 hours. No results for input(s): AMMONIA in the last 168 hours. CBC:  Recent Labs Lab 02/19/16 1337 02/19/16 1340 02/21/16 0652 02/22/16 0921  WBC 9.0  --  8.6 7.1  NEUTROABS 7.3  --  6.7 5.3  HGB 14.0 14.6 12.4 11.9*  HCT 41.7 43.0 37.9 36.9  MCV 90.5  --  92.9 92.5  PLT 231  --  205 187   Cardiac Enzymes:  Recent Labs Lab 02/19/16 2009  TROPONINI <0.03   BNP: Invalid input(s): POCBNP CBG: No results for input(s): GLUCAP in the last 168 hours. D-Dimer No results for input(s): DDIMER in the last 72 hours. Hgb A1c  Recent Labs  02/20/16 0324  HGBA1C 5.4   Lipid Profile  Recent Labs  02/20/16 0324  CHOL 189  HDL 52  LDLCALC 127*  TRIG 51  CHOLHDL 3.6   Thyroid function studies No results for input(s): TSH, T4TOTAL, T3FREE, THYROIDAB in the last 72 hours.  Invalid input(s): FREET3 Anemia work up No results for input(s): VITAMINB12, FOLATE, FERRITIN, TIBC, IRON, RETICCTPCT in the last 72 hours. Urinalysis    Component Value Date/Time   COLORURINE AMBER (A) 02/21/2016 2148   APPEARANCEUR TURBID (A) 02/21/2016 2148   LABSPEC 1.023 02/21/2016 2148   PHURINE 6.0 02/21/2016 2148   GLUCOSEU NEGATIVE 02/21/2016 2148   HGBUR MODERATE (A) 02/21/2016 2148   BILIRUBINUR NEGATIVE 02/21/2016 2148   KETONESUR 15 (A) 02/21/2016 2148   PROTEINUR 30 (A) 02/21/2016 2148   UROBILINOGEN 0.2 05/10/2014 1108   NITRITE NEGATIVE 02/21/2016 2148   LEUKOCYTESUR LARGE (A) 02/21/2016 2148   Sepsis Labs Invalid input(s): PROCALCITONIN,  WBC,  LACTICIDVEN Microbiology Recent Results (from the past 240 hour(s))  MRSA PCR Screening     Status: None   Collection Time: 02/20/16  8:00 AM  Result Value Ref Range Status   MRSA by PCR NEGATIVE NEGATIVE Final    Comment:        The  GeneXpert MRSA Assay (FDA approved for NASAL specimens only), is one component of a comprehensive MRSA colonization surveillance program. It is not intended to diagnose MRSA infection nor to guide or monitor treatment for MRSA infections.      Time coordinating discharge: Over 30 minutes  SIGNED:  Noralee Stain, DO [facility] Pager [phone]  If 7PM-7AM, please contact night-coverage [email or web address] Password [facility] 02/22/2016, 3:27 PM

## 2016-02-22 NOTE — Care Management Note (Addendum)
Case Management Note  Patient Details  Name: Sherlyn LickMary E Hamid MRN: [id number] Date of Birth: 01/25/1946  Subjective/Objective:                 Spoke with patient and husband in room. [facility]. Discussed DME needs with patient and husband, RW ordered to room prior to discharge. DC expected today. Referral texted to Lupita LeashDonna, [facility].    Action/Plan:  HH and DME RW. Will DC to home with husband Expected Discharge Date:                  Expected Discharge Plan:  Home w Home Health Services  In-House Referral:  NA  Discharge planning Services  CM Consult  Post Acute Care Choice:  Durable Medical Equipment, Home Health Choice offered to:  Patient  DME Arranged:  [place] rolling DME Agency:  [facility]  [facility] Arr[facility]  [facility]  Status of Service:  Completed, signed off  If discussed at Long Length of Stay Meetings, dates discussed:    Additional Comments:  Lawerance SabalDebbie Chestina Komatsu, RN 02/22/2016, 12:22 PM

## 2016-02-22 NOTE — Progress Notes (Signed)
[facility] Anticipated discharge date: 9/15 Family notified: [employer] by [employer]- called at 5:15pm  CSW signing off.  Burna SisJenna H. Tyrek Lawhorn, LCSWA Clinical Social Worker [phone]

## 2016-02-22 NOTE — Evaluation (Signed)
Speech Language Pathology Evaluation Patient Details Name: Elizabeth Mcintosh MRN: [id number] DOB: 02/09/1946 Today's Date: 02/22/2016 Time:  -     Problem List:  Patient Active Problem List   Diagnosis Date Noted  . Cystitis 02/22/2016  . Acute encephalopathy 02/21/2016  . HLD (hyperlipidemia) 02/20/2016  . Aphasia 02/19/2016  . Cerebral infarction due to unspecified mechanism   . 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 Depression 11/22/2013  . 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 Aneurysm (HCC)    s/p clips  . Glaucoma   . Hypotension   . Parkinson's disease (HCC)   . Sleep apnea    Past Surgical History:  Past Surgical History:  Procedure Laterality Date  . aneurym clipping    . 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 Husband reported that in the  morning around 8:30 AM he got his wife out of bed as usual but her to the chair and went downstairs to make her breakfast. Just about one hour later he came back to check on her to get her breakfast and noticed she was unable to talk and she was essentially only mumbling. Head CT x2 has not shown acute infarct. Cannot have MRI. Neurologist most recent note reports cogwheeling and bradykinesia, but intact language, no focal neurological deficit.    Assessment / Plan / Recommendation Clinical Impression  Pt demonstrates mild dysnomia impacting fluency at conversation level. Word finding difficulty is inconsistent with pt occasionally successfully naming obscure relatives or greek words and other times needing over a minute and verbal cues to name three animals. Given pts reliance of verbal  expression for quality of life, [facility] verbal fluency exercises. Pt and family in agreement.     SLP Assessment  All further Speech Lanaguage Pathology  needs can be addressed in the next venue of care    Follow Up Recommendations  Home health SLP    Frequency and Duration           SLP Evaluation Cognition  Overall Cognitive Status: Within Functional Limits for tasks assessed       Comprehension  Auditory Comprehension Overall Auditory Comprehension: Appears within functional limits for tasks assessed    Expression Verbal Expression Overall Verbal Expression: Impaired Initiation: No impairment Automatic Speech: Name;Social Response Level of Generative/Spontaneous Verbalization: Conversation Repetition: No impairment Naming: Impairment Responsive: 76-100% accurate Confrontation: Impaired Convergent: Not tested Divergent: 75-100% accurate (but needs extra time and verbal cues) Verbal Errors: Aware of errors Pragmatics: No impairment   Oral / Motor  Oral Motor/Sensory Function Overall Oral Motor/Sensory Function: Within functional limits Motor Speech Overall Motor Speech: Appears within functional limits for tasks assessed   GO          Functional Assessment Tool Used: clinical judgement Functional Limitations: Spoken language expressive Spoken Language Expression Current Status ([id number](G9162): At least 1 percent but less than 20 percent impaired, limited or restricted Spoken Language Expression Goal Status [phone](G9163): At least 1 percent but less than 20 percent impaired, limited or restricted Spoken Language Expression Discharge Status [phone](G9164): At least 1 percent but less than 20 percent impaired, limited or restricted        Harlon DittyBonnie Avenly Roberge, MA CCC-SLP [phone]  Felicite Zeimet, Riley Nearing 02/22/2016, 3:08 PM

## 2016-02-22 NOTE — NC FL2 (Signed)
[place] MEDICAID FL2 LEVEL OF CARE SCREENING TOOL     IDENTIFICATION  Patient Name: Elizabeth Mcintosh Birthdate: 05-Feb-1946 Sex: female Admission Date (Current Location): 02/19/2016  [facility] and [place] Number:  Producer, television/film/video and Address:  [facility] [place], [address], [place], [place] [address]      Provider Number: [id number]  Attending Physician Name and Address:  Jordan Hawks*  Relative Name and Phone Number:       Current Level of Care: Hospital Recommended Level of Care: Skilled Nursing Facility Prior Approval Number:    Date Approved/Denied:   PASRR Number:    Discharge Plan: SNF    Current Diagnoses: Patient Active Problem List   Diagnosis Date Noted  . Cystitis 02/22/2016  . Acute encephalopathy 02/21/2016  . HLD (hyperlipidemia) 02/20/2016  . Aphasia 02/19/2016  . Cerebral infarction due to unspecified mechanism   . 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 Depression 11/22/2013  . 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    Orientation RESPIRATION BLADDER Height & Weight     Self, Place  Normal Incontinent Weight: 143 lb 1.3 oz (64.9 kg) Height:     BEHAVIORAL SYMPTOMS/MOOD NEUROLOGICAL BOWEL NUTRITION STATUS      Continent Diet (regular)  AMBULATORY STATUS COMMUNICATION OF NEEDS Skin   Extensive Assist Verbally Normal                       Personal Care Assistance Level of Assistance  Bathing, Dressing Bathing Assistance: Maximum assistance   Dressing Assistance: Maximum assistance     Functional Limitations Info             SPECIAL CARE FACTORS FREQUENCY  PT (By licensed PT), OT (By licensed OT)     PT Frequency: 5/wk OT Frequency: 5/wk            Contractures      Additional Factors Info  Code Status, Allergies Code Status Info: FULL Allergies Info: Penicillins           Current Medications (02/22/2016):   This is the current hospital active medication list Current Facility-Administered Medications  Medication Dose Route Frequency Provider Last Rate Last Dose  . atorvastatin (LIPITOR) tablet 20 mg  20 mg Oral q1800 Marvel Plan, MD   20 mg at 02/21/16 1912  . carbidopa-levodopa (SINEMET CR) 50-200 MG per tablet controlled release 1 tablet  1 tablet Oral QHS Marvel Plan, MD   1 tablet at 02/21/16 2250  . carbidopa-levodopa (SINEMET IR) 25-100 MG per tablet immediate release 2 tablet  2 tablet Oral TID PC Marvel Plan, MD   2 tablet at 02/22/16 1241  . clopidogrel (PLAVIX) tablet 75 mg  75 mg Oral Daily Marvel Plan, MD   75 mg at 02/22/16 0932  . Droxidopa CAPS 600 mg  600 mg Oral TID Ozella Rocks, MD   600 mg at 02/22/16 0930  . enoxaparin (LOVENOX) injection 40 mg  40 mg Subcutaneous Q24H Russella Dar, NP   40 mg at 02/21/16 1728  . fludrocortisone (FLORINEF) tablet 0.1 mg  0.1 mg Oral Daily Jordan Hawks, DO   0.1 mg at 02/21/16 2000  . fludrocortisone (FLORINEF) tablet 0.2 mg  0.2 mg Oral Daily Russella Dar, NP   0.2 mg at 02/22/16 0931  . fosfomycin (MONUROL) packet 3 g  3 g Oral Once Kellogg, DO      .  latanoprost (XALATAN) 0.005 % ophthalmic solution 1 drop  1 drop Both Eyes QHS Russella DarAllison L Ellis, NP   1 drop at 02/21/16 2250  . mirabegron ER (MYRBETRIQ) tablet 25 mg  25 mg Oral Daily Russella DarAllison L Ellis, NP   25 mg at 02/21/16 2250  . pramipexole (MIRAPEX) tablet 0.25 mg  0.25 mg Oral TID Russella DarAllison L Ellis, NP   0.25 mg at 02/22/16 0932     Discharge Medications: Please see discharge summary for a list of discharge medications.  Relevant Imaging Results:  Relevant Lab Results:   Additional Information SS#: [id number]  Burna SisUris, Chloe Baig H, LCSW

## 2016-02-23 LAB — URINE CULTURE

## 2016-02-24 NOTE — Progress Notes (Signed)
Elizabeth Mcintosh was seen today in the movement disorders clinic for neurologic consultation at the request of [name],[name], MD.  The consultation is for the evaluation of abnormal gait and "awkwardness" and weakness on the R side.  Pt states that they were away in [place] and [place] in October, 2014 and she felt like she just didn't bounce back after that.  She just felt "slow" after.  In Jan, she went to ortho to get an injection in her shoulder b/c of an impingement syndrome but still felt slow after PT, although she did good in PT.  She tried to d/c all the meds that she thought that she thought would interfere and could cause slowness - benadryl and unisom.  She feels that her right arm doesn't swing.  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  11/22/13 update:  Pt returns today for f/u, accompanied by her daughter who supplements the history.  The patient was diagnosed with Parkinson's disease last visit.  We started levodopa.  The patient reports that she is moving easier.  Her friends noticed that she is more alert.  She did her physical therapies and is planning on starting a cardiovascular exercise program, but has not figured out how exactly to do that or what to do.  She and her daughter bring a long list of questions.  Her daughter asks me about recognition of depression.  The patient denies this, but her daughter states that she does not think that the patient would be able to recognize it, and her daughter thinks that there is some depression.  The patient is very active, especially in regards to travel.  She will be traveling to [place] in July and then will be traveling again overseas in October.  She asks me about refilling her Ambien for insomnia.  Her daughter asks me about drinking alcohol.  Last visit, the patient reported that she drank about 3 or 4 glasses of wine per night, each totaling 6-8 ounces.  No falls.  No hallucinations.  No lightheadedness.    02/22/14  update:  Pt accompanied by her son, who supplements the history.  Pt saw Dr. Conley Canal since last visit.  No evidence of dementia.  Believed had adjustment d/o related to dx of PD and pt agreed to go to counseling with Dr. Conley Canal.  Dr. Conley Canal also discussed with her tapering alcohol use, as I have also.  Pt states that she was told that she could still have 3 drinks per night.   She has increased her carbidopa/levodopa 25/100 to 2 in the AM, 1 in the afternoon, 1 in the evening.  Pt states that she has more energy after going up in the AM.  Her BP was really low today, and she states that it "is always low."  She is sometimes dizzy but it is not bad.  She is getting up in the middle of the night frequently to use the bathroom and cannot sleep.  She wonders if the klonopin contributes to that.  She is exercising more; went 5 days last week.    06/27/14 update:  The patient is following up today.  She is accompanied by her husband who supplements the history.  She is currently on carbidopa/levodopa 25/100, 2 tablets in the morning, one in the afternoon and one in the evening.  Pramipexole was started last visit and worked up to 0.5 g 3 times a day.  She stopped  this and ended up restarting it in November.  She has not noticed a huge difference with this medication.  She did call me in November with an episode of vision change where everything went bright.  It lasted for about 7-10 minutes and she felt somewhat dizzy.  She is not able to have an MRI because of prior aneurysm clips.  A CT of the brain was done and did not reveal any changes.  A carotid ultrasound demonstrated less than 50% stenosis bilaterally.  She was just evaluated for PT/OT/ST on 06/08/2014.  She has been doing a lot of travel - [place], [place], [place] and she did well with a lot of walking.  Her husband states that she did pretty well.  She is working out with a Systems analyst with at [employer].  No falls but feels that she is "lurching"  forward.  Mood is good.  Trouble sleeping.  States that Dr. Timothy Lasso increased her klonopin to 1.5 tablets at night.  Still drinking 2 glasses wine per night but that is down from previously.  10/26/14 update:  The patient is following up today, accompanied by her daughter who supplements the history.  She is on carbidopa/levodopa 25/100, 2 in the morning, one in the afternoon and one in the evening along with pramipexole, 0.5 mg 3 times per day.  She states that she misses one lunchtime pill a week because she forgets. Pt states that she fell 2 days ago.   She was quickly walking into the kitchen and fell into the drying rack in front of the washer and she fell into her shoulder.  No LOC.  Her husband helped her get back up.  Her daughter thinks that she had another fall not long after our last visit; she stepped outside on the ice and went out to get the paper and fell.  She has just finished PT/OT/ST and she states that she learned strategies and she learned exercises.  She has a Systems analyst at the [facility] that she sees once per week and she goes 3 other times throughout the week.  She is on a combination of clonazepam and 3 mg of melatonin for insomnia.  She states that is working well.  She states that her PCP started myrbetriq since last visit and it has helped her bladder.  She does state that she is having cramping of the feet and legs about 3 nights a week.  She continues to drink a glass of wine at lunchtime and 1 "small" glass of liquor in the evening.  She has developed some diplopia and is getting new glasses.  She states that she will not have prisms in the glasses.  12/28/14 update:  The patient is following up today.  She is accompanied by her son and daughter who supplement the history.   She is on carbidopa/levodopa 25/100, 2 tablets in the morning, one in the afternoon and one in the evening in addition to pramipexole, 0.5 mg 3 times per day.  Last visit, I also added carbidopa/levodopa 50/200 at  night because she was complaining about toe curling.  This really helped that problem.   Since our last visit, however, there have been multiple calls from the patient as well as from one of her physician friends complaining about "spells."  These spells will consist of having fragmented vision as if she is looking through a prism and then she feels like she is moving slowly.  She had a CTA of the  head and neck because of these.  It was essentially unremarkable.  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  There was no evidence of recurrent aneurysm.  Her physician friend called me on 11/07/2014 to tell me that he had witnessed these episodes.  The patient complained about bright flashing lights before the episode and then she seemed a little rigid and sunk to the ground.  He was not sure if there was loss of consciousness as she just sunk to the ground, but he did note that when he spoke to her on the ground, she was completely awake and aware.  She had an EEG that did not demonstrate any seizures and then had an ambulatory EEG that just demonstrated temporal delta, without epileptiform activity.  She does have a cardiac consult pending on 01/04/2015.   Her daughter states that a "spell" seems to start with a swaying and then the body seems to be unable to support her.  She seems slow to communicate but is able.  It seems unrelated to dosing timing.  After an episode she seems to need to be in bed all day.  There may be some lightheadedness.  If she sees a prism of light, it lasts a few min and doesn't occur before but during an episode.  Today, they say there is never LOC.  Her face seems white during an episode.  An episode never happens while seated.  An episode is happening in clusters.  A week ago, she had 3 in a day and then this past week she had 1 episode.    Besides for the episodes, her daughter states that they have seen a steep physical decline.  They have seen more  shuffling.  The right leg feels like a "peg leg."   Daughter relates that in June she fell hard on concrete and hit her head and had a very large knot on her posterior occiput and refused to go and get it evaluated.   She is still exercising but hasn't been the last 2 weeks because she hasn't been feeling as well.  She is drinking a glass of wine a day.  She is still on klonopin, 1.5 tablets at night (increased in Jan by Dr. Timothy Lasso).  02/28/15 update:  The patient has a history of multiple system atrophy.  She is accompanied by her husband who supplements the history.    She is on carbidopa/levodopa 25/100, 2 tablets in the morning, one the afternoon and one in the evening as well as pramipexole 0.5 mg 3 times per day.  She had a CT of the brain last visit that was negative.  Last visit, I felt that she was likely having "spells" because of orthostatic hypotension although she was not orthostatic in my office.  I did give her a prescription for an abdominal binder as well as compression stockings.  She is wearing the binder today but couldn't get on the stockings.  She ultimately saw Dr. Rennis Golden and I reviewed his records and am appreciative of his input.  The patient was orthostatic in his office so he felt that a tilt table was not indicated.  He did start her on Northera.  She is now on 600 mg tid.  She ended up in the emergency room on 01/31/2015 at [facility] after a fall.  She did not have a "spell" but she just missed a step.  She missed a step and fell backwards into her  head.  She had another CT of the brain done and there was no evidence of intracerebral bleeding or hemorrhage although there was a moderate sized posterior scalp contusion.  A CT of the spine was done as well and was negative.  They placed staples in her head because of the scalp contusion.  Her blood pressure was apparently significantly elevated initially but it was much lower upon discharge.  She has been taking her BP regularly at home and  it has been low sometimes but not too high.  She has had a few other minor falls.  She is still going to the gym with a trainer 2 times a week and is going two other times a week.  No choking on foods.  Admits to intermittent diplopia with reading.    03/30/15 update:  The patient is following up today, accompanied by her husband who supplements the history.  I have spoken to her primary care physician over the telephone about her as well.  She is on carbidopa/levodopa 25/100, 2 tablets in the morning, one in the afternoon and one in the evening.  She is on pramipexole 0.5 mg, one tablet tid.  I asked her to hold her carbidopa/levodopa 50/200 at bedtime last visit but she didn't do that and is still on that.  Unfortunately, since our last visit she has had multiple falls (states more than she can count) and then a syncopal episode on October 13.  She had a CT of the brain after fall in October 6 that was nonacute.  She had a normal follow-up with her primary care physician on October 13 and her blood pressure was very low with systolic in the 60s.  She was told to go home and hydrate, but unfortunately she ended up going home and having a syncopal episode.  In the emergency room, she was given fluids and her blood pressures were actually quite high, as high as 181/80.  No low blood pressures were recorded in the emergency room as far as I can tell.  She was told by her primary care physician, the emergency room and myself to discontinue alcohol.  States that she hasn't had a drink since that ER visit.  She remains on Northera, 600 mg 3 times per day.  States that she was started on florinef earlier this week and husband feels that she is much better.  States that she was having fainting spells 2 times a day and none since starting the meds (not all sound like syncope and some perhaps near syncope).  Her PCP reports that pt decided on DNR and LOC orders filled out with him.  Husband just bought her a walker at  [employer] and he asks me about WC.  [facility]  06/21/15 update:  Patient follows up today, accompanied by her husband who supplements history.  I have reviewed prior records made available to me.  The patient is on carbidopa/levodopa 25/100, 2 tablets in the morning, one in the afternoon, one in the evening and carbidopa/levodopa 50/200 at night.  I asked her decrease her pramipexole last visit from 0.5 mg 3 times a day to 0.25 mg 3 times a day because of orthostasis and syncope.  She is not sure if she did that or not.   She continues on northera, 600 mg 3 times per day for this along with florinef 0.1 mg, 1-1/2 tablets daily.  She has done much better with this, and has not had  further syncope.  She did go to the emergency room on 05/06/2015 after a mechanical fall on her stairs.  A ct of the brain was negative.  She has not fallen since then.  She has had some near falls.  She has a PT re-eval upcoming in Feb.   She did ask me last visit for a [facility] and has an appointment on 07/09/2015 [facility].  Her husband states that a friend of theirs [facility] PD [facility] and has had DBS and thinks that this is the best place to go.  Asks me why I think that she has MSA and not PD.  10/24/15 update:  Patient follows up today, accompanied by her husband who supplements history.  I have reviewed prior records made available to me.  The patient is on carbidopa/levodopa 25/100, 2 tablets in the morning, one in the afternoon, one in the evening and carbidopa/levodopa 50/200 at night.  She is on pramipexole 0.25 mg 3 times a day, which is a lower dosage because of orthostasis and syncope.  She continues on northera, 600 mg 3 times per day for this along with florinef 0.1 mg, 1-1/2 tablets daily.  She has done much better with this, in addition to medications.  However, her daughter called yesterday and stated that the patient had several episodes  this past weekend where her BP would drop and she would "go limp" (no LOC per pt) and one time her L arm shook and one time the R arm shook.  These have been worked up extensively in the past and determined all likely due to BP drops and significant orthostasis. Pt does describe that on Friday, she was sitting at dinner and looked up and she saw lights.  She then had head heaviness and she felt that her body would "curl up" and her family thought that it was her BP lowering so they had her "march in place" in her seat.  She then went back to the hotel without further incident.  The following day, she sat down and then stood up and fell.  She couldn't get up and states that she think that she passed out.  States that sometimes when she has a "sinking spell" her arms and legs will shake, but she is awake when that happens.  It is both arms and legs according to pt/husband.  Her husband states that "if I get her feet up in the recliner, she never has the spells."   Her husband is going to [place] for 3 months but they have hired a caregiver during the day and her son is coming at night.   She [facility].  I reviewed his records but he really said nothing new other than to refer her to [facility].  She is going to see Dr. Fidela Juneauooney on 07/03/15.  She asked me again today about alcohol.  She is drinking daily, one glass of wine.  Her husband states that it depends on who is pouring it that will determine the size of that class.  The patient insists that is very small.  02/26/16 update:  Pt f/u today, accompanied by her husband who supplements the history.  The records that were made available to me were reviewed. Saw Dr. Fidela Juneauooney on 11/01/15 for another opinion.  He had her increase her carbidopa/levodopa 25/100, to 2 po tid and continue carbidopa/levodopa 50/200 q  hs.  Had her decrease her d/c her pramipexole.  States that she was all excited about d/c it, but had lots of  RLS and more of the hypotensive spells, even off of the medication.  She went back on pramipexole, 0.25 mg, 1/2 tablet at night but husband states that he thought it was discontinued when she went to the subacute nursing facility.  However, she is back on it at 0.5 mg 3 times per day.  On both northera and florinef for OH.   Went to the ED/admitted on 02/19/16.  Had MS change with mild aphasia and right hemianopia.  While in the hospital, pt had hypotensive episode 30 min after husband gave her florinef and carbidopa/levodopa with associated MS change.  Was given IV fluids and her BP and MS improved.  Had the following tests in hospital:    CT head without contrast, personally reviewed by me 02/21/2016 1. No acute hemorrhage or evidence of acute cortical infarct. 2. Unchanged right frontal encephalomalacia and postsurgical changes of prior aneurysm clipping.  2D Echocardiogram  pending - Left ventricle: The cavity size was normal. There was mild focal basal hypertrophy of the septum. Systolic function was normal. The estimated ejection fraction was in the range of 55% to 60%. Wall motion was normal; there were no regional wall motion abnormalities. Left ventricular diastolic function parameters were normal.  EEG  02/21/2016 This awake and asleepEEG is abnormal due to the presence of: 1. Moderatediffuse slowing of the waking background 2. Additional focal slowing over the right hemisphere, maximal over the right temporal region  She had an LDL 127 and was started on lipitor and ASA was changed to plavix 75 mg daily.  She was d/c to [facility] on 02/22/16.  Pts husband states that last night the SNF called and wanted to give her something to decrease the blood pressure.  Reviewing her medications from [facility], multiple medication changes have been made.  She is now on carpet the/levodopa 25/100, 2 tablets in the morning, one in the afternoon and one in the evening (instead of 23  times per day) and remains on carpet the/levodopa 50/200 at night.  The immediate release levodopa was changed to oral bisacodyl tablets.  Her husband states at home she was taking Florinef 0.1 mg, 2 tablets in the morning and an additional tablet 5 hours later.  This is not being given at the subacute nursing facility.  The patient reports that she generally wears her abdominal compression binder, but did not today.  Husband is concerned that she could get better care/therapy at home and is not sure how long we will keep her in [facility].  PREVIOUS MEDICATIONS: none to date  ALLERGIES:   Allergies  Allergen Reactions  . Penicillins Rash    *childhood allergy* Has patient had a PCN reaction causing immediate rash, facial/tongue/throat swelling, SOB or lightheadedness with hypotension: Yes Has patient had a PCN reaction causing severe rash involving mucus membranes or skin necrosis: No Has patient had a PCN reaction that required hospitalization No Has patient had a PCN reaction occurring within the last 10 years: No If all of the above answers are "NO", then may proceed with Cephalosporin use.     CURRENT MEDICATIONS:  Current Outpatient Prescriptions on File Prior to Visit  Medication Sig Dispense Refill  . atorvastatin (LIPITOR) 20 MG tablet Take 1 tablet (20 mg total) by mouth daily at 6 PM. 30 tablet 0  . bimatoprost (LUMIGAN) 0.01 % SOLN  Place 1 drop into both eyes at bedtime.    . carbidopa-levodopa (SINEMET CR) 50-200 MG tablet TAKE 1 TABLET BY MOUTH AT BEDTIME 30 tablet 5  . carbidopa-levodopa (SINEMET IR) 25-100 MG per tablet TAKE 2 TABLETS IN THE AM, 1 TABLET AT NOON, 1 IN THE EVENING (Patient taking differently: Take 2 tablets by mouth in the morning then 2 tablets at noon then 2 tablets with dinner (evening meal)) 120 tablet 5  . clopidogrel (PLAVIX) 75 MG tablet Take 1 tablet (75 mg total) by mouth daily. 30 tablet 0  . diphenhydrAMINE (BENADRYL) 50 MG capsule Take 50 mg by  mouth at bedtime.    . fludrocortisone (FLORINEF) 0.1 MG tablet Take 2 tablets (0.2 mg total) by mouth daily. (Patient taking differently: Take 0.1-0.2 mg by mouth See admin instructions. 0.2 mg in the morning and 0.1 mg five hours later) 75 tablet 3  . fluticasone (FLONASE) 50 MCG/ACT nasal spray Place 2 sprays into both nostrils at bedtime as needed.  4  . LYSINE PO Take 1 tablet by mouth daily.     . Melatonin 5 MG TABS Take 5 mg by mouth at bedtime.    Marland Kitchen MYRBETRIQ 25 MG TB24 tablet Take 25 mg by mouth at bedtime.     . Potassium Chloride ER 20 MEQ TBCR Take 20 mEq by mouth daily.   0  . pramipexole (MIRAPEX) 0.25 MG tablet Take 1 tablet (0.25 mg total) by mouth 3 (three) times daily. (Patient taking differently: Take 0.125 mg by mouth 3 (three) times daily. ) 90 tablet 5  . NORTHERA 300 MG CAPS TAKE 2 CAPSULES BY MOUTH THREE TIMES DAILY 180 capsule 10   No current facility-administered medications on file prior to visit.     PAST MEDICAL HISTORY:   Past Medical History:  Diagnosis Date  . Aneurysm (HCC)    s/p clips  . Glaucoma   . Hypotension   . Parkinson's disease (HCC)   . Sleep apnea     PAST SURGICAL HISTORY:   Past Surgical History:  Procedure Laterality Date  . aneurym clipping    . CRANIOTOMY      SOCIAL HISTORY:   Social History   Social History  . Marital status: Married    Spouse name: N/A  . Number of children: N/A  . Years of education: N/A   Occupational History  . retired     Copy, Music therapist   Social History Main Topics  . Smoking status: Former Games developer  . Smokeless tobacco: Not on file     Comment: quit 40 years ago  . Alcohol use 4.2 oz/week    7 Glasses of wine per week     Comment: 1-2 glasses wine/day (6-8 oz in each glass)  . Drug use: No  . Sexual activity: Yes   Other Topics Concern  . Not on file   Social History Narrative  . No narrative on file    FAMILY HISTORY:   Family Status  Relation Status  . Mother Deceased at  age 54   fall  . Father Deceased at age 95   CAD, renal failure  . Brother Deceased   CVA, MI after hip replacement  . Brother Alive   bladder CA  . Child Alive   healthy  . Brother   . Brother   . Brother   . Brother     ROS:  A complete 10 system review of systems was obtained and was unremarkable  apart from what is mentioned above.  PHYSICAL EXAMINATION:    VITALS:   Vitals:   02/26/16 1053 02/26/16 1134  BP: (!) 70/42 (!) 85/60  BP Location: Right Arm Left Arm  Patient Position: Sitting   Cuff Size: Normal Normal  Pulse: 70    No data found.     GEN:  The patient appears stated age and is in NAD.   HEENT:  Normocephalic, atraumatic.  The mucous membranes are moist. The superficial temporal arteries are without ropiness or tenderness. CV:  RRR Lungs:  CTAB.  She does have some inspiratory gasps. Neck/HEME:  There are no carotid bruits bilaterally.  Neurological examination:  Orientation: The patient is alert and oriented x3. Fund of knowledge is appropriate.  Recent and remote memory are intact.  Attention and concentration are normal.    Able to name objects and repeat phrases. Cranial nerves: There is good facial symmetry.   Extraocular muscles are intact.  There are no square wave jerks.  The visual fields are full to confrontational testing. The speech is fluent and clear. Soft palate rises symmetrically and there is no tongue deviation. Hearing is intact to conversational tone. Sensation: Sensation is intact to light and pinprick throughout (facial, trunk, extremities). Vibration is intact at the bilateral big toe. There is no extinction with double simultaneous stimulation. There is no sensory dermatomal level identified.   Motor: Strength is 5/5 in the bilateral upper and lower extremities.   Shoulder shrug is equal and symmetric.  There is no pronator drift.   Movement examination: Tone: There is mild increased tone in the RUE Abnormal movements: There is  no tremor, even with distraction techniques. Coordination:  There is decremation with RAM's on the right Gait and Station: walks slowly down the hall with good stride length and decreased arm swing and slightly drags the right leg   ASSESSMENT/PLAN:  1.  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   -pt and husband had multiple questions today re: MSA.    - We wrote orders to her home dosing and changed her from the oral including tablets to her home immediate release carbidopa/levodopa 25/100 2 tablets 3 times per day  -Decrease Mirapex to her home dosing so she is just taking 0.25 mg at night.  -Continue carbidopa/levodopa 50/200 which seems to be helping nighttime cramping  -Needs to wear abdominal compression binder at all times and I wrote an order for [facility] in this regard.  I also asked them not to treat a systolic blood pressure over 200 unless it was sustained.  I asked them to take off the abdominal compression binder if the blood pressure was over 200 and recheck it in an hour, so long as she was asymptomatic.  -If the patient leaves [facility], she needs 24/7 care with an increase in caregiver time.  She needs to move the bedroom downstairs.  She needs to stay seated at all times, unless someone is directly walking with her and holding her.  2.  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  -had significant hypotensive spell with MS change in hospital after husband administered her both florinef and carbidopa/levodopa 25/100, so I suspect, as I have all along, that "sinking spells" that she has had for a long time are also related to hypotension.  3.  Possible cerebral infarct  -  unable to do MRI so nonconfirmative.  I also think it is very probable that she just had significant hypotension that led to hypoperfusion, and  possibly an infarct as a result.  -On lipitor for LDL 127 and plavix 75 mg daily (was on ASA prior).  Understands risk of antiplatelets esp given fall risk.  However, she really should not be walking alone any longer.  No melena or hematochezia.   4.  Urinary frequency  -saw gyn.  On Myrbetriq, which she finds intermittently helpful.  5.  Sleep difficulty  -She is off of her clonazepam.  6. Follow up is anticipated in the next 3 months, sooner should new neurologic issues arise.   Much greater than 50% of this visit was spent in counseling with the patient and the family.  Total face to face time:  40 min

## 2016-02-25 ENCOUNTER — Telehealth: Payer: Self-pay | Admitting: Internal Medicine

## 2016-02-25 ENCOUNTER — Other Ambulatory Visit: Payer: Self-pay | Admitting: Internal Medicine

## 2016-02-25 NOTE — Telephone Encounter (Signed)
Attempt to return call-no answer, lmtcb. 

## 2016-02-25 NOTE — Telephone Encounter (Signed)
New message     FYI    Pts husband called and said that the pt is in the hospital b/c she had a series of strokes. She will be moved to a Rehab center.

## 2016-02-26 ENCOUNTER — Encounter: Payer: Self-pay | Admitting: Neurology

## 2016-02-26 ENCOUNTER — Ambulatory Visit (INDEPENDENT_AMBULATORY_CARE_PROVIDER_SITE_OTHER): Payer: Medicare Other | Admitting: Neurology

## 2016-02-26 ENCOUNTER — Ambulatory Visit: Payer: Medicare Other | Admitting: Internal Medicine

## 2016-02-26 VITALS — BP 85/60 | HR 70

## 2016-02-26 DIAGNOSIS — I951 Orthostatic hypotension: Secondary | ICD-10-CM | POA: Diagnosis not present

## 2016-02-26 DIAGNOSIS — G232 Striatonigral degeneration: Secondary | ICD-10-CM | POA: Diagnosis not present

## 2016-02-26 DIAGNOSIS — R55 Syncope and collapse: Secondary | ICD-10-CM

## 2016-02-27 ENCOUNTER — Non-Acute Institutional Stay (SKILLED_NURSING_FACILITY): Payer: Medicare Other | Admitting: Internal Medicine

## 2016-02-27 ENCOUNTER — Encounter: Payer: Self-pay | Admitting: Internal Medicine

## 2016-02-27 DIAGNOSIS — G459 Transient cerebral ischemic attack, unspecified: Secondary | ICD-10-CM | POA: Diagnosis not present

## 2016-02-27 DIAGNOSIS — N3281 Overactive bladder: Secondary | ICD-10-CM

## 2016-02-27 DIAGNOSIS — I951 Orthostatic hypotension: Secondary | ICD-10-CM

## 2016-02-27 DIAGNOSIS — G2581 Restless legs syndrome: Secondary | ICD-10-CM

## 2016-02-27 DIAGNOSIS — H409 Unspecified glaucoma: Secondary | ICD-10-CM

## 2016-02-27 DIAGNOSIS — G20 Parkinson's disease: Secondary | ICD-10-CM

## 2016-02-27 DIAGNOSIS — G4700 Insomnia, unspecified: Secondary | ICD-10-CM

## 2016-02-27 DIAGNOSIS — R5381 Other malaise: Secondary | ICD-10-CM

## 2016-02-27 DIAGNOSIS — R471 Dysarthria and anarthria: Secondary | ICD-10-CM

## 2016-02-27 DIAGNOSIS — E782 Mixed hyperlipidemia: Secondary | ICD-10-CM | POA: Diagnosis not present

## 2016-02-27 NOTE — Progress Notes (Signed)
LOCATION: [facility]  PCP: Gwen Pounds, MD   Code Status: Full Code  Goals of care: Advanced Directive information Advanced Directives 02/19/2016  Does patient have an advance directive? Yes  Would patient like information on creating an advanced directive? -       Extended Emergency Contact Information Primary Emergency Contact: Savell,John  [place] Mobile Phone: [phone] Relation: Son Secondary Emergency Contact: Dykstra,Jeffrey Address: [address]          [place], [place] [address] [place] Home Phone: [phone] Mobile Phone: [phone] Relation: Spouse   Allergies  Allergen Reactions  . Penicillins Rash    *childhood allergy* Has patient had a PCN reaction causing immediate rash, facial/tongue/throat swelling, SOB or lightheadedness with hypotension: Yes Has patient had a PCN reaction causing severe rash involving mucus membranes or skin necrosis: No Has patient had a PCN reaction that required hospitalization No Has patient had a PCN reaction occurring within the last 10 years: No If all of the above answers are "NO", then may proceed with Cephalosporin use.     Chief Complaint  Patient presents with  . New Admit To SNF    New Admission Visit     HPI:  Patient is a 70 y.o. female seen today for short term rehabilitation post hospital admission from 02/19/16-02/22/16 with aphasia for stroke workup and hypotension. She was seen by neurology and placed on plavix and lipitor. She was treated for antibiotic for UTI. She has medical history of akinetic rigid parkinson's disease, autonomic postural hypotension, intracranial aneurysm s/p clipping, glaucoma among others. She is seen in her room today.   Review of Systems:  Constitutional: Negative for fever, chills, diaphoresis. Feels weak and tired. HENT: Negative for headache, congestion, nasal discharge, difficulty swallowing.   Eyes: Negative for blurred vision,  double vision and discharge. Wears glasses. Respiratory: Negative for cough, shortness of breath and wheezing.   Cardiovascular: Negative for chest pain, palpitations, leg swelling.  Gastrointestinal: Negative for heartburn, nausea, vomiting, abdominal pain. Positive for poor appetite. Last bowel movement was 4-5 days ago. Genitourinary: Negative for dysuria and flank pain.  Musculoskeletal: Negative for back pain, fall in the facility.  Skin: Negative for itching, rash.  Neurological: Positive for dizziness with change of position. Psychiatric/Behavioral: Negative for depression   Past Medical History:  Diagnosis Date  . Aneurysm (HCC)    s/p clips  . Glaucoma   . Hypotension   . Parkinson's disease (HCC)   . Sleep apnea    Past Surgical History:  Procedure Laterality Date  . aneurym clipping    . CRANIOTOMY     Social History:   reports that she has quit smoking. She does not have any smokeless tobacco history on file. She reports that she drinks about 4.2 oz of alcohol per week . She reports that she does not use drugs.  Family History  Problem Relation Age of Onset  . Dementia Mother   . Heart attack Father   . Kidney failure Father   . Stroke Brother   . Cancer Brother     Prostate  . Diabetes Brother   . Aneurysm Brother     Medications:   Medication List       Accurate as of 02/27/16 11:59 PM. Always use your most recent med list.          atorvastatin 20 MG tablet Commonly known as:  LIPITOR Take 1 tablet (20 mg total) by mouth daily  at 6 PM.   bimatoprost 0.01 % Soln Commonly known as:  LUMIGAN Place 1 drop into both eyes at bedtime.   carbidopa-levodopa 50-200 MG tablet Commonly known as:  SINEMET CR Take 1 tablet by mouth every morning. Take at 6 am   carbidopa-levodopa 50-200 MG tablet Commonly known as:  SINEMET CR Take 2 tablets by mouth daily. Take 2 tablets at 8 am, noon, and 4 pm   carbidopa-levodopa 50-200 MG tablet Commonly known  as:  SINEMET CR TAKE 1 TABLET BY MOUTH AT BEDTIME   clopidogrel 75 MG tablet Commonly known as:  PLAVIX Take 1 tablet (75 mg total) by mouth daily.   diphenhydrAMINE 50 MG capsule Commonly known as:  BENADRYL Take 50 mg by mouth at bedtime.   FLUDROCORTISONE ACETATE PO Take 0.1 mg by mouth daily. Take 2 tablets   FLUDROCORTISONE ACETATE PO Take 0.1 mg by mouth daily. Take at 1 pm   fluticasone 50 MCG/ACT nasal spray Commonly known as:  FLONASE Place 2 sprays into both nostrils at bedtime as needed.   Lysine 500 MG Caps Take 1 capsule by mouth daily.   Melatonin 5 MG Tabs Take 5 mg by mouth at bedtime.   MYRBETRIQ 25 MG Tb24 tablet Generic drug:  mirabegron ER Take 25 mg by mouth at bedtime.   NORTHERA 300 MG Caps Generic drug:  Droxidopa TAKE 2 CAPSULES BY MOUTH THREE TIMES DAILY   Potassium Chloride ER 20 MEQ Tbcr Take 20 mEq by mouth daily.   pramipexole 0.25 MG tablet Commonly known as:  MIRAPEX Take 0.25 mg by mouth at bedtime.       Immunizations: Immunization History  Administered Date(s) Administered  . PPD Test 02/22/2016     Physical Exam:  Vitals:   02/27/16 1101  BP: 101/60  Pulse: 75  Resp: 19  Temp: 97.7 F (36.5 C)  TempSrc: Oral  Weight: 143 lb (64.9 kg)  Height: 5\' 8"  (1.727 m)   Body mass index is 21.74 kg/m.  General- elderly female, frail, thin built, in no acute distress Head- normocephalic, atraumatic Nose- no nasal discharge Throat- moist mucus membrane Eyes- PERRLA, EOMI, no pallor, no icterus, no discharge, normal conjunctiva, normal sclera Neck- no cervical lymphadenopathy Cardiovascular- normal s1,s2, no murmur, no leg edema Respiratory- bilateral clear to auscultation, no wheeze, no rhonchi, no crackles, no use of accessory muscles Abdomen- bowel sounds present, soft, non tender, abdominal binder present Musculoskeletal- able to move all 4 extremities, generalized weakness Neurological- alert and oriented to  person and place only, mild tremor present, word finding difficulty noted Skin- warm and dry Psychiatry- normal mood and affect    Labs reviewed: Basic Metabolic Panel:  Recent Labs  16/03/9608/12/17 1337 02/19/16 1340 02/21/16 0652 02/22/16 0921  NA 138 138 137 136  K 4.5 4.4 4.0 3.8  CL 101 97* 103 102  CO2 27  --  26 26  GLUCOSE 156* 149* 101* 191*  BUN 20 22* 20 18  CREATININE 0.94 0.80 0.81 0.82  CALCIUM 9.7  --  9.0 8.9   Liver Function Tests:  Recent Labs  02/19/16 1337  AST 21  ALT 5*  ALKPHOS 82  BILITOT 1.7*  PROT 6.6  ALBUMIN 4.3   No results for input(s): LIPASE, AMYLASE in the last 8760 hours. No results for input(s): AMMONIA in the last 8760 hours. CBC:  Recent Labs  02/19/16 1337 02/19/16 1340 02/21/16 0652 02/22/16 0921  WBC 9.0  --  8.6 7.1  NEUTROABS 7.3  --  6.7 5.3  HGB 14.0 14.6 12.4 11.9*  HCT 41.7 43.0 37.9 36.9  MCV 90.5  --  92.9 92.5  PLT 231  --  205 187   Cardiac Enzymes:  Recent Labs  03/22/15 1545 02/19/16 2009  TROPONINI <0.03 <0.03   BNP: Invalid input(s): POCBNP CBG:  Recent Labs  11/16/15 1015  GLUCAP 95    Radiological Exams: Ct Angio Head W Or Wo Contrast  Result Date: 02/19/2016 CLINICAL DATA:  Only done with were interested gland with a with a of gap about the there are out flattened of the sella at the end there 5 months images a we keep at the 7 -with foot could mass EXAM: CT ANGIOGRAPHY HEAD AND NECK TECHNIQUE: Multidetector CT imaging of the head and neck was performed using the standard protocol during bolus administration of intravenous contrast. Multiplanar CT image reconstructions and MIPs were obtained to evaluate the vascular anatomy. Carotid stenosis measurements (when applicable) are obtained utilizing NASCET criteria, using the distal internal carotid diameter as the denominator. CONTRAST:  50 cc of Isovue COMPARISON:  Prior CT from earlier the same day as well was prior CT 11/13/2014. FINDINGS: CTA  NECK Aortic arch: Visualized aortic arch of normal caliber with normal 3 vessel morphology. Mild atheromatous plaque within the aortic arch in at the origin of the great vessels without flow limiting stenosis. Visualized subclavian arteries are widely patent. Right carotid system: Right common carotid artery patent from its origin to the bifurcation. Mild atheromatous plaque about the right bifurcation with associated narrowing of up to 20% by NASCET criteria. This has mildly progressed from previous. Right ICA patent distally to the skullbase without stenosis, dissection, or occlusion. Right external carotid artery and its branches within normal limits. Left carotid system: Left common carotid artery patent from its origin to the bifurcation. Minimal a centric plaque about the left bifurcation without stenosis. Left ICA patent distally without stenosis, dissection, or occlusion. Left external carotid artery and its branches within normal limits. Vertebral arteries:Both vertebral arteries arise from the subclavian arteries. Vertebral arteries are largely code dominant. Atheromatous plaque at the origin of the right vertebral artery with approximately 50% narrowing. Vertebral arteries otherwise widely patent within the neck without stenosis, dissection, or occlusion. Skeleton: No acute osseous abnormality. No worrisome lytic or blastic osseous lesions. Other neck: Mild atelectatic changes within the visualized lungs. Visualized lungs are otherwise clear. Mediastinum within normal limits. Thyroid normal. No adenopathy within the neck. No acute soft tissue abnormality. CTA HEAD Anterior circulation: Petrous segments patent bilaterally without stenosis. Multifocal atheromatous plaque within the cavernous ICAs bilaterally, slightly worse on the right. There is associated moderate multi focal narrowing of up to 50%, most prevalent at the supraclinoid right ICA. A1 segments are patent. Surgical clip present at the right  aspect of the anterior communicating artery complex. No residual aneurysm. Anterior cerebral arteries patent distally but demonstrate multifocal atheromatous irregularity, similar to previous. There is a superimposed relatively severe stenosis at the proximal left A2 segment (series 10, image 23). Moderate narrowing of approximately 50% within the proximal right M1 segment. Right M1 segment patent distally. Right MCA bifurcation normal. No proximal M2 occlusion. Right MCA branches opacified to their distal aspects. Left M1 segment mildly irregular without significant stenosis. Left MCA bifurcation normal. Distal left MCA branches well opacified to their distal aspects. Posterior circulation: Vertebral arteries patent to the vertebrobasilar junction. Mild atheromatous irregularity in the distal right V4 segment without significant stenosis. Basilar artery widely patent. Superior cerebral  arteries patent bilaterally. Both of the posterior cerebral arteries arise from the basilar artery. Left PCA well opacified and patent to its distal aspect. There is a severe short-segment stenosis right P2 segment (series 8, image 116). This is similar to previous. Venous sinuses: Contents. Anatomic variants: No significant anatomic variant. No new aneurysm identified. Delayed phase: No pathologic enhancement. IMPRESSION: CTA NECK IMPRESSION: 1. Mild atherosclerotic changes within the aortic arch and carotid bifurcations bilaterally without high-grade or significant stenosis. Irregularity greatest at the right carotid bifurcation were there is associated 20% narrowing by NASCET criteria, mildly progressed relative to 2016. 2. Atheromatous plaque at the origin of the right vertebral artery with approximately 50% stenosis. Vertebral arteries otherwise widely patent within the neck. CTA HEAD IMPRESSION: 1. Atheromatous plaque throughout the anterior circulation, overall relatively similar to previous exam. Changes greatest within the  carotid siphons, proximal right right M1 segment, and anterior cerebral arteries. Narrowing at the supraclinoid right ICA estimated at 50% with additional 50% stenosis proximal right M1 segment. Short-segment severe proximal left A2 stenosis. 2. Short segment severe right P2 stenosis, similar. No other high-grade stenosis within the posterior circulation. 3. Sequelae of prior aneurysm clipping and/or coiling at the right anterior communicating artery complex. No evidence for recurrent aneurysm. Electronically Signed   By: Rise Mu M.D.   On: 02/19/2016 21:56   Ct Head Wo Contrast  Result Date: 02/21/2016 CLINICAL DATA:  History of Parkinson's disease. Follow-up stroke. Status post aneurysm clipping. EXAM: CT HEAD WITHOUT CONTRAST TECHNIQUE: Contiguous axial images were obtained from the base of the skull through the vertex without intravenous contrast. COMPARISON:  Head CT 02/19/2016 FINDINGS: Brain: Area of hypoattenuation in the right frontal periventricular white matter is unchanged. There is no acute hemorrhage. No midline shift or mass effect. No evidence of acute cortical infarct. Vascular: Aneurysm clip present over glue across Lewis is again noted. Skull: Status post remote right pterional craniectomy. Sinuses/Orbits: No fluid levels or advanced mucosal thickening. Normal orbits. Other: None IMPRESSION: 1. No acute hemorrhage or evidence of acute cortical infarct. 2. Unchanged right frontal encephalomalacia and postsurgical changes of prior aneurysm clipping. Electronically Signed   By: Deatra Robinson M.D.   On: 02/21/2016 06:56   Ct Angio Neck W Or Wo Contrast  Result Date: 02/19/2016 CLINICAL DATA:  Only done with were interested gland with a with a of gap about the there are out flattened of the sella at the end there 5 months images a we keep at the 7 -with foot could mass EXAM: CT ANGIOGRAPHY HEAD AND NECK TECHNIQUE: Multidetector CT imaging of the head and neck was performed using  the standard protocol during bolus administration of intravenous contrast. Multiplanar CT image reconstructions and MIPs were obtained to evaluate the vascular anatomy. Carotid stenosis measurements (when applicable) are obtained utilizing NASCET criteria, using the distal internal carotid diameter as the denominator. CONTRAST:  50 cc of Isovue COMPARISON:  Prior CT from earlier the same day as well was prior CT 11/13/2014. FINDINGS: CTA NECK Aortic arch: Visualized aortic arch of normal caliber with normal 3 vessel morphology. Mild atheromatous plaque within the aortic arch in at the origin of the great vessels without flow limiting stenosis. Visualized subclavian arteries are widely patent. Right carotid system: Right common carotid artery patent from its origin to the bifurcation. Mild atheromatous plaque about the right bifurcation with associated narrowing of up to 20% by NASCET criteria. This has mildly progressed from previous. Right ICA patent distally to the skullbase without  stenosis, dissection, or occlusion. Right external carotid artery and its branches within normal limits. Left carotid system: Left common carotid artery patent from its origin to the bifurcation. Minimal a centric plaque about the left bifurcation without stenosis. Left ICA patent distally without stenosis, dissection, or occlusion. Left external carotid artery and its branches within normal limits. Vertebral arteries:Both vertebral arteries arise from the subclavian arteries. Vertebral arteries are largely code dominant. Atheromatous plaque at the origin of the right vertebral artery with approximately 50% narrowing. Vertebral arteries otherwise widely patent within the neck without stenosis, dissection, or occlusion. Skeleton: No acute osseous abnormality. No worrisome lytic or blastic osseous lesions. Other neck: Mild atelectatic changes within the visualized lungs. Visualized lungs are otherwise clear. Mediastinum within normal  limits. Thyroid normal. No adenopathy within the neck. No acute soft tissue abnormality. CTA HEAD Anterior circulation: Petrous segments patent bilaterally without stenosis. Multifocal atheromatous plaque within the cavernous ICAs bilaterally, slightly worse on the right. There is associated moderate multi focal narrowing of up to 50%, most prevalent at the supraclinoid right ICA. A1 segments are patent. Surgical clip present at the right aspect of the anterior communicating artery complex. No residual aneurysm. Anterior cerebral arteries patent distally but demonstrate multifocal atheromatous irregularity, similar to previous. There is a superimposed relatively severe stenosis at the proximal left A2 segment (series 10, image 23). Moderate narrowing of approximately 50% within the proximal right M1 segment. Right M1 segment patent distally. Right MCA bifurcation normal. No proximal M2 occlusion. Right MCA branches opacified to their distal aspects. Left M1 segment mildly irregular without significant stenosis. Left MCA bifurcation normal. Distal left MCA branches well opacified to their distal aspects. Posterior circulation: Vertebral arteries patent to the vertebrobasilar junction. Mild atheromatous irregularity in the distal right V4 segment without significant stenosis. Basilar artery widely patent. Superior cerebral arteries patent bilaterally. Both of the posterior cerebral arteries arise from the basilar artery. Left PCA well opacified and patent to its distal aspect. There is a severe short-segment stenosis right P2 segment (series 8, image 116). This is similar to previous. Venous sinuses: Contents. Anatomic variants: No significant anatomic variant. No new aneurysm identified. Delayed phase: No pathologic enhancement. IMPRESSION: CTA NECK IMPRESSION: 1. Mild atherosclerotic changes within the aortic arch and carotid bifurcations bilaterally without high-grade or significant stenosis. Irregularity greatest  at the right carotid bifurcation were there is associated 20% narrowing by NASCET criteria, mildly progressed relative to 2016. 2. Atheromatous plaque at the origin of the right vertebral artery with approximately 50% stenosis. Vertebral arteries otherwise widely patent within the neck. CTA HEAD IMPRESSION: 1. Atheromatous plaque throughout the anterior circulation, overall relatively similar to previous exam. Changes greatest within the carotid siphons, proximal right right M1 segment, and anterior cerebral arteries. Narrowing at the supraclinoid right ICA estimated at 50% with additional 50% stenosis proximal right M1 segment. Short-segment severe proximal left A2 stenosis. 2. Short segment severe right P2 stenosis, similar. No other high-grade stenosis within the posterior circulation. 3. Sequelae of prior aneurysm clipping and/or coiling at the right anterior communicating artery complex. No evidence for recurrent aneurysm. Electronically Signed   By: Rise Mu M.D.   On: 02/19/2016 21:56   Ct Head Code Stroke W/o Cm  Result Date: 02/19/2016 CLINICAL DATA:  Code stroke.  Sudden onset aphasia. EXAM: CT HEAD WITHOUT CONTRAST TECHNIQUE: Contiguous axial images were obtained from the base of the skull through the vertex without intravenous contrast. 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 Encephalomalacia in the anterior right temporal and inferior right frontal lobes and anterior right basal ganglia is unchanged. Ex vacuo enlargement of the right frontal horn is unchanged. There is a chronic lacunar infarct in the left caudate head. There is no evidence of acute cortical infarct, intracranial hemorrhage, mass, midline shift, or extra-axial fluid collection. Orbits are unremarkable. Paranasal sinuses and mastoid air cells are clear. Calcified atherosclerosis is noted at the skullbase.  ASPECTS [facility] Stroke Program Early CT Score) - Ganglionic level infarction (caudate, lentiform nuclei, internal capsule, insula, M1-M3 cortex): 7 - Supraganglionic infarction (M4-M6 cortex): 3 Total score (0-10 with 10 being normal): 10 IMPRESSION: 1. No evidence of acute intracranial abnormality. 2. ASPECTS is 10. 3. Unchanged encephalomalacia in the right cerebral hemisphere. Chronic left caudate lacunar infarct. These results were called by telephone at the time of interpretation on 02/19/2016 at 2:01 pm to Dr. Amada Jupiter, who verbally acknowledged these results. Electronically Signed   By: Sebastian Ache M.D.   On: 02/19/2016 14:02    Assessment/Plan  Physical deconditioning Will have her work with physical therapy and occupational therapy team to help with gait training and muscle strengthening exercises.fall precautions. Skin care. Encourage to be out of bed.   TIA Seen by neurology. Continue plavix and atorvastatin  Akinetic parkinson's disease Continue sinemet 50-200 mg 1 tab at 6 am, 2 tab at 8 am, noon and 4 pm and 1 tab at bedtime. Followed by neurology service. Will have patient work with PT/OT as tolerated to regain strength and restore function.  Fall precautions are in place. Continue droxidopa. Dosing of sinemet recently adjusted by neurology  Autonomic postural hypotension Continue fludrocortisone current regimen. To wear abdominal binder. Add ted hose. Fall precautions. Monitor orthostatic blood pressure.   Dysarthria SLP to evaluate and monitor.   HLD Continue statin  Insomnia Continue melatonin  Constipation Add prune juice and colace 100 mg bid, hydration encouraged, monitor  OAB Continue myrbetriq  Glaucoma Continue bimatoprost eye drop  RLS Continue pramipexole   Goals of care: short term rehabilitation   Labs/tests ordered: cbc, cmp  Family/ staff Communication: reviewed care plan with patient and nursing supervisor    Oneal Grout,  MD Internal Medicine [facility] [address] [place], [place] [address] Cell Phone (Monday-Friday 8 am - 5 pm): [phone] On Call: [phone] and follow prompts after 5 pm and on weekends Office Phone: [phone] Office Fax: [phone]

## 2016-02-27 NOTE — Telephone Encounter (Signed)
Husband has not returned call. Appears this was FYI for MD - will route

## 2016-03-10 ENCOUNTER — Telehealth: Payer: Self-pay | Admitting: Neurology

## 2016-03-10 NOTE — Telephone Encounter (Signed)
Left message on machine for patient to call back.  To discuss Carbidopa Levodopa prescription with patient. We received note from patient that she has been getting prescription filled from Dr. Arbutus Leasat and Johny ShearsKimberly Struble, PA at [facility]. Awaiting call back to discuss.

## 2016-03-11 ENCOUNTER — Non-Acute Institutional Stay (SKILLED_NURSING_FACILITY): Payer: Medicare Other | Admitting: Family

## 2016-03-11 DIAGNOSIS — I639 Cerebral infarction, unspecified: Secondary | ICD-10-CM

## 2016-03-11 DIAGNOSIS — R2681 Unsteadiness on feet: Secondary | ICD-10-CM

## 2016-03-11 DIAGNOSIS — E782 Mixed hyperlipidemia: Secondary | ICD-10-CM

## 2016-03-11 DIAGNOSIS — R531 Weakness: Secondary | ICD-10-CM | POA: Diagnosis not present

## 2016-03-11 DIAGNOSIS — I951 Orthostatic hypotension: Secondary | ICD-10-CM

## 2016-03-11 DIAGNOSIS — G20 Parkinson's disease: Secondary | ICD-10-CM | POA: Diagnosis not present

## 2016-03-11 NOTE — Telephone Encounter (Signed)
Mailed letter to patient for her to contact our office.

## 2016-03-11 NOTE — Progress Notes (Signed)
Location:  [facility] Room Number: 604 Place of Service:  SNF [phone])  Provider: Richarda Blade FNP-C   PCP: Gwen Pounds, MD Patient Care Team: Creola Corn, MD as PCP - General (Internal Medicine)  Extended Emergency Contact Information Primary Emergency Contact: Richrd Humbles [place] Phone: [phone] Relation: Son Secondary Emergency Contact: Everard,Jeffrey Address: [address]          [place], [place] [address] [place] of [place] Home Phone: [phone] Mobile Phone: [phone] Relation: Spouse  Code Status: Full Code  Goals of care:  Advanced Directive information Advanced Directives 02/19/2016  Does patient have an advance directive? Yes  Would patient like information on creating an advanced directive? -     Allergies  Allergen Reactions  . Penicillins Rash    *childhood allergy* Has patient had a PCN reaction causing immediate rash, facial/tongue/throat swelling, SOB or lightheadedness with hypotension: Yes Has patient had a PCN reaction causing severe rash involving mucus membranes or skin necrosis: No Has patient had a PCN reaction that required hospitalization No Has patient had a PCN reaction occurring within the last 10 years: No If all of the above answers are "NO", then may proceed with Cephalosporin use.     Chief Complaint  Patient presents with  . Discharge Note    HPI:  70 y.o. female seen today at [facility] for discharge. She was here for short term rehabilitation post hospital admission from 02/19/2016-02/22/2016 for aphasia. She was seen by Neurology and had a workup for stroke. Due to her prior aneurysm clipping no MRI done. She became hypotensive and unresponsive after husband administered florinef and carbidopa and was admitted to stepdown unit. Rapid response called and IVF bolus given with much improvement in B/p. CT scan done showed unchanged encephalomalacia  without hemorrhage or acute infact. It was thought her symptoms were due to TIA and was started on Plavix and Lipitor. 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 She is seen in her room today. She denies any acute issues this visit. B/p stable. She was seen by [facility] Neurology  02/26/2016 multiple adjustment for Carbidopa/levodopa, Pramipexole and Florinef. Neurology also ordered patient to wear abdominal compression Binder at all times. If SBP> 200 take off binder and recheck B/P in one hour and Do Not treat for high B/P unless sustained SBP > 200. She has has worked well with PT/OT now stable for discharge home.She will be discharged with Home health PT/OT to continue with ROM, Exercise, Gait stability and muscle strengthening. She does not require any DME has own FWW. Home health services will be arranged by facility social worker prior to discharge. She will D/C with Meds from facility. Prescription medication will be written x 1 month then patient to follow up with PCP in 1-2 weeks. Facility staff report no new concerns.      Past Medical History:  Diagnosis Date  . Aneurysm (HCC)    s/p clips  . Glaucoma   . Hypotension   . Parkinson's disease (HCC)   . Sleep apnea     Past Surgical History:  Procedure Laterality Date  . aneurym clipping    . CRANIOTOMY        reports that she has quit smoking. She does not have any smokeless tobacco history on file. She reports that she drinks about 4.2 oz of alcohol per  week . She reports that she does not use drugs. Social History   Social History  . Marital status: Married    Spouse name: N/A  . Number of children: N/A  . Years of education: N/A   Occupational History  . retired     Copycurator, [employer]   Social History Main Topics  . Smoking status: Former Games developermoker  . Smokeless tobacco: Not on file     Comment: quit 40  years ago  . Alcohol use 4.2 oz/week    7 Glasses of wine per week     Comment: 1-2 glasses wine/day (6-8 oz in each glass)  . Drug use: No  . Sexual activity: Yes   Other Topics Concern  . Not on file   Social History Narrative  . No narrative on file      Allergies  Allergen Reactions  . Penicillins Rash    *childhood allergy* Has patient had a PCN reaction causing immediate rash, facial/tongue/throat swelling, SOB or lightheadedness with hypotension: Yes Has patient had a PCN reaction causing severe rash involving mucus membranes or skin necrosis: No Has patient had a PCN reaction that required hospitalization No Has patient had a PCN reaction occurring within the last 10 years: No If all of the above answers are "NO", then may proceed with Cephalosporin use.     Pertinent  Health Maintenance Due  Topic Date Due  . MAMMOGRAM  01/08/1997  . COLONOSCOPY  01/08/1997  . DEXA SCAN  01/09/2012  . PNA vac Low Risk Adult (1 of 2 - PCV13) 01/09/2012  . INFLUENZA VACCINE  01/08/2016    Medications:   Medication List       Accurate as of 03/11/16  2:37 PM. Always use your most recent med list.          atorvastatin 20 MG tablet Commonly known as:  LIPITOR Take 1 tablet (20 mg total) by mouth daily at 6 PM.   bimatoprost 0.01 % Soln Commonly known as:  LUMIGAN Place 1 drop into both eyes at bedtime.   carbidopa-levodopa 50-200 MG tablet Commonly known as:  SINEMET CR Take 1 tablet by mouth every morning. Take at 6 am   Carbidopa-Levodopa ER 25-100 MG tablet controlled release Commonly known as:  SINEMET CR Take 2 tablets by mouth daily. Take 2 tablets at 8 am, noon, and 4 pm   carbidopa-levodopa 50-200 MG tablet Commonly known as:  SINEMET CR TAKE 1 TABLET BY MOUTH AT BEDTIME   clopidogrel 75 MG tablet Commonly known as:  PLAVIX Take 1 tablet (75 mg total) by mouth daily.   diphenhydrAMINE 50 MG capsule Commonly known as:  BENADRYL Take 50 mg by mouth at  bedtime.   docusate sodium 100 MG capsule Commonly known as:  COLACE Take 100 mg by mouth 2 (two) times daily.   FLUDROCORTISONE ACETATE PO Take 0.1 mg by mouth daily. Take 2 tablets   FLUDROCORTISONE ACETATE PO Take 0.1 mg by mouth daily. Take at 1 pm   fluticasone 50 MCG/ACT nasal spray Commonly known as:  FLONASE Place 2 sprays into both nostrils at bedtime as needed.   Lysine 500 MG Caps Take 1 capsule by mouth daily.   Melatonin 5 MG Tabs Take 5 mg by mouth at bedtime.   MYRBETRIQ 25 MG Tb24 tablet Generic drug:  mirabegron ER Take 25 mg by mouth at bedtime.   NORTHERA 300 MG Caps Generic drug:  Droxidopa TAKE 2 CAPSULES BY MOUTH THREE TIMES  DAILY   Potassium Chloride ER 20 MEQ Tbcr Take 20 mEq by mouth daily.   pramipexole 0.25 MG tablet Commonly known as:  MIRAPEX Take 0.25 mg by mouth at bedtime.       Review of Systems  Constitutional: Negative for activity change, appetite change, chills, fatigue and fever.  HENT: Negative for congestion, rhinorrhea, sinus pressure, sneezing and sore throat.   Eyes: Negative.   Respiratory: Negative for cough, chest tightness, shortness of breath and wheezing.   Cardiovascular: Negative for chest pain, palpitations and leg swelling.  Gastrointestinal: Negative for abdominal distention, abdominal pain, constipation, diarrhea, nausea and vomiting.  Endocrine: Negative.   Genitourinary: Negative for dysuria, frequency and urgency.  Musculoskeletal: Positive for gait problem.       Generalized muscle weakness   Skin: Negative for color change, pallor and rash.  Neurological: Negative for dizziness, seizures, syncope, light-headedness and headaches.  Psychiatric/Behavioral: Negative for agitation, confusion, hallucinations and sleep disturbance. The patient is not nervous/anxious.     Vitals:   03/11/16 1030  BP: (!) 147/83  Pulse: 75  Resp: 18  Temp: (!) 96.5 F (35.8 C)  SpO2: 93%  Weight: 147 lb 3.2 oz (66.8 kg)   Height: 5\' 8"  (1.727 m)   Body mass index is 22.38 kg/m. Physical Exam  Constitutional: She is oriented to person, place, and time.  Thin frail elderly   HENT:  Head: Normocephalic.  Mouth/Throat: Oropharynx is clear and moist. No oropharyngeal exudate.  Eyes: Conjunctivae and EOM are normal. Pupils are equal, round, and reactive to light. Right eye exhibits no discharge. Left eye exhibits no discharge. No scleral icterus.  Neck: Normal range of motion. No JVD present. No thyromegaly present.  Cardiovascular: Normal rate, regular rhythm, normal heart sounds and intact distal pulses.  Exam reveals no gallop and no friction rub.   No murmur heard. Pulmonary/Chest: Effort normal and breath sounds normal. No respiratory distress. She has no wheezes. She has no rales.  Abdominal: Soft. Bowel sounds are normal. She exhibits no distension. There is no tenderness. There is no rebound and no guarding.  Musculoskeletal: She exhibits no edema, tenderness or deformity.  Unsteady gait. Moves x 4 extremities.   Lymphadenopathy:    She has no cervical adenopathy.  Neurological: She is oriented to person, place, and time.  Skin: Skin is warm and dry. No rash noted. No erythema. No pallor.  Psychiatric: She has a normal mood and affect.    Labs reviewed: Basic Metabolic Panel:  Recent Labs  16/10/96 1337 02/19/16 1340 02/21/16 0652 02/22/16 0921  NA 138 138 137 136  K 4.5 4.4 4.0 3.8  CL 101 97* 103 102  CO2 27  --  26 26  GLUCOSE 156* 149* 101* 191*  BUN 20 22* 20 18  CREATININE 0.94 0.80 0.81 0.82  CALCIUM 9.7  --  9.0 8.9   Liver Function Tests:  Recent Labs  02/19/16 1337  AST 21  ALT 5*  ALKPHOS 82  BILITOT 1.7*  PROT 6.6  ALBUMIN 4.3    Recent Labs  02/19/16 1337 02/19/16 1340 02/21/16 0652 02/22/16 0921  WBC 9.0  --  8.6 7.1  NEUTROABS 7.3  --  6.7 5.3  HGB 14.0 14.6 12.4 11.9*  HCT 41.7 43.0 37.9 36.9  MCV 90.5  --  92.9 92.5  PLT 231  --  205 187    Cardiac Enzymes:  Recent Labs  03/22/15 1545 02/19/16 2009  TROPONINI <0.03 <0.03    Recent  Labs  11/16/15 1015  GLUCAP 95   Assessment/Plan:   1. Akinetic rigid Parkinsons disease seen by [facility] Neurology  02/26/2016 multiple adjustment for Carbidopa/levodopa, Pramipexole and Florinef. Continue current medication. Continue to follow up with Neurology.  2. Autonomic postural hypotension Status post  short term rehabilitation post hospital admission from 02/19/2016-02/22/2016 for hypotension and unresponsive.   seen by [facility] Neurology  02/26/2016 ordered patient to wear abdominal compression Binder at all times. If SBP> 200 takep off binder and recheck B/P in one hour and Do Not treat for high B/P unless sustained SBP > 200.  3. Cerebral infarction, unspecified mechanism (HCC) CT scan done during recent Hospital admission  showed unchanged encephalomalacia without hemorrhage or acute infact. It was thought her symptoms were due to TIA and was started on Plavix and Lipitor.  Continue to monitor and follow up with Neurology.   4. Mixed hyperlipidemia Continue statin.   5. Generalized weakness Has improved with PT/OT. Will discharge with HH: PT/OT to continue with ROM, Exercise, Gait stability and muscle strengthening.  6. Unsteady gait PT/OT to continue with ROM, Exercise, Gait stability and muscle strengthening. She does not require any DME has own FWW.Fall and safety precautions.     Patient is being discharged with the following home health services:    -PT/OT for ROM, exercise, Gait stability and muscle strengthening.   Patient is being discharged with the following durable medical equipment:    - No DME required has own walker.   Patient has been advised to f/u with their PCP in 1-2 weeks to for a transitions of care visit.  Social services at their facility was responsible for arranging this appointment.  Pt was provided with adequate prescriptions of noncontrolled  medications to reach the scheduled appointment .  For controlled substances, a limited supply was provided as appropriate for the individual patient.  If the pt normally receives these medications from a pain clinic or has a contract with another physician, these medications should be received from that clinic or physician only).    Future labs/tests needed: CBC, BMP in 1-2 weeks with PCP

## 2016-03-13 ENCOUNTER — Telehealth: Payer: Self-pay | Admitting: Neurology

## 2016-03-13 NOTE — Telephone Encounter (Signed)
Cassanda Sanderlin 01/24/1946. Her husband called.  Corrie DandyMary has [facility] in [facility] the [facility]. She has only been getting prescriptions through our office and that [facility] them to her. They were getting them from another place while she was in there facility. They were giving it to her. He had received a letter regarding all of this.  Her # is [phone].

## 2016-03-13 NOTE — Telephone Encounter (Signed)
Dr. Tat - FYI. 

## 2016-04-04 ENCOUNTER — Other Ambulatory Visit: Payer: Self-pay | Admitting: Neurology

## 2016-04-04 MED ORDER — CARBIDOPA-LEVODOPA ER 50-200 MG PO TBCR: 1.0000 | EXTENDED_RELEASE_TABLET | Freq: Every day | ORAL | 0 refills | Status: DC

## 2016-04-29 ENCOUNTER — Encounter: Payer: Self-pay | Admitting: Internal Medicine

## 2016-04-29 ENCOUNTER — Ambulatory Visit (INDEPENDENT_AMBULATORY_CARE_PROVIDER_SITE_OTHER): Payer: Medicare Other | Admitting: Internal Medicine

## 2016-04-29 VITALS — BP 136/86 | HR 82 | Ht 68.5 in | Wt 138.2 lb

## 2016-04-29 DIAGNOSIS — G232 Striatonigral degeneration: Secondary | ICD-10-CM | POA: Diagnosis not present

## 2016-04-29 DIAGNOSIS — R55 Syncope and collapse: Secondary | ICD-10-CM

## 2016-04-29 DIAGNOSIS — I951 Orthostatic hypotension: Secondary | ICD-10-CM

## 2016-04-29 NOTE — Patient Instructions (Signed)
Medication Instructions:  Your physician recommends that you continue on your current medications as directed. Please refer to the Current Medication list given to you today.  Labwork: None   Testing/Procedures: none  Follow-Up: Your physician wants you to follow-up in: 6 MONTHS WITH DR HILTY. You will receive a reminder letter in the mail two months in advance. If you don't receive a letter, please call our office to schedule the follow-up appointment.  Any Other Special Instructions Will Be Listed Below (If Applicable).     If you need a refill on your cardiac medications before your next appointment, please call your pharmacy.

## 2016-04-29 NOTE — Progress Notes (Signed)
OFFICE NOTE  Chief Complaint:  Follow-up postural hypotension  Primary Care Physician: Gwen PoundsUSSO,[name], MD  HPI:  Elizabeth Mcintosh is a pleasant 70 year old female kindly referred to me by Dr. Arbutus Leasat with neurology for evaluation of postural hypotension and autonomic dysfunction. 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 Unfortunately she has autonomic involvement and his had problems with hypotension and dizziness as well as positional presyncopal symptoms. She's been prescribed a lower extremity compression stockings in an abdominal binder and there is discussion about whether or not we need to start medications in order to help with blood pressure. She's been found to be orthostatic in the past and blood pressure today is actually quite low at 84/59 in the office. She's never had a true syncopal event however that is likely to happen in the near future. She denies any chest pain or worsening shortness of breath. She's never had an echocardiogram.  I saw Elizabeth Mcintosh back in the office today. She underwent an echocardiogram which shows normal LV systolic function and mild diastolic dysfunction. This is reassuring that her orthostatic hypotension is not related to a significant cardiomyopathy. I do believe is related to autonomic dysfunction. We talked about options for management and I feel that she may be a good candidate for the relatively new medicine Northera (droxidopa).   Elizabeth Mcintosh returns today for follow-up of her autonomic postural hypotension. She is accompanied by her daughter who lives in [place]. She did see her neurologist earlier this morning. To this point, she has been placed on Droxidopa and the dose has been titrated up to 600 mg 3 times a day. This is max dose therapy and should provide the most benefit. We have seen a favorable response in blood pressure. At the last office visit we were almost unable to get a blood pressure at rest in a seated  position, but today her blood pressure was 139/78. Unfortunately, she continues to have some syncopal episodes which her daughter feels are more frequent, but the episode should be less likely given the increase in blood pressure. She just recently got compression stockings and is starting to wear them. In addition she saw her primary care provider who recommended adding fludrocortisone to her current regimen. I agree with this therapy and the dose could be uptitrated to 0.2 and even 0.3 mg. She was also advised to work on adequate hydration and liberalize salt in her diet. There was also discussion between her and her primary care provider about advanced directives. I think this is an appropriate discussion although it does not necessarily mean end-of-life care, she should consider the fact that this disease seems to be progressing and that mortality is of fairly high to my understanding.  I the pleasure of seeing Elizabeth Mcintosh back today in the office. She is accompanied by her husband. She recently had a fall for which she was taken to the emergency department and had to have sutures. This was reportedly a "mechanical fall". While walking up stairs. It may have to do with her abnormal gait. The family is noted that she's had significant improvement in her symptoms with the addition of fludrocortisone and Droxidopa. She has not had any more syncopal episodes. She continues to have significant swings in blood pressure with some blood pressures in the 90s systolic and other blood pressures as high as 200. She seems to be asymptomatic with this.  Elizabeth Mcintosh returns today in the office for follow-up. She  continues to have lability in her blood pressures. Blood pressure today was 80/50 in the office however she did not feel orthostatic. 4 choices she's had no further falls. She denies any recurrent syncope. At times she may have some significant hypertension as well. There does not appear to be a clear pattern of  hypertension related to Droxidopa, therefore I continued her on the highest tolerated dose that medication. She is also on fludrocortisone and could potentially have increases in that medicine although as her symptoms have stabilized I would like to keep her on her current dose. She went to [facility] for a second opinion on her diagnosis and saw a neurologist and is awaiting another visit with an additional neurologic specialist (? 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 She has been seen in the past year at [facility] for a second opinion. They recommended discontinuing pramipexole and increasing her Sinemet. She has also had an increase in her fludrocortisone. She has had a good response with this - no further syncope since then and improvement in blood pressure. Unfortunately, she has developed incontinence.  PMHx:  Past Medical History:  Diagnosis Date  . Aneurysm (HCC)    s/p clips  . Glaucoma   . Hypotension   . Parkinson's disease (HCC)   . Sleep apnea     Past Surgical History:  Procedure Laterality Date  . aneurym clipping    . CRANIOTOMY      FAMHx:  Family History  Problem Relation Age of Onset  . Dementia Mother   . Heart attack Father   . Kidney failure Father   . Stroke Brother   . Cancer Brother     Prostate  . Diabetes Brother   . Aneurysm Brother     SOCHx:   reports that she has quit smoking. She has never used smokeless tobacco. She reports that she drinks about 4.2 oz of alcohol per week . She reports that she does not use drugs.  ALLERGIES:  Allergies  Allergen Reactions  . Penicillins Rash    *childhood allergy* Has patient had a PCN reaction causing immediate rash, facial/tongue/throat swelling, SOB or lightheadedness with hypotension: Yes Has patient had a PCN reaction causing severe rash involving mucus membranes or skin necrosis: No Has patient had a PCN reaction that required  hospitalization No Has patient had a PCN reaction occurring within the last 10 years: No If all of the above answers are "NO", then may proceed with Cephalosporin use.     ROS: Pertinent items noted in HPI and remainder of comprehensive ROS otherwise negative.  HOME MEDS: Current Outpatient Prescriptions  Medication Sig Dispense Refill  . bimatoprost (LUMIGAN) 0.01 % SOLN Place 1 drop into both eyes at bedtime.    . carbidopa-levodopa (SINEMET CR) 50-200 MG tablet TAKE 1 TABLET BY MOUTH AT BEDTIME 30 tablet 5  . Carbidopa-Levodopa ER (SINEMET CR) 25-100 MG tablet controlled release Take 2 tablets by mouth daily. Take 2 tablets at 8 am, noon, and 4 pm    . diphenhydrAMINE (BENADRYL) 50 MG capsule Take 50 mg by mouth at bedtime.     Marland Kitchen FLUDROCORTISONE ACETATE PO Take 0.1 mg by mouth daily. Take 2 tablets    . FLUDROCORTISONE ACETATE PO Take 0.1 mg by mouth daily. Take at 1 pm    . fluticasone (FLONASE) 50 MCG/ACT nasal spray Place 2 sprays into both nostrils at bedtime as needed.  4  . Lysine  500 MG CAPS Take 1 capsule by mouth daily.    . Melatonin 5 MG TABS Take 5 mg by mouth at bedtime.    Marland Kitchen. MYRBETRIQ 25 MG TB24 tablet Take 25 mg by mouth at bedtime.     . NORTHERA 300 MG CAPS TAKE 2 CAPSULES BY MOUTH THREE TIMES DAILY 180 capsule 10  . Potassium Chloride ER 20 MEQ TBCR Take 20 mEq by mouth daily.   0   No current facility-administered medications for this visit.     LABS/IMAGING: No results found for this or any previous visit (from the past 48 hour(s)). No results found.  WEIGHTS: Wt Readings from Last 3 Encounters:  04/29/16 138 lb 3.2 oz (62.7 kg)  03/11/16 147 lb 3.2 oz (66.8 kg)  02/27/16 143 lb (64.9 kg)    VITALS: BP 136/86   Pulse 82   Ht 5' 8.5" (1.74 m)   Wt 138 lb 3.2 oz (62.7 kg)   BMI 20.71 kg/m   EXAM: General appearance: alert and no distress Lungs: clear to auscultation bilaterally Heart: regular rate and rhythm, S1, S2 normal, no murmur, click, rub  or gallop Extremities: extremities normal, atraumatic, no cyanosis or edema Skin: Skin color, texture, turgor normal. No rashes or lesions Neurologic: Mental status: Alert, oriented, thought content appropriate, Mask facies, does seem somewhat more animated today  EKG: Deferred  ASSESSMENT: 1. Autonomic postural hypotension 2. 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 Depression 4. Syncope  PLAN: 1.   Mrs. Ivery has had some improvement in her blood pressures with increases in fludrocortisone and Sinemet. She has not had further syncopal episodes. Unfortunately, this is likely temporary as her disease will likely progress. I'm happy to see her back annually or sooner if necessary.  Chrystie NoseKenneth C. Hilty, MD, [facility] Attending Cardiologist [facility]  Chrystie NoseKenneth C Hilty 04/29/2016, 8:31 PM

## 2016-06-24 NOTE — Progress Notes (Deleted)
Elizabeth Mcintosh was seen today in the movement disorders clinic for neurologic consultation at the request of [name],[name], MD.  The consultation is for the evaluation of abnormal gait and "awkwardness" and weakness on the R side.  Pt states that they were away in [place] and [place] in October, 2014 and she felt like she just didn't bounce back after that.  She just felt "slow" after.  In Jan, she went to ortho to get an injection in her shoulder b/c of an impingement syndrome but still felt slow after PT, although she did good in PT.  She tried to d/c all the meds that she thought that she thought would interfere and could cause slowness - benadryl and unisom.  She feels that her right arm doesn't swing.  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  11/22/13 update:  Pt returns today for f/u, accompanied by her daughter who supplements the history.  The patient was diagnosed with Parkinson's disease last visit.  We started levodopa.  The patient reports that she is moving easier.  Her friends noticed that she is more alert.  She did her physical therapies and is planning on starting a cardiovascular exercise program, but has not figured out how exactly to do that or what to do.  She and her daughter bring a long list of questions.  Her daughter asks me about recognition of depression.  The patient denies this, but her daughter states that she does not think that the patient would be able to recognize it, and her daughter thinks that there is some depression.  The patient is very active, especially in regards to travel.  She will be traveling to [place] in July and then will be traveling again overseas in October.  She asks me about refilling her Ambien for insomnia.  Her daughter asks me about drinking alcohol.  Last visit, the patient reported that she drank about 3 or 4 glasses of wine per night, each totaling 6-8 ounces.  No falls.  No hallucinations.  No lightheadedness.    02/22/14  update:  Pt accompanied by her son, who supplements the history.  Pt saw Dr. Conley Canal since last visit.  No evidence of dementia.  Believed had adjustment d/o related to dx of PD and pt agreed to go to counseling with Dr. Conley Canal.  Dr. Conley Canal also discussed with her tapering alcohol use, as I have also.  Pt states that she was told that she could still have 3 drinks per night.   She has increased her carbidopa/levodopa 25/100 to 2 in the AM, 1 in the afternoon, 1 in the evening.  Pt states that she has more energy after going up in the AM.  Her BP was really low today, and she states that it "is always low."  She is sometimes dizzy but it is not bad.  She is getting up in the middle of the night frequently to use the bathroom and cannot sleep.  She wonders if the klonopin contributes to that.  She is exercising more; went 5 days last week.    06/27/14 update:  The patient is following up today.  She is accompanied by her husband who supplements the history.  She is currently on carbidopa/levodopa 25/100, 2 tablets in the morning, one in the afternoon and one in the evening.  Pramipexole was started last visit and worked up to 0.5 g 3 times a day.  She stopped  this and ended up restarting it in November.  She has not noticed a huge difference with this medication.  She did call me in November with an episode of vision change where everything went bright.  It lasted for about 7-10 minutes and she felt somewhat dizzy.  She is not able to have an MRI because of prior aneurysm clips.  A CT of the brain was done and did not reveal any changes.  A carotid ultrasound demonstrated less than 50% stenosis bilaterally.  She was just evaluated for PT/OT/ST on 06/08/2014.  She has been doing a lot of travel - [place], [place], [place] and she did well with a lot of walking.  Her husband states that she did pretty well.  She is working out with a Systems analyst with at [employer].  No falls but feels that she is "lurching"  forward.  Mood is good.  Trouble sleeping.  States that Dr. Timothy Lasso increased her klonopin to 1.5 tablets at night.  Still drinking 2 glasses wine per night but that is down from previously.  10/26/14 update:  The patient is following up today, accompanied by her daughter who supplements the history.  She is on carbidopa/levodopa 25/100, 2 in the morning, one in the afternoon and one in the evening along with pramipexole, 0.5 mg 3 times per day.  She states that she misses one lunchtime pill a week because she forgets. Pt states that she fell 2 days ago.   She was quickly walking into the kitchen and fell into the drying rack in front of the washer and she fell into her shoulder.  No LOC.  Her husband helped her get back up.  Her daughter thinks that she had another fall not long after our last visit; she stepped outside on the ice and went out to get the paper and fell.  She has just finished PT/OT/ST and she states that she learned strategies and she learned exercises.  She has a Systems analyst at the [facility] that she sees once per week and she goes 3 other times throughout the week.  She is on a combination of clonazepam and 3 mg of melatonin for insomnia.  She states that is working well.  She states that her PCP started myrbetriq since last visit and it has helped her bladder.  She does state that she is having cramping of the feet and legs about 3 nights a week.  She continues to drink a glass of wine at lunchtime and 1 "small" glass of liquor in the evening.  She has developed some diplopia and is getting new glasses.  She states that she will not have prisms in the glasses.  12/28/14 update:  The patient is following up today.  She is accompanied by her son and daughter who supplement the history.   She is on carbidopa/levodopa 25/100, 2 tablets in the morning, one in the afternoon and one in the evening in addition to pramipexole, 0.5 mg 3 times per day.  Last visit, I also added carbidopa/levodopa 50/200 at  night because she was complaining about toe curling.  This really helped that problem.   Since our last visit, however, there have been multiple calls from the patient as well as from one of her physician friends complaining about "spells."  These spells will consist of having fragmented vision as if she is looking through a prism and then she feels like she is moving slowly.  She had a CTA of the  head and neck because of these.  It was essentially unremarkable.  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  There was no evidence of recurrent aneurysm.  Her physician friend called me on 11/07/2014 to tell me that he had witnessed these episodes.  The patient complained about bright flashing lights before the episode and then she seemed a little rigid and sunk to the ground.  He was not sure if there was loss of consciousness as she just sunk to the ground, but he did note that when he spoke to her on the ground, she was completely awake and aware.  She had an EEG that did not demonstrate any seizures and then had an ambulatory EEG that just demonstrated temporal delta, without epileptiform activity.  She does have a cardiac consult pending on 01/04/2015.   Her daughter states that a "spell" seems to start with a swaying and then the body seems to be unable to support her.  She seems slow to communicate but is able.  It seems unrelated to dosing timing.  After an episode she seems to need to be in bed all day.  There may be some lightheadedness.  If she sees a prism of light, it lasts a few min and doesn't occur before but during an episode.  Today, they say there is never LOC.  Her face seems white during an episode.  An episode never happens while seated.  An episode is happening in clusters.  A week ago, she had 3 in a day and then this past week she had 1 episode.    Besides for the episodes, her daughter states that they have seen a steep physical decline.  They have seen more  shuffling.  The right leg feels like a "peg leg."   Daughter relates that in June she fell hard on concrete and hit her head and had a very large knot on her posterior occiput and refused to go and get it evaluated.   She is still exercising but hasn't been the last 2 weeks because she hasn't been feeling as well.  She is drinking a glass of wine a day.  She is still on klonopin, 1.5 tablets at night (increased in Jan by Dr. Timothy Lasso).  02/28/15 update:  The patient has a history of multiple system atrophy.  She is accompanied by her husband who supplements the history.    She is on carbidopa/levodopa 25/100, 2 tablets in the morning, one the afternoon and one in the evening as well as pramipexole 0.5 mg 3 times per day.  She had a CT of the brain last visit that was negative.  Last visit, I felt that she was likely having "spells" because of orthostatic hypotension although she was not orthostatic in my office.  I did give her a prescription for an abdominal binder as well as compression stockings.  She is wearing the binder today but couldn't get on the stockings.  She ultimately saw Dr. Rennis Golden and I reviewed his records and am appreciative of his input.  The patient was orthostatic in his office so he felt that a tilt table was not indicated.  He did start her on Northera.  She is now on 600 mg tid.  She ended up in the emergency room on 01/31/2015 at [facility] after a fall.  She did not have a "spell" but she just missed a step.  She missed a step and fell backwards into her  head.  She had another CT of the brain done and there was no evidence of intracerebral bleeding or hemorrhage although there was a moderate sized posterior scalp contusion.  A CT of the spine was done as well and was negative.  They placed staples in her head because of the scalp contusion.  Her blood pressure was apparently significantly elevated initially but it was much lower upon discharge.  She has been taking her BP regularly at home and  it has been low sometimes but not too high.  She has had a few other minor falls.  She is still going to the gym with a trainer 2 times a week and is going two other times a week.  No choking on foods.  Admits to intermittent diplopia with reading.    03/30/15 update:  The patient is following up today, accompanied by her husband who supplements the history.  I have spoken to her primary care physician over the telephone about her as well.  She is on carbidopa/levodopa 25/100, 2 tablets in the morning, one in the afternoon and one in the evening.  She is on pramipexole 0.5 mg, one tablet tid.  I asked her to hold her carbidopa/levodopa 50/200 at bedtime last visit but she didn't do that and is still on that.  Unfortunately, since our last visit she has had multiple falls (states more than she can count) and then a syncopal episode on October 13.  She had a CT of the brain after fall in October 6 that was nonacute.  She had a normal follow-up with her primary care physician on October 13 and her blood pressure was very low with systolic in the 60s.  She was told to go home and hydrate, but unfortunately she ended up going home and having a syncopal episode.  In the emergency room, she was given fluids and her blood pressures were actually quite high, as high as 181/80.  No low blood pressures were recorded in the emergency room as far as I can tell.  She was told by her primary care physician, the emergency room and myself to discontinue alcohol.  States that she hasn't had a drink since that ER visit.  She remains on Northera, 600 mg 3 times per day.  States that she was started on florinef earlier this week and husband feels that she is much better.  States that she was having fainting spells 2 times a day and none since starting the meds (not all sound like syncope and some perhaps near syncope).  Her PCP reports that pt decided on DNR and LOC orders filled out with him.  Husband just bought her a walker at  [employer] and he asks me about WC.  [facility] would [facility]  06/21/15 update:  Patient follows up today, accompanied by her husband who supplements history.  I have reviewed prior records made available to me.  The patient is on carbidopa/levodopa 25/100, 2 tablets in the morning, one in the afternoon, one in the evening and carbidopa/levodopa 50/200 at night.  I asked her decrease her pramipexole last visit from 0.5 mg 3 times a day to 0.25 mg 3 times a day because of orthostasis and syncope.  She is not sure if she did that or not.   She continues on northera, 600 mg 3 times per day for this along with florinef 0.1 mg, 1-1/2 tablets daily.  She has done much better with this, and has not had  further syncope.  She did go to the emergency room on 05/06/2015 after a mechanical fall on her stairs.  A ct of the brain was negative.  She has not fallen since then.  She has had some near falls.  She has a PT re-eval upcoming in Feb.   She did ask me last visit for a [facility] opinion [facility] and has an appointment on 07/09/2015 [facility].  Her husband states that a friend of theirs [facility] PD [facility] and has had DBS and thinks that this is the best place to go.  Asks me why I think that she has MSA and not PD.  10/24/15 update:  Patient follows up today, accompanied by her husband who supplements history.  I have reviewed prior records made available to me.  The patient is on carbidopa/levodopa 25/100, 2 tablets in the morning, one in the afternoon, one in the evening and carbidopa/levodopa 50/200 at night.  She is on pramipexole 0.25 mg 3 times a day, which is a lower dosage because of orthostasis and syncope.  She continues on northera, 600 mg 3 times per day for this along with florinef 0.1 mg, 1-1/2 tablets daily.  She has done much better with this, in addition to medications.  However, her daughter called yesterday and stated that the patient had several episodes  this past weekend where her BP would drop and she would "go limp" (no LOC per pt) and one time her L arm shook and one time the R arm shook.  These have been worked up extensively in the past and determined all likely due to BP drops and significant orthostasis. Pt does describe that on Friday, she was sitting at dinner and looked up and she saw lights.  She then had head heaviness and she felt that her body would "curl up" and her family thought that it was her BP lowering so they had her "march in place" in her seat.  She then went back to the hotel without further incident.  The following day, she sat down and then stood up and fell.  She couldn't get up and states that she think that she passed out.  States that sometimes when she has a "sinking spell" her arms and legs will shake, but she is awake when that happens.  It is both arms and legs according to pt/husband.  Her husband states that "if I get her feet up in the recliner, she never has the spells."   Her husband is going to [place] for 3 months but they have hired a caregiver during the day and her son is coming at night.   She [facility].  I reviewed his records but he really said nothing new other than to refer her to [facility].  She is going to see Dr. Fidela Juneauooney on 07/03/15.  She asked me again today about alcohol.  She is drinking daily, one glass of wine.  Her husband states that it depends on who is pouring it that will determine the size of that class.  The patient insists that is very small.  02/26/16 update:  Pt f/u today, accompanied by her husband who supplements the history.  The records that were made available to me were reviewed. Saw Dr. Fidela Juneauooney on 11/01/15 for another opinion.  He had her increase her carbidopa/levodopa 25/100, to 2 po tid and continue carbidopa/levodopa 50/200 q  hs.  Had her decrease her d/c her pramipexole.  States that she was all excited about d/c it, but had lots of  RLS and more of the hypotensive spells, even off of the medication.  She went back on pramipexole, 0.25 mg, 1/2 tablet at night but husband states that he thought it was discontinued when she went to the subacute nursing facility.  However, she is back on it at 0.5 mg 3 times per day.  On both northera and florinef for OH.   Went to the ED/admitted on 02/19/16.  Had MS change with mild aphasia and right hemianopia.  While in the hospital, pt had hypotensive episode 30 min after husband gave her florinef and carbidopa/levodopa with associated MS change.  Was given IV fluids and her BP and MS improved.  Had the following tests in hospital:    CT head without contrast, personally reviewed by me 02/21/2016 1. No acute hemorrhage or evidence of acute cortical infarct. 2. Unchanged right frontal encephalomalacia and postsurgical changes of prior aneurysm clipping.  2D Echocardiogram  pending - Left ventricle: The cavity size was normal. There was mild focal basal hypertrophy of the septum. Systolic function was normal. The estimated ejection fraction was in the range of 55% to 60%. Wall motion was normal; there were no regional wall motion abnormalities. Left ventricular diastolic function parameters were normal.  EEG  02/21/2016 This awake and asleepEEG is abnormal due to the presence of: 1. Moderatediffuse slowing of the waking background 2. Additional focal slowing over the right hemisphere, maximal over the right temporal region  She had an LDL 127 and was started on lipitor and ASA was changed to plavix 75 mg daily.  She was d/c to [facility] on 02/22/16.  Pts husband states that last night the SNF called and wanted to give her something to decrease the blood pressure.  Reviewing her medications from [facility], multiple medication changes have been made.  She is now on carpet the/levodopa 25/100, 2 tablets in the morning, one in the afternoon and one in the evening (instead of 23  times per day) and remains on carpet the/levodopa 50/200 at night.  The immediate release levodopa was changed to oral bisacodyl tablets.  Her husband states at home she was taking Florinef 0.1 mg, 2 tablets in the morning and an additional tablet 5 hours later.  This is not being given at the subacute nursing facility.  The patient reports that she generally wears her abdominal compression binder, but did not today.  Husband is concerned that she could get better care/therapy at home and is not sure how long we will keep her in [facility].  06/26/16 update:  The patient follows up today, on carbidopa/levodopa 25/100, 2 tablets 3 times per day and carbidopa/levodopa 50/200 at night.  Last visit, we decreased her pramipexole to 0.25 mg at night only.  She states that ***.  She does have very significant orthostatic hypotension associated with multiple system atrophy.  Because of that, she is on Florinef 0.1 mg, 2 tablets in the morning and an additional tablet 5 hours later.  This is in addition to her Northera, 600 mg 3 times a day.  She saw Dr. Rennis Golden last on 04/29/2016.  PREVIOUS MEDICATIONS: none to date  ALLERGIES:   Allergies  Allergen Reactions  . Penicillins Rash    *childhood allergy* Has patient had a PCN reaction causing immediate rash, facial/tongue/throat swelling, SOB or lightheadedness with hypotension: Yes Has patient had a  PCN reaction causing severe rash involving mucus membranes or skin necrosis: No Has patient had a PCN reaction that required hospitalization No Has patient had a PCN reaction occurring within the last 10 years: No If all of the above answers are "NO", then may proceed with Cephalosporin use.     CURRENT MEDICATIONS:  Current Outpatient Prescriptions on File Prior to Visit  Medication Sig Dispense Refill  . bimatoprost (LUMIGAN) 0.01 % SOLN Place 1 drop into both eyes at bedtime.    . carbidopa-levodopa (SINEMET CR) 50-200 MG tablet TAKE 1 TABLET BY MOUTH AT  BEDTIME 30 tablet 5  . Carbidopa-Levodopa ER (SINEMET CR) 25-100 MG tablet controlled release Take 2 tablets by mouth daily. Take 2 tablets at 8 am, noon, and 4 pm    . diphenhydrAMINE (BENADRYL) 50 MG capsule Take 50 mg by mouth at bedtime.     Marland Kitchen FLUDROCORTISONE ACETATE PO Take 0.1 mg by mouth daily. Take 2 tablets    . FLUDROCORTISONE ACETATE PO Take 0.1 mg by mouth daily. Take at 1 pm    . fluticasone (FLONASE) 50 MCG/ACT nasal spray Place 2 sprays into both nostrils at bedtime as needed.  4  . Lysine 500 MG CAPS Take 1 capsule by mouth daily.    . Melatonin 5 MG TABS Take 5 mg by mouth at bedtime.    Marland Kitchen MYRBETRIQ 25 MG TB24 tablet Take 25 mg by mouth at bedtime.     . NORTHERA 300 MG CAPS TAKE 2 CAPSULES BY MOUTH THREE TIMES DAILY 180 capsule 10  . Potassium Chloride ER 20 MEQ TBCR Take 20 mEq by mouth daily.   0   No current facility-administered medications on file prior to visit.     PAST MEDICAL HISTORY:   Past Medical History:  Diagnosis Date  . Aneurysm (HCC)    s/p clips  . Glaucoma   . Hypotension   . Parkinson's disease (HCC)   . Sleep apnea     PAST SURGICAL HISTORY:   Past Surgical History:  Procedure Laterality Date  . aneurym clipping    . CRANIOTOMY      SOCIAL HISTORY:   Social History   Social History  . Marital status: Married    Spouse name: N/A  . Number of children: N/A  . Years of education: N/A   Occupational History  . retired     Copy, Music therapist   Social History Main Topics  . Smoking status: Former Games developer  . Smokeless tobacco: Never Used     Comment: quit 40 years ago  . Alcohol use 4.2 oz/week    7 Glasses of wine per week     Comment: 1-2 glasses wine/day (6-8 oz in each glass)  . Drug use: No  . Sexual activity: Yes   Other Topics Concern  . Not on file   Social History Narrative  . No narrative on file    FAMILY HISTORY:   Family Status  Relation Status  . Mother Deceased at age 40   fall  . Father Deceased at  age 80   CAD, renal failure  . Brother Deceased   CVA, MI after hip replacement  . Brother Alive   bladder CA  . Child Alive   healthy  . Brother   . Brother   . Brother   . Brother     ROS:  A complete 10 system review of systems was obtained and was unremarkable apart from what is mentioned  above.  PHYSICAL EXAMINATION:    VITALS:   There were no vitals filed for this visit. No data found.     GEN:  The patient appears stated age and is in NAD.   HEENT:  Normocephalic, atraumatic.  The mucous membranes are moist. The superficial temporal arteries are without ropiness or tenderness. CV:  RRR Lungs:  CTAB.  She does have some inspiratory gasps. Neck/HEME:  There are no carotid bruits bilaterally.  Neurological examination:  Orientation: The patient is alert and oriented x3. Fund of knowledge is appropriate.  Recent and remote memory are intact.  Attention and concentration are normal.    Able to name objects and repeat phrases. Cranial nerves: There is good facial symmetry.   Extraocular muscles are intact.  There are no square wave jerks.  The visual fields are full to confrontational testing. The speech is fluent and clear. Soft palate rises symmetrically and there is no tongue deviation. Hearing is intact to conversational tone. Sensation: Sensation is intact to light and pinprick throughout (facial, trunk, extremities). Vibration is intact at the bilateral big toe. There is no extinction with double simultaneous stimulation. There is no sensory dermatomal level identified.   Motor: Strength is 5/5 in the bilateral upper and lower extremities.   Shoulder shrug is equal and symmetric.  There is no pronator drift.   Movement examination: Tone: There is mild increased tone in the RUE Abnormal movements: There is no tremor, even with distraction techniques. Coordination:  There is decremation with RAM's on the right Gait and Station: walks slowly down the hall with good  stride length and decreased arm swing and slightly drags the right leg   ASSESSMENT/PLAN:  1.  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   -pt and husband had multiple questions today re: MSA.    - We wrote orders to her home dosing and changed her from the oral including tablets to her home immediate release carbidopa/levodopa 25/100 2 tablets 3 times per day  -Decrease Mirapex to her home dosing so she is just taking 0.25 mg at night.  When tried to d/c she had more RLS  -Continue carbidopa/levodopa 50/200 which seems to be helping nighttime cramping  -Needs to wear abdominal compression binder at all times and I wrote an order for [facility] in this regard.  I also asked them not to treat a systolic blood pressure over 200 unless it was sustained.  I asked them to take off the abdominal compression binder if the blood pressure was over 200 and recheck it in an hour, so long as she was asymptomatic.  -If the patient leaves [facility], she needs 24/7 care with an increase in caregiver time.  She needs to move the bedroom downstairs.  She needs to stay seated at all times, unless someone is directly walking with her and holding her.  2.  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  -had significant hypotensive spell with MS change in hospital after husband administered her both florinef and carbidopa/levodopa 25/100, so I suspect, as I have all along, that "sinking spells" that she has had for a long time are also related to hypotension.  3.  Possible cerebral infarct  -unable to do MRI so nonconfirmative.  I also think it is very probable that she just had significant hypotension that led to hypoperfusion,  and possibly an infarct as a result.  -On lipitor for LDL 127 and plavix 75 mg daily (was on ASA prior).  Understands risk of  antiplatelets esp given fall risk.  However, she really should not be walking alone any longer.  No melena or hematochezia.   4.  Urinary frequency  -saw gyn.  On Myrbetriq, which she finds intermittently helpful.  5.  Sleep difficulty  -She is off of her clonazepam.  6. Follow up is anticipated in the next 3 months, sooner should new neurologic issues arise.   Much greater than 50% of this visit was spent in counseling with the patient and the family.  Total face to face time:  40 min

## 2016-06-26 ENCOUNTER — Ambulatory Visit: Payer: Medicare Other | Admitting: Neurology

## 2016-07-10 DEATH — deceased

## 2016-07-29 ENCOUNTER — Other Ambulatory Visit: Payer: Self-pay | Admitting: Neurology

## 2017-05-15 IMAGING — CT CT HEAD W/O CM
4 series · 16 of 47 positions shown, 18 images · non-contrast
Comparison: Head CT 02/19/2016

CLINICAL DATA: History of Parkinson's disease. Follow-up stroke.
Status post aneurysm clipping.

EXAM:
CT HEAD WITHOUT CONTRAST
TECHNIQUE: Contiguous axial images were obtained from the base of the skull
through the vertex without intravenous contrast.

[Series 2: head without · axial · non-contrast · 0.48mm/px · z∈[-45,+80]mm · 7 of 35 slices shown, 9 images]
[im 5/35  brain]
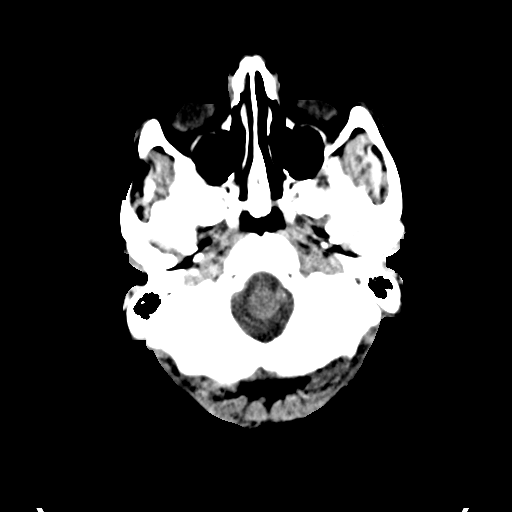
[im 5/35  bone]
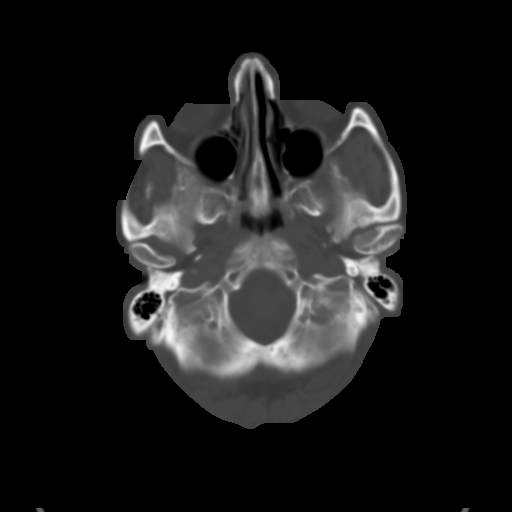
[im 9/35  brain]
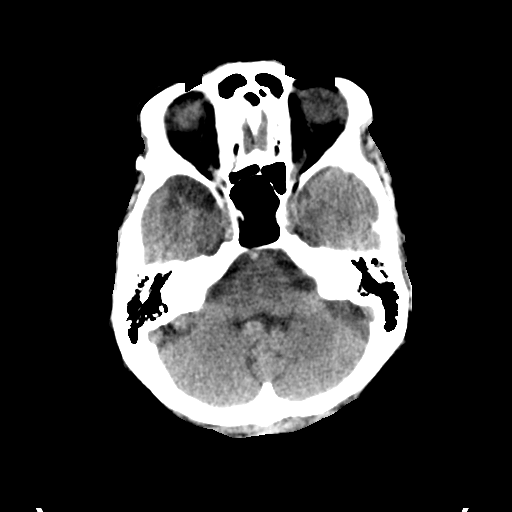
[im 13/35  brain]
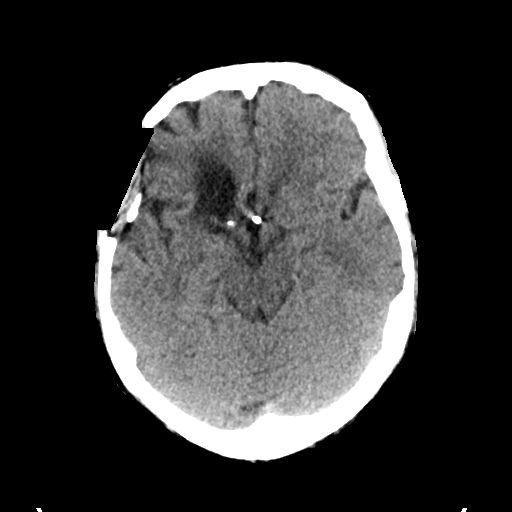
[im 18/35  brain]
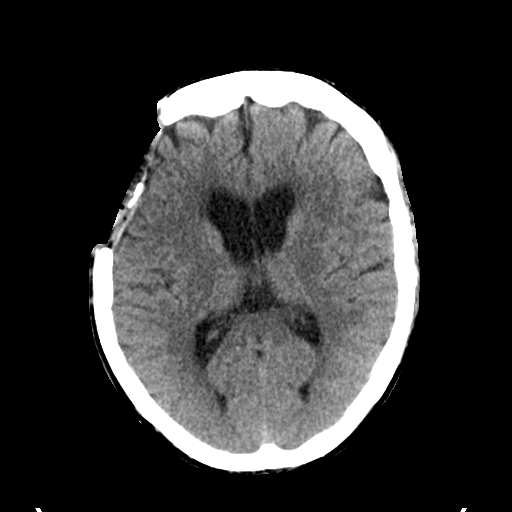
[im 22/35  brain]
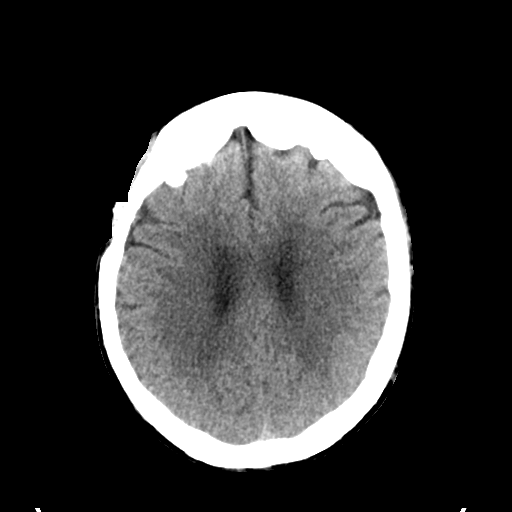
[im 22/35  bone]
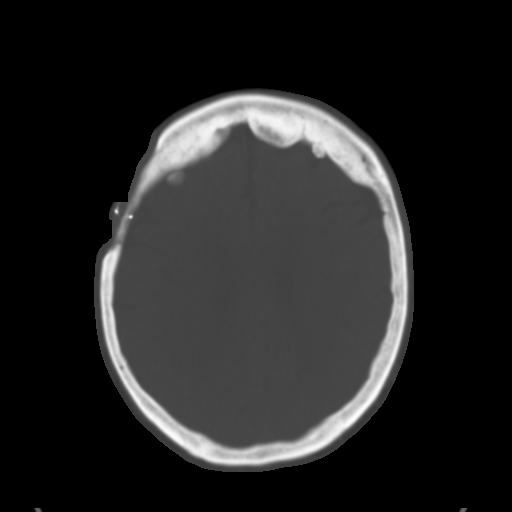
[im 26/35  brain]
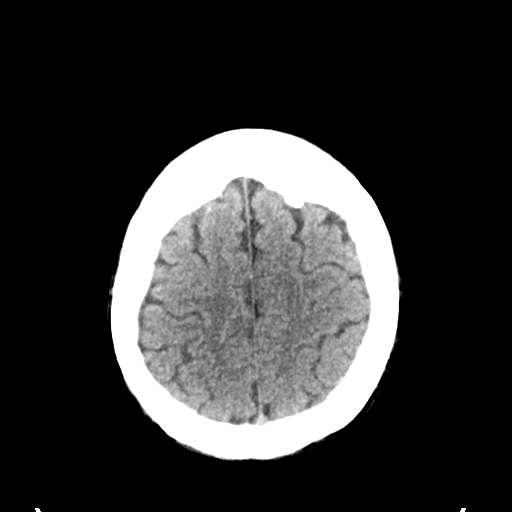
[im 30/35  brain]
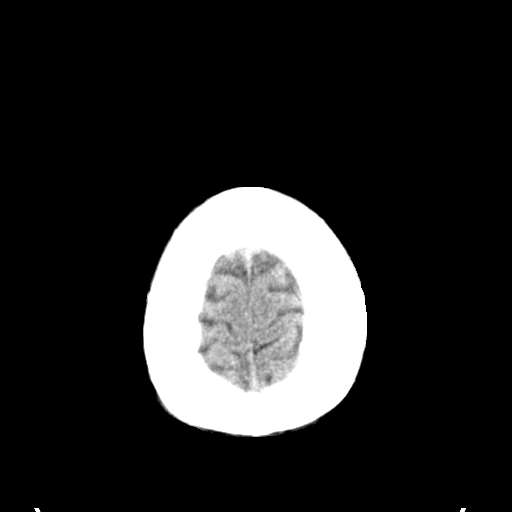

[Series 3: head bone · axial · 0.48mm/px · z∈[-49,-13]mm · 3 of 88 slices shown]
[im 9/88  bone]
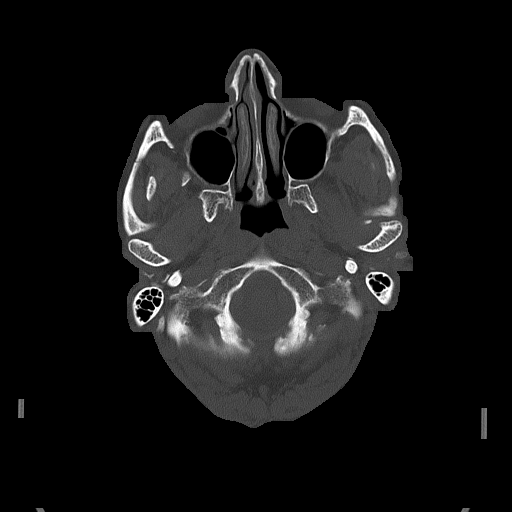
[im 18/88  bone]
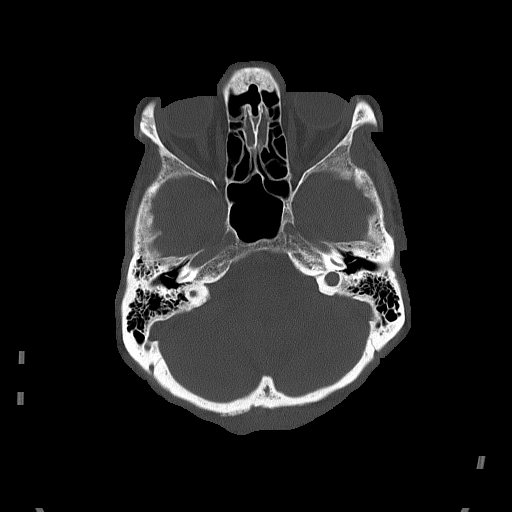
[im 27/88  bone]
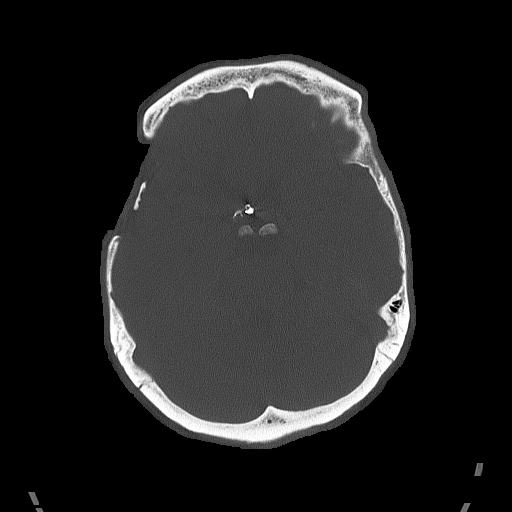

[Series 4: head without cor · coronal · non-contrast · 0.34mm/px · 3 of 72 slices shown]
[im 24/72  brain]
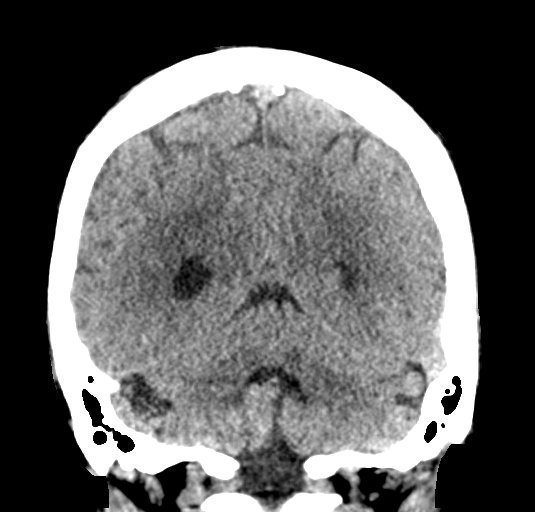
[im 32/72  brain]
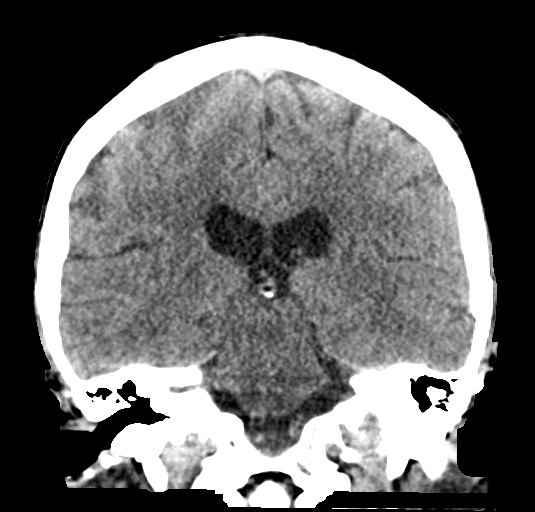
[im 40/72  brain]
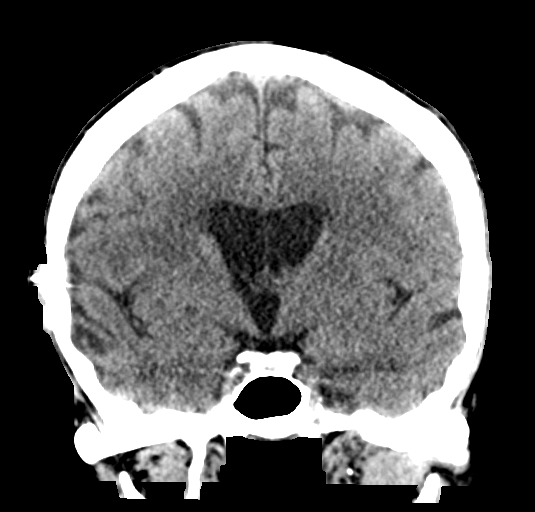

[Series 5: head without sag · sagittal · non-contrast · 0.34mm/px · 3 of 60 slices shown]
[im 20/60  brain]
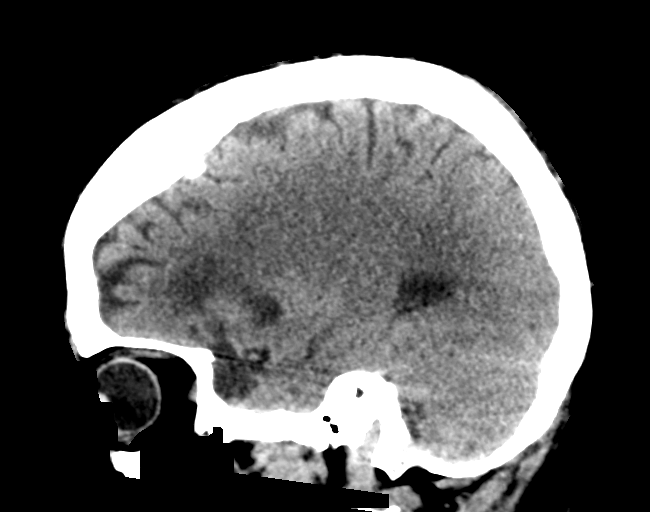
[im 30/60  brain]
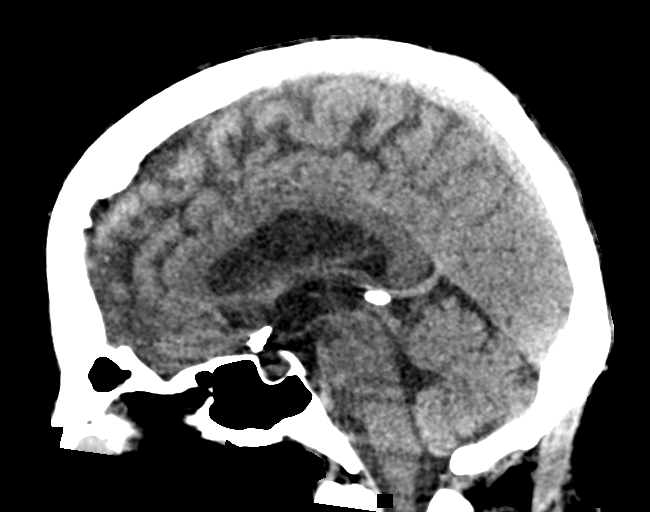
[im 40/60  brain]
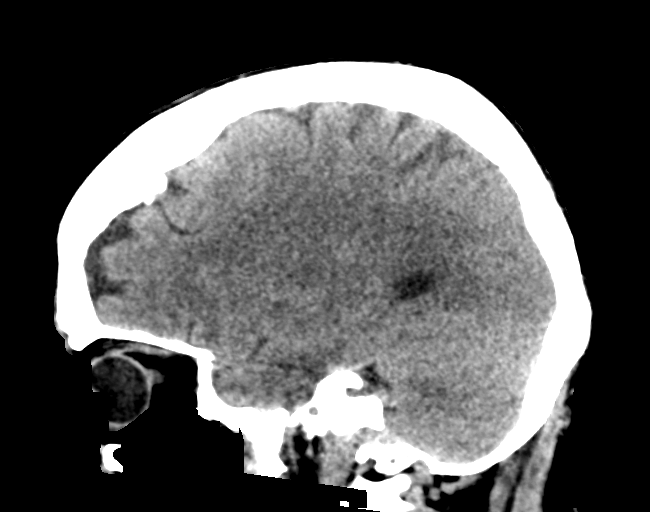

[16 of 47 positions shown; findings below may reference images not displayed]

FINDINGS: Brain: Area of hypoattenuation in the right frontal periventricular
white matter is unchanged. There is no acute hemorrhage. No midline
shift or mass effect. No evidence of acute cortical infarct.

Vascular: Aneurysm clip present over glue across [place]-Erich is again
noted.

Skull: Status post remote right pterional craniectomy.

Sinuses/Orbits: No fluid levels or advanced mucosal thickening.
Normal orbits.

Other: None
IMPRESSION: 1. No acute hemorrhage or evidence of acute cortical infarct.
2. Unchanged right frontal encephalomalacia and postsurgical changes
of prior aneurysm clipping.

## 2019-03-16 ENCOUNTER — Encounter: Payer: Self-pay | Admitting: Gynecology
